# Patient Record
Sex: Female | Born: 1955 | Race: White | Hispanic: No | Marital: Married | Smoking: Never smoker
Health system: Southern US, Community
[De-identification: ages and names within clinical notes are randomized; demographics above are authoritative.]

## PROBLEM LIST (undated history)

## (undated) DIAGNOSIS — F329 Major depressive disorder, single episode, unspecified: Secondary | ICD-10-CM

## (undated) DIAGNOSIS — M170 Bilateral primary osteoarthritis of knee: Secondary | ICD-10-CM

## (undated) DIAGNOSIS — I10 Essential (primary) hypertension: Secondary | ICD-10-CM

## (undated) DIAGNOSIS — E669 Obesity, unspecified: Secondary | ICD-10-CM

## (undated) DIAGNOSIS — N6310 Unspecified lump in the right breast, unspecified quadrant: Secondary | ICD-10-CM

## (undated) DIAGNOSIS — J3089 Other allergic rhinitis: Secondary | ICD-10-CM

## (undated) DIAGNOSIS — F32A Depression, unspecified: Secondary | ICD-10-CM

## (undated) DIAGNOSIS — S060X0A Concussion without loss of consciousness, initial encounter: Secondary | ICD-10-CM

## (undated) DIAGNOSIS — E785 Hyperlipidemia, unspecified: Secondary | ICD-10-CM

## (undated) DIAGNOSIS — M2550 Pain in unspecified joint: Secondary | ICD-10-CM

## (undated) DIAGNOSIS — M79671 Pain in right foot: Secondary | ICD-10-CM

## (undated) DIAGNOSIS — J45909 Unspecified asthma, uncomplicated: Secondary | ICD-10-CM

## (undated) DIAGNOSIS — Z1211 Encounter for screening for malignant neoplasm of colon: Secondary | ICD-10-CM

## (undated) DIAGNOSIS — H8110 Benign paroxysmal vertigo, unspecified ear: Secondary | ICD-10-CM

## (undated) HISTORY — DX: Unspecified lump in the right breast, unspecified quadrant: N63.10

## (undated) HISTORY — DX: Bilateral primary osteoarthritis of knee: M17.0

## (undated) HISTORY — DX: Depression, unspecified: F32.A

## (undated) HISTORY — DX: Obesity, unspecified: E66.9

## (undated) HISTORY — PX: OTHER SURGICAL HISTORY: SHX169

## (undated) HISTORY — DX: Other allergic rhinitis: J30.89

## (undated) HISTORY — DX: Pain in unspecified joint: M25.50

## (undated) HISTORY — DX: Encounter for screening for malignant neoplasm of colon: Z12.11

## (undated) HISTORY — DX: Major depressive disorder, single episode, unspecified: F32.9

## (undated) HISTORY — DX: Essential (primary) hypertension: I10

## (undated) HISTORY — DX: Benign paroxysmal vertigo, unspecified ear: H81.10

## (undated) HISTORY — DX: Concussion without loss of consciousness, initial encounter: S06.0X0A

## (undated) HISTORY — PX: ROTATOR CUFF REPAIR: SHX139

## (undated) HISTORY — DX: Pain in right foot: M79.671

## (undated) HISTORY — PX: BREAST BIOPSY: SHX20

## (undated) HISTORY — PX: TOTAL KNEE ARTHROPLASTY: SHX125

## (undated) HISTORY — DX: Unspecified asthma, uncomplicated: J45.909

## (undated) HISTORY — DX: Hyperlipidemia, unspecified: E78.5

---

## 1999-09-20 ENCOUNTER — Encounter (INDEPENDENT_AMBULATORY_CARE_PROVIDER_SITE_OTHER): Payer: Self-pay

## 1999-09-20 ENCOUNTER — Other Ambulatory Visit: Admission: RE | Admit: 1999-09-20 | Discharge: 1999-09-20 | Payer: Self-pay | Admitting: Gynecology

## 1999-12-25 ENCOUNTER — Emergency Department (HOSPITAL_COMMUNITY): Admission: EM | Admit: 1999-12-25 | Discharge: 1999-12-25 | Payer: Self-pay | Admitting: Emergency Medicine

## 1999-12-25 ENCOUNTER — Encounter: Payer: Self-pay | Admitting: Emergency Medicine

## 2002-06-03 ENCOUNTER — Other Ambulatory Visit: Admission: RE | Admit: 2002-06-03 | Discharge: 2002-06-03 | Payer: Self-pay | Admitting: Obstetrics and Gynecology

## 2003-06-08 ENCOUNTER — Other Ambulatory Visit: Admission: RE | Admit: 2003-06-08 | Discharge: 2003-06-08 | Payer: Self-pay | Admitting: Obstetrics and Gynecology

## 2004-01-24 ENCOUNTER — Ambulatory Visit: Payer: Self-pay | Admitting: Internal Medicine

## 2004-03-16 ENCOUNTER — Ambulatory Visit: Payer: Self-pay | Admitting: Internal Medicine

## 2004-03-22 ENCOUNTER — Ambulatory Visit: Payer: Self-pay | Admitting: Internal Medicine

## 2004-04-04 ENCOUNTER — Ambulatory Visit: Payer: Self-pay | Admitting: Internal Medicine

## 2004-06-19 ENCOUNTER — Ambulatory Visit: Payer: Self-pay | Admitting: Internal Medicine

## 2004-07-26 ENCOUNTER — Other Ambulatory Visit: Admission: RE | Admit: 2004-07-26 | Discharge: 2004-07-26 | Payer: Self-pay | Admitting: Obstetrics and Gynecology

## 2005-01-25 ENCOUNTER — Ambulatory Visit: Payer: Self-pay | Admitting: Internal Medicine

## 2005-03-01 ENCOUNTER — Ambulatory Visit: Payer: Self-pay | Admitting: Internal Medicine

## 2005-04-05 ENCOUNTER — Ambulatory Visit: Payer: Self-pay | Admitting: Internal Medicine

## 2005-05-24 ENCOUNTER — Ambulatory Visit: Payer: Self-pay | Admitting: Internal Medicine

## 2005-09-27 ENCOUNTER — Ambulatory Visit: Payer: Self-pay | Admitting: Internal Medicine

## 2005-10-11 ENCOUNTER — Ambulatory Visit: Payer: Self-pay | Admitting: Internal Medicine

## 2005-10-17 ENCOUNTER — Ambulatory Visit: Payer: Self-pay | Admitting: Family Medicine

## 2005-12-17 ENCOUNTER — Ambulatory Visit: Payer: Self-pay | Admitting: Internal Medicine

## 2006-01-24 ENCOUNTER — Ambulatory Visit: Payer: Self-pay | Admitting: Internal Medicine

## 2006-03-01 ENCOUNTER — Ambulatory Visit: Payer: Self-pay | Admitting: Internal Medicine

## 2006-03-29 ENCOUNTER — Ambulatory Visit: Payer: Self-pay | Admitting: Internal Medicine

## 2006-07-04 ENCOUNTER — Ambulatory Visit: Payer: Self-pay | Admitting: Internal Medicine

## 2006-08-06 ENCOUNTER — Ambulatory Visit: Payer: Self-pay | Admitting: Internal Medicine

## 2006-09-17 DIAGNOSIS — J309 Allergic rhinitis, unspecified: Secondary | ICD-10-CM | POA: Insufficient documentation

## 2006-09-22 DIAGNOSIS — R197 Diarrhea, unspecified: Secondary | ICD-10-CM | POA: Insufficient documentation

## 2006-09-23 ENCOUNTER — Telehealth: Payer: Self-pay | Admitting: Internal Medicine

## 2006-09-26 ENCOUNTER — Ambulatory Visit: Payer: Self-pay | Admitting: Internal Medicine

## 2006-09-26 DIAGNOSIS — I10 Essential (primary) hypertension: Secondary | ICD-10-CM | POA: Insufficient documentation

## 2006-09-26 DIAGNOSIS — F329 Major depressive disorder, single episode, unspecified: Secondary | ICD-10-CM | POA: Insufficient documentation

## 2006-09-26 DIAGNOSIS — N951 Menopausal and female climacteric states: Secondary | ICD-10-CM | POA: Insufficient documentation

## 2006-09-26 LAB — CONVERTED CEMR LAB
ALT: 35 units/L (ref 0–35)
AST: 31 units/L (ref 0–37)
Albumin: 2.9 g/dL — ABNORMAL LOW (ref 3.5–5.2)
Alkaline Phosphatase: 66 units/L (ref 39–117)
Basophils Absolute: 0 10*3/uL (ref 0.0–0.1)
Basophils Relative: 0.8 % (ref 0.0–1.0)
Bilirubin, Direct: 0.2 mg/dL (ref 0.0–0.3)
Eosinophils Absolute: 0.1 10*3/uL (ref 0.0–0.6)
Eosinophils Relative: 2.6 % (ref 0.0–5.0)
HCT: 35.5 % — ABNORMAL LOW (ref 36.0–46.0)
Hemoglobin: 12.5 g/dL (ref 12.0–15.0)
Lymphocytes Relative: 36.5 % (ref 12.0–46.0)
MCHC: 35.1 g/dL (ref 30.0–36.0)
MCV: 86.8 fL (ref 78.0–100.0)
Monocytes Absolute: 0.5 10*3/uL (ref 0.2–0.7)
Monocytes Relative: 10 % (ref 3.0–11.0)
Neutro Abs: 2.6 10*3/uL (ref 1.4–7.7)
Neutrophils Relative %: 50.1 % (ref 43.0–77.0)
Platelets: 245 10*3/uL (ref 150–400)
RBC: 4.09 M/uL (ref 3.87–5.11)
RDW: 12.2 % (ref 11.5–14.6)
Total Bilirubin: 0.9 mg/dL (ref 0.3–1.2)
Total Protein: 6.4 g/dL (ref 6.0–8.3)
WBC: 5.1 10*3/uL (ref 4.5–10.5)

## 2006-09-27 DIAGNOSIS — M25569 Pain in unspecified knee: Secondary | ICD-10-CM | POA: Insufficient documentation

## 2006-09-30 ENCOUNTER — Ambulatory Visit: Payer: Self-pay | Admitting: Internal Medicine

## 2006-12-09 ENCOUNTER — Ambulatory Visit: Payer: Self-pay | Admitting: Internal Medicine

## 2006-12-31 ENCOUNTER — Ambulatory Visit: Payer: Self-pay | Admitting: Internal Medicine

## 2006-12-31 DIAGNOSIS — L408 Other psoriasis: Secondary | ICD-10-CM | POA: Insufficient documentation

## 2006-12-31 DIAGNOSIS — E785 Hyperlipidemia, unspecified: Secondary | ICD-10-CM | POA: Insufficient documentation

## 2007-01-30 ENCOUNTER — Encounter: Payer: Self-pay | Admitting: Internal Medicine

## 2007-02-07 ENCOUNTER — Ambulatory Visit: Payer: Self-pay | Admitting: Internal Medicine

## 2007-02-21 ENCOUNTER — Ambulatory Visit: Payer: Self-pay | Admitting: Internal Medicine

## 2007-02-21 DIAGNOSIS — J069 Acute upper respiratory infection, unspecified: Secondary | ICD-10-CM | POA: Insufficient documentation

## 2007-03-11 ENCOUNTER — Telehealth (INDEPENDENT_AMBULATORY_CARE_PROVIDER_SITE_OTHER): Payer: Self-pay | Admitting: *Deleted

## 2007-03-27 ENCOUNTER — Ambulatory Visit: Payer: Self-pay | Admitting: Internal Medicine

## 2007-04-21 ENCOUNTER — Telehealth: Payer: Self-pay | Admitting: Internal Medicine

## 2007-05-16 ENCOUNTER — Ambulatory Visit: Payer: Self-pay | Admitting: Internal Medicine

## 2007-06-16 ENCOUNTER — Ambulatory Visit: Payer: Self-pay | Admitting: Internal Medicine

## 2007-06-24 ENCOUNTER — Encounter: Payer: Self-pay | Admitting: Internal Medicine

## 2007-06-24 ENCOUNTER — Telehealth: Payer: Self-pay | Admitting: Internal Medicine

## 2007-08-08 ENCOUNTER — Ambulatory Visit: Payer: Self-pay | Admitting: Internal Medicine

## 2007-08-08 DIAGNOSIS — M549 Dorsalgia, unspecified: Secondary | ICD-10-CM | POA: Insufficient documentation

## 2007-09-18 ENCOUNTER — Ambulatory Visit: Payer: Self-pay | Admitting: Internal Medicine

## 2007-09-18 DIAGNOSIS — R609 Edema, unspecified: Secondary | ICD-10-CM | POA: Insufficient documentation

## 2007-09-25 ENCOUNTER — Encounter: Payer: Self-pay | Admitting: Internal Medicine

## 2007-09-25 ENCOUNTER — Ambulatory Visit: Payer: Self-pay

## 2007-10-15 ENCOUNTER — Ambulatory Visit: Payer: Self-pay | Admitting: Internal Medicine

## 2007-10-15 DIAGNOSIS — I83229 Varicose veins of left lower extremity with both ulcer of unspecified site and inflammation: Secondary | ICD-10-CM

## 2007-10-15 DIAGNOSIS — I83219 Varicose veins of right lower extremity with both ulcer of unspecified site and inflammation: Secondary | ICD-10-CM | POA: Insufficient documentation

## 2007-10-15 DIAGNOSIS — L97919 Non-pressure chronic ulcer of unspecified part of right lower leg with unspecified severity: Secondary | ICD-10-CM | POA: Insufficient documentation

## 2007-10-15 DIAGNOSIS — L97929 Non-pressure chronic ulcer of unspecified part of left lower leg with unspecified severity: Secondary | ICD-10-CM

## 2008-01-12 ENCOUNTER — Telehealth: Payer: Self-pay | Admitting: Internal Medicine

## 2008-01-20 ENCOUNTER — Ambulatory Visit: Payer: Self-pay | Admitting: Internal Medicine

## 2008-03-31 ENCOUNTER — Telehealth: Payer: Self-pay | Admitting: Internal Medicine

## 2008-03-31 ENCOUNTER — Emergency Department (HOSPITAL_COMMUNITY): Admission: EM | Admit: 2008-03-31 | Discharge: 2008-03-31 | Payer: Self-pay | Admitting: Emergency Medicine

## 2008-04-01 ENCOUNTER — Ambulatory Visit: Payer: Self-pay | Admitting: Internal Medicine

## 2008-04-01 DIAGNOSIS — S060X0A Concussion without loss of consciousness, initial encounter: Secondary | ICD-10-CM

## 2008-04-01 DIAGNOSIS — M542 Cervicalgia: Secondary | ICD-10-CM | POA: Insufficient documentation

## 2008-04-01 HISTORY — DX: Concussion without loss of consciousness, initial encounter: S06.0X0A

## 2008-06-02 ENCOUNTER — Ambulatory Visit: Payer: Self-pay | Admitting: Internal Medicine

## 2008-08-02 ENCOUNTER — Ambulatory Visit: Payer: Self-pay | Admitting: Internal Medicine

## 2008-08-02 DIAGNOSIS — H1045 Other chronic allergic conjunctivitis: Secondary | ICD-10-CM | POA: Insufficient documentation

## 2008-09-14 ENCOUNTER — Telehealth (INDEPENDENT_AMBULATORY_CARE_PROVIDER_SITE_OTHER): Payer: Self-pay | Admitting: *Deleted

## 2008-09-15 ENCOUNTER — Ambulatory Visit: Payer: Self-pay | Admitting: Internal Medicine

## 2008-09-15 LAB — CONVERTED CEMR LAB
ALT: 27 units/L (ref 0–35)
AST: 22 units/L (ref 0–37)
Alkaline Phosphatase: 62 units/L (ref 39–117)
Basophils Relative: 1.9 % (ref 0.0–3.0)
Bilirubin Urine: NEGATIVE
Bilirubin, Direct: 0 mg/dL (ref 0.0–0.3)
Calcium: 8.5 mg/dL (ref 8.4–10.5)
Cholesterol: 222 mg/dL — ABNORMAL HIGH (ref 0–200)
Creatinine, Ser: 0.6 mg/dL (ref 0.4–1.2)
Eosinophils Relative: 6 % — ABNORMAL HIGH (ref 0.0–5.0)
Hemoglobin: 11.9 g/dL — ABNORMAL LOW (ref 12.0–15.0)
Lymphocytes Relative: 30.1 % (ref 12.0–46.0)
Monocytes Relative: 9.7 % (ref 3.0–12.0)
Neutro Abs: 2.9 10*3/uL (ref 1.4–7.7)
Neutrophils Relative %: 52.3 % (ref 43.0–77.0)
Nitrite: NEGATIVE
Protein, U semiquant: NEGATIVE
RBC: 3.88 M/uL (ref 3.87–5.11)
Sodium: 142 meq/L (ref 135–145)
Total CHOL/HDL Ratio: 4
Total Protein: 7.1 g/dL (ref 6.0–8.3)
Triglycerides: 88 mg/dL (ref 0.0–149.0)
Urobilinogen, UA: 0.2
VLDL: 17.6 mg/dL (ref 0.0–40.0)
WBC: 5.4 10*3/uL (ref 4.5–10.5)

## 2008-09-22 ENCOUNTER — Ambulatory Visit: Payer: Self-pay | Admitting: Internal Medicine

## 2008-10-13 ENCOUNTER — Telehealth: Payer: Self-pay | Admitting: Internal Medicine

## 2008-12-30 ENCOUNTER — Ambulatory Visit: Payer: Self-pay | Admitting: Internal Medicine

## 2009-03-07 ENCOUNTER — Ambulatory Visit: Payer: Self-pay | Admitting: Internal Medicine

## 2009-04-12 ENCOUNTER — Encounter: Payer: Self-pay | Admitting: Internal Medicine

## 2009-05-27 ENCOUNTER — Ambulatory Visit: Payer: Self-pay | Admitting: Internal Medicine

## 2009-05-27 ENCOUNTER — Telehealth: Payer: Self-pay | Admitting: Internal Medicine

## 2009-05-27 DIAGNOSIS — H53429 Scotoma of blind spot area, unspecified eye: Secondary | ICD-10-CM | POA: Insufficient documentation

## 2009-05-30 ENCOUNTER — Encounter: Payer: Self-pay | Admitting: Internal Medicine

## 2009-06-06 ENCOUNTER — Ambulatory Visit: Payer: Self-pay | Admitting: Internal Medicine

## 2009-06-06 DIAGNOSIS — H47019 Ischemic optic neuropathy, unspecified eye: Secondary | ICD-10-CM | POA: Insufficient documentation

## 2009-06-07 ENCOUNTER — Telehealth: Payer: Self-pay | Admitting: Internal Medicine

## 2009-06-10 ENCOUNTER — Encounter: Payer: Self-pay | Admitting: Internal Medicine

## 2009-06-22 ENCOUNTER — Encounter: Payer: Self-pay | Admitting: Internal Medicine

## 2009-07-04 ENCOUNTER — Telehealth: Payer: Self-pay | Admitting: Internal Medicine

## 2009-07-05 ENCOUNTER — Ambulatory Visit: Payer: Self-pay | Admitting: Internal Medicine

## 2009-08-08 ENCOUNTER — Ambulatory Visit: Payer: Self-pay | Admitting: Family Medicine

## 2009-09-05 ENCOUNTER — Ambulatory Visit: Payer: Self-pay | Admitting: Internal Medicine

## 2009-10-12 ENCOUNTER — Ambulatory Visit: Payer: Self-pay | Admitting: Family Medicine

## 2009-12-19 ENCOUNTER — Ambulatory Visit: Payer: Self-pay | Admitting: Family Medicine

## 2010-01-13 ENCOUNTER — Ambulatory Visit: Payer: Self-pay | Admitting: Internal Medicine

## 2010-01-18 ENCOUNTER — Ambulatory Visit: Payer: Self-pay | Admitting: Internal Medicine

## 2010-01-18 DIAGNOSIS — J45991 Cough variant asthma: Secondary | ICD-10-CM | POA: Insufficient documentation

## 2010-01-24 ENCOUNTER — Telehealth: Payer: Self-pay | Admitting: Internal Medicine

## 2010-03-03 ENCOUNTER — Ambulatory Visit
Admission: RE | Admit: 2010-03-03 | Discharge: 2010-03-03 | Payer: Self-pay | Source: Home / Self Care | Attending: Internal Medicine | Admitting: Internal Medicine

## 2010-03-03 DIAGNOSIS — R059 Cough, unspecified: Secondary | ICD-10-CM | POA: Insufficient documentation

## 2010-03-03 DIAGNOSIS — R05 Cough: Secondary | ICD-10-CM | POA: Insufficient documentation

## 2010-03-03 HISTORY — DX: Cough, unspecified: R05.9

## 2010-03-20 ENCOUNTER — Encounter: Payer: Self-pay | Admitting: Internal Medicine

## 2010-03-26 LAB — CONVERTED CEMR LAB: Pap Smear: NORMAL

## 2010-03-30 NOTE — Progress Notes (Signed)
Summary: need results  Phone Note Call from Patient Call back at Home Phone [phone] Call back at Work Phone [phone]   Caller: Patient----voicemail Reason for Call: Lab or Test Results Summary of Call: was seen yesterday. Need her sed rate results. Initial call taken by: Warnell Forester,  June 07, 2009 1:49 PM  Follow-up for Phone Call        pt informed -per dr Lovell Sheehan- no prednisone, probably what eye doc thought it was, Follow-up by: Willy Eddy, LPN,  June 07, 2009 3:39 PM

## 2010-03-30 NOTE — Letter (Signed)
Summary: Eye Exam/[facility]  [facility]   Imported By: Maryln Gottron 06/08/2009 14:45:43  _____________________________________________________________________  External Attachment:    Type:   Image     Comment:   External Document

## 2010-03-30 NOTE — Assessment & Plan Note (Signed)
Summary: 2 MNTH ROV//SLM   Vital Signs:  Patient profile:   55 year old female Height:      66 inches Weight:      196 pounds BMI:     31.75 Temp:     98.2 degrees F oral Pulse rate:   72 / minute Resp:     14 per minute BP sitting:   122 / 80  (left arm)  Vitals Entered By: Willy Eddy, LPN (March 07, 2009 8:30 AM) CC: roa after changing bp meds to ben icar due to insurance , Hypertension Management   CC:  roa after changing bp meds to ben icar due to insurance  and Hypertension Management.  History of Present Illness: follow up visit blood pressure is good and she has lost weight her complaint todya is low back pain that is increased with she moves  ( turning over and upon arising) the pain improves with activity no injury, has lifting grand baby  Hypertension History:      She denies headache, chest pain, palpitations, dyspnea with exertion, orthopnea, PND, peripheral edema, visual symptoms, neurologic problems, syncope, and side effects from treatment.        Positive major cardiovascular risk factors include hyperlipidemia and hypertension.  Negative major cardiovascular risk factors include female age less than 6 years old and non-tobacco-user status.     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 Once Daily 2)  Multivitamins   Tabs (Multiple Vitamin) .... Once Daily 3)  Benicar Hct 20-12.5 Mg Tabs (Olmesartan Medoxomil-Hctz) .... One By Mouth Dail 4)  Lovaza 1 Gm  Caps (Omega-3-Acid Ethyl Esters) .... Occassionally 5)  Optivar 0.05 %  Soln (Azelastine Hcl) .... One Qtts Ou Bid 6)  Diprolene Af 0.05 %  Crea (Aug Betamethasone Dipropionate) .... Apply To Skin in Afected Areas 7)  Caltrate 600+d Plus 600-400 Mg-Unit  Chew (Calcium Carbonate-Vit D-Min) .... Once Daily 8)  Vitamin E 100 Unit/gm  Crea (Vitamin E) .... Once Daily 9)  Ativan 0.5 Mg  Tabs (Lorazepam) .... One By Mouth Q 6 Hour As Needed 10)  Naprelan 375 Mg  Tb24 (Naproxen Sodium) .... Two Daily 11)  Elestat 0.05 %  Soln (Epinastine Hcl) .... Use As Directed To Eyes 12)  Patanol 0.1 %  Soln (Olopatadine Hcl) .... One Qtts Ou Bid 13)  Nasonex 50 Mcg/act  Susp (Mometasone Furoate) .... One Spray Two Times A Day Q Nare 14)  Lexapro 10 Mg Tabs (Escitalopram Oxalate) .... 1/2 By Mouth Daily 15)  Asmanex 120 Metered Doses 220 Mcg/inh Aepb (Mometasone Furoate) .... One Puf Dialy 16)  Proventil Hfa 108 (90 Base) Mcg/act  Aers (Albuterol Sulfate) .... 2 Puff  By Mouth Q4  Current Medications (verified): 1)  Prempro 0.625-5 Mg  Tabs (Conj Estrog-Medroxyprogest Ace) .... Once Daily 2)  Multivitamins   Tabs (Multiple Vitamin) .... Once Daily 3)  Benicar Hct 20-12.5 Mg Tabs (Olmesartan Medoxomil-Hctz) .... One By Mouth Dail 4)  Lovaza 1 Gm  Caps (Omega-3-Acid Ethyl Esters) .... Occassionally 5)  Optivar 0.05 %  Soln (Azelastine Hcl) .... One Qtts Ou Bid 6)  Diprolene Af 0.05 %  Crea (Aug Betamethasone Dipropionate) .... Apply To Skin in Afected Areas 7)  Caltrate 600+d Plus 600-400 Mg-Unit  Chew (Calcium Carbonate-Vit D-Min) .... Once Daily 8)  Vitamin E 100 Unit/gm  Crea (Vitamin E) .... Once  Daily 9)  Ativan 0.5 Mg  Tabs (Lorazepam) .... One By Mouth Q 6 Hour As Needed 10)  Elestat 0.05 %  Soln (Epinastine Hcl) .... Use As Directed To Eyes 11)  Patanol 0.1 %  Soln (Olopatadine Hcl) .... One Qtts Ou Bid 12)  Nasonex 50 Mcg/act  Susp (Mometasone Furoate) .... One Spray Two Times A Day Q Nare 13)  Lexapro 10 Mg Tabs (Escitalopram Oxalate) .... 1/2 By Mouth Daily 14)  Asmanex 120 Metered Doses 220 Mcg/inh Aepb (Mometasone Furoate) .... One Puf Dialy 15)  Proventil Hfa 108 (90 Base) Mcg/act  Aers (Albuterol Sulfate) .... 2 Puff By Mouth Q4 16)  Chlorzoxazone 500 Mg Tabs (Chlorzoxazone) .... One By Mouth Bid 17)  Diclofenac Potassium 50 Mg Tabs (Diclofenac Potassium) .... One By Mouth Two Times A Day  Allergies (verified): No Known Drug Allergies  Past History:  Family History: Last updated: 09/26/2006 Family History of Arthritis Family History High cholesterol  Social History: Last updated: 09/26/2006 Occupation: Married Never Smoked  Risk Factors: Smoking Status: never (09/26/2006)  Past medical, surgical, family and social histories (including risk factors) reviewed, and no changes noted (except as noted below).  Past Medical History: Reviewed history from 12/31/2006 and no changes required. Allergic rhinitis Depression Hypertension Hyperlipidemia  Past Surgical History: Reviewed history from 09/26/2006 and no changes required. Caesarean section x 2 Rotator cuff repair  Family History: Reviewed history from 09/26/2006 and no changes required. Family History of Arthritis Family History High cholesterol  Social History: Reviewed history from 09/26/2006 and no changes required. Occupation: Married Never Smoked  Review of Systems  The patient denies anorexia, fever, weight loss, weight gain, vision loss, decreased hearing, hoarseness, chest pain, syncope, dyspnea on exertion, peripheral edema, prolonged cough, headaches, hemoptysis, abdominal pain,  melena, hematochezia, severe indigestion/heartburn, hematuria, incontinence, genital sores, muscle weakness, suspicious skin lesions, transient blindness, difficulty walking, depression, unusual weight change, abnormal bleeding, enlarged lymph nodes, angioedema, and breast masses.         back pain  Physical Exam  General:  Well-developed,well-nourished,in no acute distress; alert,appropriate and cooperative throughout examination Head:  normocephalic, no abnormalities observed, and no abnormalities palpated.   Eyes:  pupils equal and pupils round.   Nose:  nasal dischargemucosal pallor, mucosal erythema, and mucosal edema.   Mouth:  Oral mucosa and oropharynx without lesions or exudates.  Teeth in good repair. Neck:  No deformities, masses, or tenderness noted. Lungs:  Normal respiratory effort, chest expands symmetrically. Lungs are clear to auscultation, no crackles or wheezes. Heart:  Normal rate and regular rhythm. S1 and S2 normal without gallop, murmur, click, rub or other extra sounds. Abdomen:  Bowel sounds positive,abdomen soft and non-tender without masses, organomegaly or hernias noted. Msk:  lumbar lordosis and trigger point tenderness.   Neurologic:  alert & oriented X3  and DTRs symmetrical and normal.     Impression & Recommendations:  Problem # 1:  BACK PAIN, LEFT (ICD-724.5) Assessment Deteriorated  with spasms  The following medications were removed from the medication list:    Naprelan 375 Mg Tb24 (Naproxen sodium) .Marland Kitchen..Marland Kitchen Two daily Her updated medication list for this problem includes:    Chlorzoxazone 500 Mg Tabs (Chlorzoxazone) ..... One by mouth bid    Diclofenac Potassium 50 Mg Tabs (Diclofenac potassium) ..... One by mouth two times a day  Discussed use of moist heat or ice, modified activities, medications, and stretching/strengthening exercises. Back care instructions given. To be seen in 2 weeks if no improvement; sooner if worsening of symptoms.   Problem  # 2:  HYPERLIPIDEMIA (ICD-272.4) Assessment: Unchanged  Her updated medication list for this problem includes:    Lovaza 1 Gm Caps (Omega-3-acid ethyl esters) ..... Occassionally  Labs Reviewed: SGOT: 22 (09/15/2008)   SGPT: 27 (09/15/2008)  Prior 10 Yr Risk Heart Disease: Not enough information (03/27/2007)   HDL:62.40 (09/15/2008)  Chol:222 (09/15/2008)  Trig:88.0 (09/15/2008)  Problem # 3:  HYPERTENSION (ICD-401.9) very god results with benicar Her updated medication list for this problem includes:    Benicar Hct 20-12.5 Mg Tabs (Olmesartan medoxomil-hctz) ..... One by mouth dail  BP today: 122/80 Prior BP: 124/80 (12/30/2008)  Prior 10 Yr Risk Heart Disease: Not enough information (03/27/2007)  Labs Reviewed: K+: 3.5 (09/15/2008) Creat: : 0.6 (09/15/2008)   Chol: 222 (09/15/2008)   HDL: 62.40 (09/15/2008)   TG: 88.0 (09/15/2008)  Complete Medication List: 1)  Prempro 0.625-5 Mg Tabs (Conj estrog-medroxyprogest ace) .... Once daily 2)  Multivitamins Tabs (Multiple vitamin) .... Once daily 3)  Benicar Hct 20-12.5 Mg Tabs (Olmesartan medoxomil-hctz) .... One by mouth dail 4)  Lovaza 1 Gm Caps (Omega-3-acid ethyl esters) .... Occassionally 5)  Optivar 0.05 % Soln (Azelastine hcl) .... One qtts ou bid 6)  Diprolene Af 0.05 % Crea (Aug betamethasone dipropionate) .... Apply to skin in afected areas 7)  Caltrate 600+d Plus 600-400 Mg-unit Chew (Calcium carbonate-vit d-min) .... Once daily 8)  Vitamin E 100 Unit/gm Crea (Vitamin e) .... Once daily 9)  Ativan 0.5 Mg Tabs (Lorazepam) .... One by mouth q 6 hour as needed 10)  Elestat 0.05 % Soln (Epinastine hcl) .... Use as directed to eyes 11)  Patanol 0.1 % Soln (Olopatadine hcl) .... One qtts ou bid 12)  Nasonex 50 Mcg/act Susp (Mometasone furoate) .... One spray two times a day q nare 13)  Lexapro 10 Mg Tabs (Escitalopram oxalate) .... 1/2 by mouth daily 14)  Asmanex 120 Metered Doses 220 Mcg/inh Aepb (Mometasone furoate) ....  One puf dialy 15)  Proventil Hfa 108 (90 Base) Mcg/act Aers (Albuterol sulfate) .... 2 puff by mouth q4 16)  Chlorzoxazone 500 Mg Tabs (Chlorzoxazone) .... One by mouth bid 17)  Diclofenac Potassium 50 Mg Tabs (Diclofenac potassium) .... One by mouth two times a day  Hypertension Assessment/Plan:      The patient's hypertensive risk group is category B: At least one risk factor (excluding diabetes) with no target organ damage.  Today's blood pressure is 122/80.  Her blood pressure goal is < 140/90.  Patient Instructions: 1)  Please schedule a follow-up appointment in 3 months. Prescriptions: DICLOFENAC POTASSIUM 50 MG TABS (DICLOFENAC POTASSIUM) one by mouth two times a day  #30 x 0   Entered and Authorized by:   Stacie Glaze MD   Signed by:   Stacie Glaze MD on  03/07/2009   Method used:   Electronically to        [id number] (retail)       [address]       [place], [place]  [address]       Ph: [phone] or [phone]       Fax: [phone]   RxID:   [id number] CHLORZOXAZONE 500 MG TABS (CHLORZOXAZONE) one by mouth BID  #30 x 0   Entered and Authorized by:   Stacie Glaze MD   Signed by:   Stacie Glaze MD on 03/07/2009   Method used:   Electronically to        [id number] (retail)       [address]       [place], [place]  [address]       Ph: [phone] or [phone]       Fax: [phone]   RxID:   [id number]

## 2010-03-30 NOTE — Assessment & Plan Note (Signed)
Summary: F/U ON COUGH // RS   Vital Signs:  Patient profile:   55 year old female Height:      66 inches Weight:      202 pounds BMI:     32.72 Temp:     98.3 degrees F oral Pulse rate:   72 / minute Resp:     14 per minute BP sitting:   120 / 74  (left arm)  Vitals Entered By: Willy Eddy, LPN (January 18, 2010 3:57 PM) CC: f/u cough - had ch est xray, Hypertension Management Is Patient Diabetic? No   Primary Care Azalya Galyon:  Stacie Glaze MD  CC:  f/u cough - had ch est xray and Hypertension Management.  History of Present Illness: the pt has cough varient asthma the  pt has allergies that has been severe thje pt responded to qvar trial with decreased cough she presented off the qvar with increased cough xray was negative, she has not fever of chills    Hypertension History:      She denies headache, chest pain, palpitations, dyspnea with exertion, orthopnea, PND, peripheral edema, visual symptoms, neurologic problems, syncope, and side effects from treatment.        Positive major cardiovascular risk factors include female age 17 years old or older, hyperlipidemia, and hypertension.  Negative major cardiovascular risk factors include non-tobacco-user status.     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) 9)  Edema  (ICD-782.3) 10)  Back Pain, Left  (ICD-724.5) 11)  Uri  (ICD-465.9) 12)  Psoriasis, 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) 13)  Foot Pain, Left  (ICD-729.5) 14)  Hyperlipidemia  (ICD-272.4) 15)   Tendinitis  (ICD-726.90) 16)  Knee Pain, Acute  (ICD-719.46) 17)  Diarrhea, Presumed Infectious Origin  (ICD-009.3) 18)  Hypertension  (ICD-401.9) 19)  Depression  (ICD-311) 20)  Menopause  (ICD-627.2) 21)  Allergic Rhinitis  (ICD-477.9)  Current Problems (verified): 1)  Cough, Chronic  (ICD-786.2) 2)  Acute Bronchitis  (ICD-466.0) 3)  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) 4)  Scotoma of Blind Spot Area in [employer]  [phone]) 5)  Asthma, Unspecified, Unspecified Status  (ICD-493.90) 6)  Health Maintenance Exam  (ICD-V70.0) 7)  Conjunctivitis, Allergic, Chronic  (ICD-372.14) 8)  Neck and Back Pain  (ICD-723.1) 9)  Concussion With No Loss of Consciousness  (ICD-850.0) 10)  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 Once Daily 2)  Benicar Hct 20-12.5 Mg Tabs (Olmesartan Medoxomil-Hctz) .... One By Mouth Dail 3)  Optivar 0.05 %  Soln (Azelastine  Hcl) .... One Qtts Ou Bid 4)  Diprolene Af 0.05 %  Crea (Aug Betamethasone Dipropionate) .... Apply To Skin in Afected Areas 5)  Caltrate 600+d Plus 600-400 Mg-Unit  Chew (Calcium Carbonate-Vit D-Min) .... Once Daily 6)  Vitamin E 100 Unit/gm  Crea (Vitamin E) .... Once Daily 7)  Elestat 0.05 %  Soln (Epinastine Hcl) .... Use As Directed To Eyes 8)  Patanol 0.1 %  Soln (Olopatadine Hcl) .... One Qtts Ou Bid 9)  Nasonex 50 Mcg/act  Susp (Mometasone Furoate) .... One Spray Two Times A Day Q Nare 10)  Lexapro 10 Mg  Tabs (Escitalopram Oxalate) .... 1/2 By Mouth Daily 11)  Qvar 80 Mcg/act Aers (Beclomethasone Dipropionate) .... One Puff Two Times A Day 12)  Proventil Hfa 108 (90 Base) Mcg/act  Aers (Albuterol Sulfate) .... 2 Puff By Mouth Q4 13)  Chlorzoxazone 500 Mg Tabs (Chlorzoxazone) .... One By Mouth Bid 14)  Diclofenac Potassium 50 Mg Tabs (Diclofenac Potassium) .... One By Mouth Two Times A Day 15)  Methylprednisolone 8 Mg Tabs (Methylprednisolone) .... One By Mouth Two Times A Day For 5 Days The One By Mouth Daily Fro 5 Days The 1/2 By Mouth Daily For 10 Days 16)  Hydrocodone-Homatropine 5-1.5 Mg/55ml Syrp (Hydrocodone-Homatropine) .... One Tsp By Mouth By Mouth 4-6 Hours As Needed For Cough. 17)  Phentermine Hcl 37.5 Mg Tabs (Phentermine Hcl) .... One By Mouth Daily  Current Medications (verified): 1)  Multivitamins   Tabs (Multiple Vitamin) .... Once Daily 2)  Benicar Hct 20-12.5 Mg Tabs (Olmesartan Medoxomil-Hctz) .... One By Mouth Dail 3)  Optivar 0.05 %  Soln (Azelastine Hcl) .... One Qtts Ou Bid 4)  Diprolene Af 0.05 %  Crea (Aug Betamethasone Dipropionate) .... Apply To Skin in Afected Areas 5)  Caltrate 600+d Plus 600-400 Mg-Unit  Chew (Calcium Carbonate-Vit D-Min) .... Once Daily 6)  Vitamin E 100 Unit/gm  Crea (Vitamin E) .... Once Daily 7)  Elestat 0.05 %  Soln (Epinastine Hcl) .... Use As Directed To Eyes 8)  Patanol 0.1 %  Soln (Olopatadine Hcl) .... One Qtts Ou Bid 9)  Nasonex 50 Mcg/act  Susp (Mometasone Furoate) .... One Spray Two Times A Day Q Nare 10)  Lexapro 10 Mg Tabs (Escitalopram Oxalate) .... 1/2 By Mouth Daily 11)  Qvar 80 Mcg/act Aers (Beclomethasone Dipropionate) .... One Puff Two Times A Day 12)  Proventil Hfa 108 (90 Base) Mcg/act  Aers (Albuterol Sulfate) .... 2 Puff By Mouth Q4 13)  Chlorzoxazone 500 Mg Tabs (Chlorzoxazone) .... One By Mouth Bid 14)  Phentermine Hcl 37.5 Mg Tabs (Phentermine Hcl) .... One By Mouth Daily  Allergies (verified): No Known Drug  Allergies  Past History:  Family History: Last updated: 09/26/2006 Family History of Arthritis Family History High cholesterol  Social History: Last updated: 09/26/2006 Occupation: Married Never Smoked  Risk Factors: Smoking Status: never (01/18/2010)  Past medical, surgical, family and social histories (including risk factors) reviewed, and no changes noted (except as noted below).  Past Medical History: Reviewed history from 12/31/2006 and no changes required. Allergic rhinitis Depression Hypertension Hyperlipidemia  Past Surgical History: Reviewed history from 09/26/2006 and no changes required. Caesarean section x 2 Rotator cuff repair  Family History: Reviewed history from 09/26/2006 and no changes required. Family History of Arthritis Family History High cholesterol  Social History: Reviewed history from 09/26/2006 and no changes required. Occupation: Married Never Smoked  Review of Systems       The patient complains of hoarseness and prolonged  cough.  The patient denies anorexia, fever, weight loss, weight gain, vision loss, decreased hearing, chest pain, syncope, dyspnea on exertion, peripheral edema, headaches, hemoptysis, abdominal pain, melena, hematochezia, severe indigestion/heartburn, hematuria, incontinence, genital sores, muscle weakness, suspicious skin lesions, transient blindness, difficulty walking, depression, unusual weight change, abnormal bleeding, enlarged lymph nodes, angioedema, and breast masses.    Physical Exam  General:  Well-developed,well-nourished,in no acute distress; alert,appropriate and cooperative throughout examination Head:  Normocephalic and atraumatic without obvious abnormalities. No apparent alopecia or balding. Eyes:  pupils equal and pupils round.   Ears:  R ear normal and L ear normal.   Nose:  external erythema and nasal dischargemucosal pallor.   Mouth:  posterior lymphoid hypertrophy and postnasal drip.   Neck:   No deformities, masses, or tenderness noted. Lungs:  Normal respiratory effort, chest expands symmetrically. Lungs are clear with slight end exp wheezing Heart:  Normal rate and regular rhythm. S1 and S2 normal without gallop, murmur, click, rub or other extra sounds. Abdomen:  Bowel sounds positive,abdomen soft and non-tender without masses, organomegaly or hernias noted.   Impression & Recommendations:  Problem # 1:  COUGH VARIANT ASTHMA (ICD-493.82) Assessment New  The following medications were removed from the medication list:    Methylprednisolone 8 Mg Tabs (Methylprednisolone) ..... One by mouth two times a day for 5 days the one by mouth daily fro 5 days the 1/2 by mouth daily for 10 days Her updated medication list for this problem includes:    Qvar 80 Mcg/act Aers (Beclomethasone dipropionate) ..... One puff two times a day    Proventil Hfa 108 (90 Base) Mcg/act Aers (Albuterol sulfate) .Marland Kitchen... 2 puff by mouth q4  Problem # 2:  HYPERTENSION (ICD-401.9) Assessment: Unchanged  Her updated medication list for this problem includes:    Benicar Hct 20-12.5 Mg Tabs (Olmesartan medoxomil-hctz) ..... One by mouth dail  BP today: 120/74 Prior BP: 110/78 (12/19/2009)  Prior 10 Yr Risk Heart Disease: Not enough information (03/27/2007)  Labs Reviewed: K+: 3.5 (09/15/2008) Creat: : 0.6 (09/15/2008)   Chol: 222 (09/15/2008)   HDL: 62.40 (09/15/2008)   TG: 88.0 (09/15/2008)  Problem # 3:  ALLERGIC RHINITIS (ICD-477.9) Assessment: Deteriorated  Her updated medication list for this problem includes:    Nasonex 50 Mcg/act Susp (Mometasone furoate) ..... One spray two times a day q nare  Complete Medication List: 1)  Multivitamins Tabs (Multiple vitamin) .... Once daily 2)  Benicar Hct 20-12.5 Mg Tabs (Olmesartan medoxomil-hctz) .... One by mouth dail 3)  Optivar 0.05 % Soln (Azelastine hcl) .... One qtts ou bid 4)  Diprolene Af 0.05 % Crea (Aug betamethasone dipropionate) .... Apply  to skin in afected areas 5)  Caltrate 600+d Plus 600-400 Mg-unit Chew (Calcium carbonate-vit d-min) .... Once daily 6)  Vitamin E 100 Unit/gm Crea (Vitamin e) .... Once daily 7)  Elestat 0.05 % Soln (Epinastine hcl) .... Use as directed to eyes 8)  Patanol 0.1 % Soln (Olopatadine hcl) .... One qtts ou bid 9)  Nasonex 50 Mcg/act Susp (Mometasone furoate) .... One spray two times a day q nare 10)  Lexapro 10 Mg Tabs (Escitalopram oxalate) .... 1/2 by mouth daily 11)  Qvar 80 Mcg/act Aers (Beclomethasone dipropionate) .... One puff two times a day 12)  Proventil Hfa 108 (90 Base) Mcg/act Aers (Albuterol sulfate) .... 2 puff by mouth q4 13)  Chlorzoxazone 500 Mg Tabs (Chlorzoxazone) .... One by mouth bid 14)  Phentermine Hcl 37.5 Mg Tabs (Phentermine hcl) .... One by  mouth daily  Hypertension Assessment/Plan:      The patient's hypertensive risk group is category B: At least one risk factor (excluding diabetes) with no target organ damage.  Today's blood pressure is 120/74.  Her blood pressure goal is < 140/90.  Patient Instructions: 1)  Please schedule a follow-up appointment in 4 months.   Orders Added: 1)  Est. Patient Level IV [56213]

## 2010-03-30 NOTE — Assessment & Plan Note (Signed)
Summary: FLU LIKE SYMPTOMS//SL[facility]   Vital Signs:  Patient profile:   55 year old female O2 Sat:      95 % Temp:     98.7 degrees F oral Pulse rate:   67 / minute Pulse rhythm:   regular BP sitting:   92 / 70  (left arm) Cuff size:   large  Vitals Entered By: Sid Falcon LPN (August 08, 2009 8:53 AM) CC: Cough, congestion, swollen neck glands   History of Present Illness: One week ago upper back pain-pt thought muscular.  Last Monday nasal congestion, sore throat, and cough.  Took hydrocodone cough syrup without improvement.  Cough prod of green sputum. Nonsmoker.  hx of asthma.  No daily controller meds.  Allergies (verified): No Known Drug Allergies  Past History:  Past Medical History: Last updated: 12/31/2006 Allergic rhinitis Depression Hypertension Hyperlipidemia  Review of Systems  The patient denies fever, weight loss, dyspnea on exertion, peripheral edema, prolonged cough, and hemoptysis.    Physical Exam  General:  Well-developed,well-nourished,in no acute distress; alert,appropriate and cooperative throughout examination Ears:  External ear exam shows no significant lesions or deformities.  Otoscopic examination reveals clear canals, tympanic membranes are intact bilaterally without bulging, retraction, inflammation or discharge. Hearing is grossly normal bilaterally. Mouth:  Oral mucosa and oropharynx without lesions or exudates.  Teeth in good repair. Neck:  No deformities, masses, or tenderness noted. Lungs:  Normal respiratory effort, chest expands symmetrically. Lungs are clear to auscultation, no crackles or wheezes. Heart:  Normal rate and regular rhythm. S1 and S2 normal without gallop, murmur, click, rub or other extra sounds. Extremities:  No clubbing, cyanosis, edema, or deformity noted with normal full range of motion of all joints.     Impression & Recommendations:  Problem # 1:  ACUTE BRONCHITIS (ICD-466.0) Assessment New cough med and Z-max  prescribed. Her updated medication list for this problem includes:    Asmanex 120 Metered Doses 220 Mcg/inh Aepb (Mometasone furoate) ..... One puf dialy    Proventil Hfa 108 (90 Base) Mcg/act Aers (Albuterol sulfate) .Marland Kitchen... 2 puff by mouth q4    Azithromycin 250 Mg Tabs (Azithromycin) .Marland Kitchen... 2 by mouth today then one by mouth once daily for 4 days    Hydrocodone-homatropine 5-1.5 Mg/5ml Syrp (Hydrocodone-homatropine) ..... One tsp by mouth by mouth 4-6 hours as needed for cough.  Complete Medication List: 1)  Prempro 0.625-5 Mg Tabs (Conj estrog-medroxyprogest ace) .... Once daily 2)  Multivitamins Tabs (Multiple vitamin) .... Once daily 3)  Benicar Hct 20-12.5 Mg Tabs (Olmesartan medoxomil-hctz) .... One by mouth dail 4)  Lovaza 1 Gm Caps (Omega-3-acid ethyl esters) .... Occassionally 5)  Optivar 0.05 % Soln (Azelastine hcl) .... One qtts ou bid 6)  Diprolene Af 0.05 % Crea (Aug betamethasone dipropionate) .... Apply to skin in afected areas 7)  Caltrate 600+d Plus 600-400 Mg-unit Chew (Calcium carbonate-vit d-min) .... Once daily 8)  Vitamin E 100 Unit/gm Crea (Vitamin e) .... Once daily 9)  Ativan 0.5 Mg Tabs (Lorazepam) .... One by mouth q 6 hour as needed 10)  Elestat 0.05 % Soln (Epinastine hcl) .... Use as directed to eyes 11)  Patanol 0.1 % Soln (Olopatadine hcl) .... One qtts ou bid 12)  Nasonex 50 Mcg/act Susp (Mometasone furoate) .... One spray two times a day q nare 13)  Lexapro 10 Mg Tabs (Escitalopram oxalate) .... 1/2 by mouth daily 14)  Asmanex 120 Metered Doses 220 Mcg/inh Aepb (Mometasone furoate) .... One puf dialy 15)  Proventil Hfa 108 (90 Base) Mcg/act Aers (Albuterol sulfate) .... 2 puff by mouth q4 16)  Chlorzoxazone 500 Mg Tabs (Chlorzoxazone) .... One by mouth bid 17)  Diclofenac Potassium 50 Mg Tabs (Diclofenac potassium) .... One by mouth two times a day 18)  Methylprednisolone 8 Mg Tabs (Methylprednisolone) .... One by mouth two times a day for 5 days the one by  mouth daily fro 5 days the 1/2 by mouth daily for 10 days 19)  Azithromycin 250 Mg Tabs (Azithromycin) .... 2 by mouth today then one by mouth once daily for 4 days 20)  Hydrocodone-homatropine 5-1.5 Mg/29ml Syrp (Hydrocodone-homatropine) .... One tsp by mouth by mouth 4-6 hours as needed for cough.  Patient Instructions: 1)  Acute Bronchitis symptoms for less then 10 days are not  helped by antibiotics. Take over the counter cough medications. Call if no improvement in 5-7 days, sooner if increasing cough, fever, or new symptoms ( shortness of breath, chest pain) .  Prescriptions: HYDROCODONE-HOMATROPINE 5-1.5 MG/5ML SYRP (HYDROCODONE-HOMATROPINE) one tsp by mouth by mouth 4-6 hours as needed for cough.  #120 ml x 0   Entered and Authorized by:   Evelena Peat MD   Signed by:   Evelena Peat MD on 08/08/2009   Method used:   Print then Give to Patient   RxID:   [id number] AZITHROMYCIN 250 MG TABS (AZITHROMYCIN) 2 by mouth today then one by mouth once daily for 4 days  #6 x 0   Entered and Authorized by:   Evelena Peat MD   Signed by:   Evelena Peat MD on 08/08/2009   Method used:   Electronically to        [id number] (retail)       [address]       [place], [place]  [address]       Ph: [phone]       Fax: [phone]   RxID:   [id number]

## 2010-03-30 NOTE — Assessment & Plan Note (Signed)
Summary: allergies/bmw   Vital Signs:  Patient profile:   55 year old female Height:      86 inches Weight:      196 pounds BMI:     18.70 Temp:     98.2 degrees F oral Pulse rate:   76 / minute Resp:     14 per minute BP sitting:   122 / 80  (left arm)  Vitals Entered By: Willy Eddy, LPN (Jul 05, 2009 11:20 AM) CC: c/o seasonal allergies   CC:  c/o seasonal allergies.  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) 2)  Scotoma of Blind Spot Area in [employer]  [phone]) 3)  Asthma, Unspecified, Unspecified Status  (ICD-493.90) 4)  Health Maintenance Exam  (ICD-V70.0) 5)  Conjunctivitis, Allergic, Chronic  (ICD-372.14) 6)  Neck and Back Pain  (ICD-723.1) 7)  Concussion With No Loss of Consciousness  (ICD-850.0) 8)  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) 9)  Edema  (ICD-782.3) 10)  Back Pain, Left  (ICD-724.5) 11)  Uri  (ICD-465.9) 12)  Psoriasis, 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) 13)  Foot Pain, Left  (ICD-729.5) 14)  Hyperlipidemia  (ICD-272.4) 15)  Tendinitis  (ICD-726.90) 16)  Knee Pain, Acute  (ICD-719.46) 17)  Diarrhea, Presumed Infectious Origin  (ICD-009.3) 18)  Hypertension  (ICD-401.9) 19)  Depression  (ICD-311) 20)  Menopause  (ICD-627.2) 21)  Allergic Rhinitis  (ICD-477.9)  Current Problems (verified): 1)  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) 2)  Scotoma of Blind Spot Area in [employer]  [phone]) 3)  Asthma, Unspecified, Unspecified Status  (ICD-493.90) 4)  Health Maintenance Exam  (ICD-V70.0) 5)  Conjunctivitis, Allergic, Chronic   (ICD-372.14) 6)  Neck and Back Pain  (ICD-723.1) 7)  Concussion With No Loss of Consciousness  (ICD-850.0) 8)  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 Once Daily 2)  Multivitamins   Tabs (Multiple Vitamin) .... Once Daily 3)  Benicar Hct 20-12.5 Mg Tabs (Olmesartan Medoxomil-Hctz) .... One By Mouth Dail 4)  Lovaza 1 Gm  Caps (Omega-3-Acid Ethyl Esters) .... Occassionally 5)  Optivar 0.05 %  Soln (Azelastine Hcl) .... One Qtts Ou Bid 6)  Diprolene Af 0.05 %  Crea (Aug Betamethasone Dipropionate) .... Apply To Skin in Afected Areas 7)  Caltrate 600+d Plus 600-400 Mg-Unit  Chew (Calcium Carbonate-Vit D-Min) .... Once Daily 8)  Vitamin E 100 Unit/gm  Crea (Vitamin E) .... Once Daily 9)  Ativan 0.5 Mg  Tabs (Lorazepam) .... One By Mouth Q 6 Hour As Needed 10)  Elestat 0.05 %  Soln (Epinastine Hcl) .... Use As Directed To Eyes 11)  Patanol 0.1 %  Soln (Olopatadine Hcl) .... One Qtts Ou Bid 12)  Nasonex 50 Mcg/act  Susp (Mometasone Furoate) .... One Spray Two Times A Day Q Nare 13)  Lexapro 10 Mg Tabs (Escitalopram Oxalate) .... 1/2 By Mouth Daily 14)  Asmanex 120 Metered Doses 220 Mcg/inh Aepb (Mometasone Furoate) .... One Puf Dialy 15)  Proventil Hfa 108 (90 Base) Mcg/act  Aers (Albuterol Sulfate) .... 2 Puff By Mouth Q4 16)  Chlorzoxazone 500 Mg Tabs (Chlorzoxazone) .... One By Mouth Bid 17)  Diclofenac Potassium 50 Mg Tabs (Diclofenac Potassium) ....  One By Mouth Two Times A Day 18)  Methylprednisolone 8 Mg Tabs (Methylprednisolone) .... One By Mouth Two Times A Day For 5 Days The One By Mouth Daily Fro 5 Days The 1/2 By Mouth Daily For 10 Days  Current Medications (verified): 1)  Prempro 0.625-5 Mg  Tabs (Conj Estrog-Medroxyprogest Ace) .... Once Daily 2)  Multivitamins   Tabs (Multiple Vitamin) .... Once Daily 3)  Benicar Hct 20-12.5 Mg Tabs (Olmesartan Medoxomil-Hctz) .... One By Mouth Dail 4)  Lovaza 1 Gm  Caps (Omega-3-Acid Ethyl Esters) .... Occassionally 5)  Optivar 0.05 %  Soln (Azelastine Hcl) .... One Qtts Ou Bid 6)  Diprolene Af 0.05 %  Crea (Aug Betamethasone Dipropionate) .... Apply To Skin in Afected Areas 7)  Caltrate 600+d Plus 600-400 Mg-Unit  Chew (Calcium Carbonate-Vit D-Min) .... Once Daily 8)  Vitamin E 100 Unit/gm  Crea (Vitamin E) .... Once Daily 9)  Ativan 0.5 Mg  Tabs (Lorazepam) .... One By Mouth Q 6 Hour As Needed 10)  Elestat 0.05 %  Soln (Epinastine Hcl) .... Use As Directed To Eyes 11)  Patanol 0.1 %  Soln (Olopatadine Hcl) .... One Qtts Ou Bid 12)  Nasonex 50 Mcg/act  Susp (Mometasone Furoate) .... One Spray Two Times A Day Q Nare 13)  Lexapro 10 Mg Tabs (Escitalopram Oxalate) .... 1/2 By Mouth Daily 14)  Asmanex 120 Metered Doses 220 Mcg/inh Aepb (Mometasone Furoate) .... One Puf Dialy 15)  Proventil Hfa 108 (90 Base) Mcg/act  Aers (Albuterol Sulfate) .... 2 Puff By Mouth Q4 16)  Chlorzoxazone 500 Mg Tabs (Chlorzoxazone) .... One By Mouth Bid 17)  Diclofenac Potassium 50 Mg Tabs (Diclofenac Potassium) .... One By Mouth Two Times A Day 18)  Methylprednisolone 8 Mg Tabs (Methylprednisolone) .... One By Mouth Two Times A Day For 5 Days The One By Mouth Daily Fro 5 Days The 1/2 By Mouth Daily For 10 Days  Allergies (verified): No Known Drug Allergies  Past History:  Family History: Last updated: 09/26/2006 Family History of Arthritis Family History High cholesterol  Social History: Last updated:  09/26/2006 Occupation: Married Never Smoked  Risk Factors: Smoking Status: never (07/05/2009)  Past medical, surgical, family and social histories (including risk factors) reviewed, and no changes noted (except as noted below).  Past Medical History: Reviewed history from 12/31/2006 and no changes required. Allergic rhinitis Depression Hypertension Hyperlipidemia  Past Surgical History: Reviewed history from 09/26/2006 and no changes required. Caesarean section x 2 Rotator cuff repair  Family History: Reviewed history from 09/26/2006 and no changes required. Family History of Arthritis Family History High cholesterol  Social History: Reviewed history from 09/26/2006 and no changes required. Occupation: Married Never Smoked  Review of Systems  The patient denies anorexia, fever, weight loss, weight gain, vision loss, decreased hearing, hoarseness, chest pain, syncope, dyspnea on exertion, peripheral edema, prolonged cough, headaches, hemoptysis, abdominal pain, melena, hematochezia, severe indigestion/heartburn, hematuria, incontinence, genital sores,  muscle weakness, suspicious skin lesions, transient blindness, difficulty walking, depression, unusual weight change, abnormal bleeding, enlarged lymph nodes, angioedema, and breast masses.    Physical Exam  General:  Well-developed,well-nourished,in no acute distress; alert,appropriate and cooperative throughout examination Head:  Normocephalic and atraumatic without obvious abnormalities. No apparent alopecia or balding. Eyes:  conjunctival injection and excessive tearing.   Ears:  R ear normal and L ear normal.   Nose:  nasal dischargemucosal pallor and airflow obstruction.   Mouth:  posterior lymphoid hypertrophy and postnasal drip.   Neck:  No deformities, masses, or tenderness noted. Lungs:  normal respiratory effort and no wheezes.   Heart:  normal rate and regular rhythm.     Impression &  Recommendations:  Problem # 1:  ASTHMA, UNSPECIFIED, UNSPECIFIED STATUS (ICD-493.90)  Her updated medication list for this problem includes:    Asmanex 120 Metered Doses 220 Mcg/inh Aepb (Mometasone furoate) ..... One puf dialy    Proventil Hfa 108 (90 Base) Mcg/act Aers (Albuterol sulfate) .Marland Kitchen... 2 puff by mouth q4    Methylprednisolone 8 Mg Tabs (Methylprednisolone) ..... One by mouth two times a day for 5 days the one by mouth daily fro 5 days the 1/2 by mouth daily for 10 days  Current Asthma Management Plan:  Green Zone:  ASMANEX 120 METERED DOSES 220 MCG/INH AEPB:  1 inhalation once a day Yellow Zone:  PROVENTIL HFA 108 (90 BASE) MCG/ACT  AERS:  2 puffs every 4 hours as needed  Problem # 2:  CONJUNCTIVITIS, ALLERGIC, CHRONIC (ICD-372.14) depo medrol shot Discussed treatment, and urged patient to wash hands carefully after touching face.   Problem # 3:  HYPERTENSION (ICD-401.9)  Her updated medication list for this problem includes:    Benicar Hct 20-12.5 Mg Tabs (Olmesartan medoxomil-hctz) ..... One by mouth dail  BP today: 122/80 Prior BP: 130/70 (06/06/2009)  Prior 10 Yr Risk Heart Disease: Not enough information (03/27/2007)  Labs Reviewed: K+: 3.5 (09/15/2008) Creat: : 0.6 (09/15/2008)   Chol: 222 (09/15/2008)   HDL: 62.40 (09/15/2008)   TG: 88.0 (09/15/2008)  Complete Medication List: 1)  Prempro 0.625-5 Mg Tabs (Conj estrog-medroxyprogest ace) .... Once daily 2)  Multivitamins Tabs (Multiple vitamin) .... Once daily 3)  Benicar Hct 20-12.5 Mg Tabs (Olmesartan medoxomil-hctz) .... One by mouth dail 4)  Lovaza 1 Gm Caps (Omega-3-acid ethyl esters) .... Occassionally 5)  Optivar 0.05 % Soln (Azelastine hcl) .... One qtts ou bid 6)  Diprolene Af 0.05 % Crea (Aug betamethasone dipropionate) .... Apply to skin in afected areas 7)  Caltrate 600+d Plus 600-400 Mg-unit Chew (Calcium carbonate-vit d-min) .... Once daily 8)  Vitamin E 100 Unit/gm Crea (Vitamin e) .... Once  daily 9)  Ativan 0.5 Mg Tabs (Lorazepam) .... One by mouth q 6 hour as needed 10)  Elestat 0.05 % Soln (Epinastine hcl) .... Use as directed to eyes 11)  Patanol 0.1 % Soln (Olopatadine hcl) .... One qtts ou bid 12)  Nasonex 50 Mcg/act Susp (Mometasone furoate) .... One spray two times a day q nare 13)  Lexapro 10 Mg Tabs (Escitalopram oxalate) .... 1/2 by mouth daily 14)  Asmanex 120 Metered Doses 220 Mcg/inh Aepb (Mometasone furoate) .... One puf dialy 15)  Proventil Hfa 108 (90 Base) Mcg/act Aers (Albuterol sulfate) .... 2 puff by mouth q4 16)  Chlorzoxazone 500 Mg Tabs (Chlorzoxazone) .... One by mouth bid 17)  Diclofenac Potassium 50 Mg Tabs (Diclofenac potassium) .... One by mouth two times a day 18)  Methylprednisolone 8 Mg  Tabs (Methylprednisolone) .... One by mouth two times a day for 5 days the one by mouth daily fro 5 days the 1/2 by mouth daily for 10 days  Patient Instructions: 1)  Please schedule a follow-up appointment in 2 months.

## 2010-03-30 NOTE — Progress Notes (Signed)
Summary: wants ov today  Phone Note Call from Patient Call back at Work Phone [phone]   Caller: Patient---live call Reason for Call: Talk to Nurse Summary of Call: Having severe headaches x 3 days. Left eye vision is blurry. Wants to see Dr Lovell Sheehan today. Initial call taken by: Warnell Forester,  May 27, 2009 8:13 AM  Follow-up for Phone Call        pt may have 3:30 appointment-please call Follow-up by: Willy Eddy, LPN,  May 27, 2009 8:47 AM  Additional Follow-up for Phone Call Additional follow up Details #1::        pt aware. Additional Follow-up by: Warnell Forester,  May 27, 2009 9:19 AM

## 2010-03-30 NOTE — Assessment & Plan Note (Signed)
Summary: 2 month rov/njr   Vital Signs:  Patient profile:   55 year old female Height:      66 inches Weight:      200 pounds BMI:     32.40 Temp:     98.2 degrees F oral Pulse rate:   68 / minute Resp:     14 per minute BP sitting:   110 / 76  (left arm)  Vitals Entered By: Willy Eddy, LPN (September 05, 2009 4:26 PM) CC: roa   CC:  roa.  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) 3)  Scotoma of Blind Spot Area in [employer]  [phone]) 4)  Asthma, Unspecified, Unspecified Status  (ICD-493.90) 5)  Health Maintenance Exam  (ICD-V70.0) 6)  Conjunctivitis, Allergic, Chronic  (ICD-372.14) 7)  Neck and Back Pain  (ICD-723.1) 8)  Concussion With No Loss of Consciousness  (ICD-850.0) 9)  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) 10)  Edema  (ICD-782.3) 11)  Back Pain, Left  (ICD-724.5) 12)  Uri  (ICD-465.9) 13)  Psoriasis, 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) 14)  Foot Pain, Left  (ICD-729.5) 15)  Hyperlipidemia  (ICD-272.4) 16)  Tendinitis  (ICD-726.90) 17)  Knee Pain, Acute  (ICD-719.46) 18)  Diarrhea, Presumed Infectious Origin  (ICD-009.3) 19)  Hypertension  (ICD-401.9) 20)  Depression  (ICD-311) 21)  Menopause  (ICD-627.2) 22)  Allergic Rhinitis  (ICD-477.9)  Current Problems (verified): 1)  Acute Bronchitis  (ICD-466.0) 2)  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) 3)  Scotoma of Blind Spot Area in [employer]  [phone] 4)  Asthma, Unspecified,  Unspecified Status  (ICD-493.90) 5)  Health Maintenance Exam  (ICD-V70.0) 6)  Conjunctivitis, Allergic, Chronic  (ICD-372.14) 7)  Neck and Back Pain  (ICD-723.1) 8)  Concussion With No Loss of Consciousness  (ICD-850.0) 9)  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 Once Daily 2)  Multivitamins   Tabs (Multiple Vitamin) .... Once Daily 3)  Benicar Hct 20-12.5 Mg Tabs (Olmesartan Medoxomil-Hctz) .... One By Mouth Dail 4)  Lovaza 1 Gm  Caps (Omega-3-Acid Ethyl Esters) .... Occassionally 5)  Optivar 0.05 %  Soln (Azelastine Hcl) .... One Qtts Ou Bid 6)  Diprolene Af 0.05 %  Crea (Aug Betamethasone Dipropionate) .... Apply To Skin in Afected Areas 7)  Caltrate 600+d Plus 600-400 Mg-Unit  Chew (Calcium Carbonate-Vit D-Min) .... Once Daily 8)  Vitamin E 100 Unit/gm  Crea (Vitamin E) .Marland KitchenMarland KitchenMarland Kitchen  Once Daily 9)  Ativan 0.5 Mg  Tabs (Lorazepam) .... One By Mouth Q 6 Hour As Needed 10)  Elestat 0.05 %  Soln (Epinastine Hcl) .... Use As Directed To Eyes 11)  Patanol 0.1 %  Soln (Olopatadine Hcl) .... One Qtts Ou Bid 12)  Nasonex 50 Mcg/act  Susp (Mometasone Furoate) .... One Spray Two Times A Day Q Nare 13)  Lexapro 10 Mg Tabs (Escitalopram Oxalate) .... 1/2 By Mouth Daily 14)  Asmanex 120 Metered Doses 220 Mcg/inh Aepb (Mometasone Furoate) .... One Puf Dialy 15)  Proventil Hfa 108 (90 Base) Mcg/act  Aers (Albuterol Sulfate) .... 2 Puff By Mouth Q4 16)  Chlorzoxazone  500 Mg Tabs (Chlorzoxazone) .... One By Mouth Bid 17)  Diclofenac Potassium 50 Mg Tabs (Diclofenac Potassium) .... One By Mouth Two Times A Day 18)  Methylprednisolone 8 Mg Tabs (Methylprednisolone) .... One By Mouth Two Times A Day For 5 Days The One By Mouth Daily Fro 5 Days The 1/2 By Mouth Daily For 10 Days 19)  Azithromycin 250 Mg Tabs (Azithromycin) .... 2 By Mouth Today Then One By Mouth Once Daily For 4 Days 20)  Hydrocodone-Homatropine 5-1.5 Mg/74ml Syrp (Hydrocodone-Homatropine) .... One Tsp By Mouth By Mouth 4-6 Hours As Needed For Cough.  Current Medications (verified): 1)  Multivitamins   Tabs (Multiple Vitamin) .... Once Daily 2)  Benicar Hct 20-12.5 Mg Tabs (Olmesartan Medoxomil-Hctz) .... One By Mouth Dail 3)  Optivar 0.05 %  Soln (Azelastine Hcl) .... One Qtts Ou Bid 4)  Diprolene Af 0.05 %  Crea (Aug Betamethasone Dipropionate) .... Apply To Skin in Afected Areas 5)  Caltrate 600+d Plus 600-400 Mg-Unit  Chew (Calcium Carbonate-Vit D-Min) .... Once Daily 6)  Vitamin E 100 Unit/gm  Crea (Vitamin E) .... Once Daily 7)  Elestat 0.05 %  Soln (Epinastine Hcl) .... Use As Directed To Eyes 8)  Patanol 0.1 %  Soln (Olopatadine Hcl) .... One Qtts Ou Bid 9)  Nasonex 50 Mcg/act  Susp (Mometasone Furoate) .... One Spray Two Times A Day Q Nare 10)  Lexapro 10 Mg Tabs (Escitalopram Oxalate) .... 1/2 By Mouth Daily 11)  Asmanex 120 Metered Doses 220 Mcg/inh Aepb (Mometasone Furoate) .... One Puf Dialy 12)  Proventil Hfa 108 (90 Base) Mcg/act  Aers (Albuterol Sulfate) .... 2 Puff By Mouth Q4 13)  Chlorzoxazone 500 Mg Tabs (Chlorzoxazone) .... One By Mouth Bid 14)  Diclofenac Potassium 50 Mg Tabs (Diclofenac Potassium) .... One By Mouth Two Times A Day 15)  Methylprednisolone 8 Mg Tabs (Methylprednisolone) .... One By Mouth Two Times A Day For 5 Days The One By Mouth Daily Fro 5 Days The 1/2 By Mouth Daily For 10 Days 16)  Azithromycin 250 Mg Tabs (Azithromycin) .... 2 By Mouth Today Then One By  Mouth Once Daily For 4 Days 17)  Hydrocodone-Homatropine 5-1.5 Mg/50ml Syrp (Hydrocodone-Homatropine) .... One Tsp By Mouth By Mouth 4-6 Hours As Needed For Cough. 18)  Phentermine Hcl 37.5 Mg Tabs (Phentermine Hcl) .... One By Mouth Daily  Allergies (verified): No Known Drug Allergies  Past History:  Family History: Last updated: 09/26/2006 Family History of Arthritis Family History High cholesterol  Social History: Last updated: 09/26/2006 Occupation: Married Never Smoked  Risk Factors: Smoking Status: never (09/05/2009)  Past medical, surgical, family and social histories (including risk factors) reviewed, and no changes noted (except as noted below).  Past Medical History: Reviewed history from 12/31/2006 and no changes required. Allergic rhinitis Depression Hypertension Hyperlipidemia  Past Surgical History:  Reviewed history from 09/26/2006 and no changes required. Caesarean section x 2 Rotator cuff repair  Family History: Reviewed history from 09/26/2006 and no changes required. Family History of Arthritis Family History High cholesterol  Social History: Reviewed history from 09/26/2006 and no changes required. Occupation: Married Never Smoked  Review of Systems       The patient complains of chest pain.  The patient denies anorexia, fever, weight loss, weight gain, vision loss, decreased hearing, hoarseness, syncope, dyspnea on exertion, peripheral edema, prolonged cough, headaches, hemoptysis, abdominal pain, melena, hematochezia, severe indigestion/heartburn, hematuria, incontinence, genital sores, muscle weakness, suspicious skin lesions, transient blindness, difficulty walking, depression, unusual weight change, abnormal bleeding, enlarged lymph nodes, angioedema, and breast masses.    Physical Exam  General:  Well-developed,well-nourished,in no acute distress; alert,appropriate and cooperative throughout examination Head:  Normocephalic and atraumatic  without obvious abnormalities. No apparent alopecia or balding. Eyes:  pupils equal and pupils round.   Ears:  R ear normal and L ear normal.   Nose:  no external deformity and no nasal discharge.   Neck:  No deformities, masses, or tenderness noted. Lungs:  Normal respiratory effort, chest expands symmetrically. Lungs are clear to auscultation, no crackles or wheezes. Heart:  Normal rate and regular rhythm. S1 and S2 normal without gallop, murmur, click, rub or other extra sounds. Abdomen:  Bowel sounds positive,abdomen soft and non-tender without masses, organomegaly or hernias noted.   Impression & Recommendations:  Problem # 1:  ASTHMA, UNSPECIFIED, UNSPECIFIED STATUS (ICD-493.90) Assessment Unchanged stable  Her updated medication list for this problem includes:    Asmanex 120 Metered Doses 220 Mcg/inh Aepb (Mometasone furoate) ..... One puf dialy    Proventil Hfa 108 (90 Base) Mcg/act Aers (Albuterol sulfate) .Marland Kitchen... 2 puff by mouth q4    Methylprednisolone 8 Mg Tabs (Methylprednisolone) ..... One by mouth two times a day for 5 days the one by mouth daily fro 5 days the 1/2 by mouth daily for 10 days  Pulmonary Functions Reviewed: O2 sat: 95 (08/08/2009)  Current Asthma Management Plan:  Green Zone:  ASMANEX 120 METERED DOSES 220 MCG/INH AEPB:  1 inhalation once a day Yellow Zone:  PROVENTIL HFA 108 (90 BASE) MCG/ACT  AERS:  2 puffs every 4 hours as needed  Problem # 2:  HYPERTENSION (ICD-401.9) Assessment: Improved  Her updated medication list for this problem includes:    Benicar Hct 20-12.5 Mg Tabs (Olmesartan medoxomil-hctz) ..... One by mouth dail  BP today: 110/76 Prior BP: 92/70 (08/08/2009)  Prior 10 Yr Risk Heart Disease: Not enough information (03/27/2007)  Labs Reviewed: K+: 3.5 (09/15/2008) Creat: : 0.6 (09/15/2008)   Chol: 222 (09/15/2008)   HDL: 62.40 (09/15/2008)   TG: 88.0 (09/15/2008)  Problem # 3:  HYPERTENSION (ICD-401.9)  Her updated medication  list for this problem includes:    Benicar Hct 20-12.5 Mg Tabs (Olmesartan medoxomil-hctz) ..... One by mouth dail  BP today: 110/76 Prior BP: 92/70 (08/08/2009)  Prior 10 Yr Risk Heart Disease: Not enough information (03/27/2007)  Labs Reviewed: K+: 3.5 (09/15/2008) Creat: : 0.6 (09/15/2008)   Chol: 222 (09/15/2008)   HDL: 62.40 (09/15/2008)   TG: 88.0 (09/15/2008)  Problem # 4:  CONJUNCTIVITIS, ALLERGIC, CHRONIC (ICD-372.14) one eye drops  Complete Medication List: 1)  Multivitamins Tabs (Multiple vitamin) .... Once daily 2)  Benicar Hct 20-12.5 Mg Tabs (Olmesartan medoxomil-hctz) .... One by mouth dail 3)  Optivar 0.05 % Soln (Azelastine hcl) .... One qtts ou bid 4)  Diprolene Af 0.05 % Crea (  Aug betamethasone dipropionate) .... Apply to skin in afected areas 5)  Caltrate 600+d Plus 600-400 Mg-unit Chew (Calcium carbonate-vit d-min) .... Once daily 6)  Vitamin E 100 Unit/gm Crea (Vitamin e) .... Once daily 7)  Elestat 0.05 % Soln (Epinastine hcl) .... Use as directed to eyes 8)  Patanol 0.1 % Soln (Olopatadine hcl) .... One qtts ou bid 9)  Nasonex 50 Mcg/act Susp (Mometasone furoate) .... One spray two times a day q nare 10)  Lexapro 10 Mg Tabs (Escitalopram oxalate) .... 1/2 by mouth daily 11)  Asmanex 120 Metered Doses 220 Mcg/inh Aepb (Mometasone furoate) .... One puf dialy 12)  Proventil Hfa 108 (90 Base) Mcg/act Aers (Albuterol sulfate) .... 2 puff by mouth q4 13)  Chlorzoxazone 500 Mg Tabs (Chlorzoxazone) .... One by mouth bid 14)  Diclofenac Potassium 50 Mg Tabs (Diclofenac potassium) .... One by mouth two times a day 15)  Methylprednisolone 8 Mg Tabs (Methylprednisolone) .... One by mouth two times a day for 5 days the one by mouth daily fro 5 days the 1/2 by mouth daily for 10 days 16)  Azithromycin 250 Mg Tabs (Azithromycin) .... 2 by mouth today then one by mouth once daily for 4 days 17)  Hydrocodone-homatropine 5-1.5 Mg/31ml Syrp (Hydrocodone-homatropine) .... One tsp  by mouth by mouth 4-6 hours as needed for cough. 18)  Phentermine Hcl 37.5 Mg Tabs (Phentermine hcl) .... One by mouth daily  Patient Instructions: 1)  clarks or nike cole haan  shoe to help the foot 2)  Please schedule a follow-up appointment in 3 months. Prescriptions: PHENTERMINE HCL 37.5 MG TABS (PHENTERMINE HCL) one by mouth daily  #30 x 11   Entered and Authorized by:   Stacie Glaze MD   Signed by:   Stacie Glaze MD on 09/05/2009   Method used:   Print then Give to Patient   RxID:   [id number]

## 2010-03-30 NOTE — Assessment & Plan Note (Signed)
Summary: persistent cough/shortness of breath/cjr   Vital Signs:  Patient profile:   55 year old female Weight:      205 pounds Temp:     98.0 degrees F oral BP sitting:   110 / 78  (left arm) Cuff size:   large  Vitals Entered By: Sid Falcon LPN (December 19, 2009 4:02 PM)  History of Present Illness: Patient seen with recurrent cough. Seen with similar type cough in August and treated with Zithromax and cough suppressant and eventually improved. Recurrent cough over possibly 6 weeks. Cough is dry. No associated fever. Some mild sinus congestion. Proventil helps.  She does have history of asthma and is not taking Asmanex inhaler. She is aware of occasional wheezing. No GERD symptoms. No allergic postnasal drip symptoms. Occasionally has dyspnea with activity. Nonsmoker  Allergies (verified): No Known Drug Allergies  Past History:  Past Medical History: Last updated: 12/31/2006 Allergic rhinitis Depression Hypertension Hyperlipidemia PMH reviewed for relevance  Review of Systems  The patient denies anorexia, fever, weight loss, hoarseness, dyspnea on exertion, peripheral edema, and hemoptysis.    Physical Exam  General:  Well-developed,well-nourished,in no acute distress; alert,appropriate and cooperative throughout examination Ears:  External ear exam shows no significant lesions or deformities.  Otoscopic examination reveals clear canals, tympanic membranes are intact bilaterally without bulging, retraction, inflammation or discharge. Hearing is grossly normal bilaterally. Nose:  External nasal examination shows no deformity or inflammation. Nasal mucosa are pink and moist without lesions or exudates. Mouth:  Oral mucosa and oropharynx without lesions or exudates.  Teeth in good repair. Neck:  No deformities, masses, or tenderness noted. Lungs:  Normal respiratory effort, chest expands symmetrically. Lungs are clear to auscultation, no crackles or wheezes. Heart:   Normal rate and regular rhythm. S1 and S2 normal without gallop, murmur, click, rub or other extra sounds.   Impression & Recommendations:  Problem # 1:  ASTHMA, UNSPECIFIED, UNSPECIFIED STATUS (ICD-493.90) start back steroid inhaler and refill Proventil.  Samples Qvar 80 given one puff two times a day  Consider CXR if no better in 2 weeks. Her updated medication list for this problem includes:    Qvar 80 Mcg/act Aers (Beclomethasone dipropionate) ..... One puff two times a day    Proventil Hfa 108 (90 Base) Mcg/act Aers (Albuterol sulfate) .Marland Kitchen... 2 puff by mouth q4    Methylprednisolone 8 Mg Tabs (Methylprednisolone) ..... One by mouth two times a day for 5 days the one by mouth daily fro 5 days the 1/2 by mouth daily for 10 days  Complete Medication List: 1)  Multivitamins Tabs (Multiple vitamin) .... Once daily 2)  Benicar Hct 20-12.5 Mg Tabs (Olmesartan medoxomil-hctz) .... One by mouth dail 3)  Optivar 0.05 % Soln (Azelastine hcl) .... One qtts ou bid 4)  Diprolene Af 0.05 % Crea (Aug betamethasone dipropionate) .... Apply to skin in afected areas 5)  Caltrate 600+d Plus 600-400 Mg-unit Chew (Calcium carbonate-vit d-min) .... Once daily 6)  Vitamin E 100 Unit/gm Crea (Vitamin e) .... Once daily 7)  Elestat 0.05 % Soln (Epinastine hcl) .... Use as directed to eyes 8)  Patanol 0.1 % Soln (Olopatadine hcl) .... One qtts ou bid 9)  Nasonex 50 Mcg/act Susp (Mometasone furoate) .... One spray two times a day q nare 10)  Lexapro 10 Mg Tabs (Escitalopram oxalate) .... 1/2 by mouth daily 11)  Qvar 80 Mcg/act Aers (Beclomethasone dipropionate) .... One puff two times a day 12)  Proventil Hfa 108 (90 Base) Mcg/act Aers (  Albuterol sulfate) .... 2 puff by mouth q4 13)  Chlorzoxazone 500 Mg Tabs (Chlorzoxazone) .... One by mouth bid 14)  Diclofenac Potassium 50 Mg Tabs (Diclofenac potassium) .... One by mouth two times a day 15)  Methylprednisolone 8 Mg Tabs (Methylprednisolone) .... One by mouth  two times a day for 5 days the one by mouth daily fro 5 days the 1/2 by mouth daily for 10 days 16)  Hydrocodone-homatropine 5-1.5 Mg/22ml Syrp (Hydrocodone-homatropine) .... One tsp by mouth by mouth 4-6 hours as needed for cough. 17)  Phentermine Hcl 37.5 Mg Tabs (Phentermine hcl) .... One by mouth daily  Patient Instructions: 1)  Start Qvar 80 one puff twice daily and rinse mouth after use 2)  Data base with Dr. Lovell Sheehan in 2 weeks if cough not improving. Consider chest x-ray at that time if not improved Prescriptions: PROVENTIL HFA 108 (90 BASE) MCG/ACT  AERS (ALBUTEROL SULFATE) 2 puff by mouth q4  #1 x 1   Entered and Authorized by:   Evelena Peat MD   Signed by:   Evelena Peat MD on 12/19/2009   Method used:   Print then Give to Patient   RxID:   [id number]    Orders Added: 1)  Est. Patient Level III [14782]  Appended Document: Orders Update    Clinical Lists Changes  Problems: Added new problem of COUGH, CHRONIC (ICD-786.2) Orders: Added new Test order of T-2 View CXR (71020TC) - Signed

## 2010-03-30 NOTE — Progress Notes (Signed)
Summary: injection  Phone Note Call from Patient   Caller: Patient Call For: Stacie Glaze MD Reason for Call: Acute Illness Summary of Call: Pt is having seasonal allergy symptoms......Marland Kitchenrunny nose, itchy eyes, and sneezing.  States she usually gets an injection without an office visit? [phone] Initial call taken by: Lynann Beaver CMA,  Jul 04, 2009 9:54 AM  Follow-up for Phone Call        left message on machine for pt -can be seen tomorrow at 11;15 Follow-up by: Willy Eddy, LPN,  Jul 04, 4401 1:58 PM

## 2010-03-30 NOTE — Assessment & Plan Note (Signed)
Summary: ?sinus inf/njr   Vital Signs:  Patient profile:   55 year old female Weight:      194 pounds Pulse rate:   70 / minute BP sitting:   118 / 74  (right arm)  Vitals Entered By: Kyung Rudd, CMA (March 03, 2010 1:40 PM) CC: pt c/o cough keeping her up at night, feels like someone is blowing in her ears, coughs up green and yellow mucus...been going on for 4 months    Primary Care Provider:  Stacie Glaze MD  CC:  pt c/o cough keeping her up at night, feels like someone is blowing in her ears, and coughs up green and yellow mucus...been going on for 4 months .  History of Present Illness: Patient presents to clinic as a workin for evaluation of cough. States has cough dating back several months initially hacking now productive for yellow/green sputum. Attempted qvar with minimal transient improvement. Reviewed cxr without acute process. +post nasal gtt, ST, fonrtal ha with maxillary pressure and subjective fever.   Current Medications (verified): 1)  Multivitamins   Tabs (Multiple Vitamin) .... Once Daily 2)  Benicar Hct 20-12.5 Mg Tabs (Olmesartan Medoxomil-Hctz) .... One By Mouth Dail 3)  Optivar 0.05 %  Soln (Azelastine Hcl) .... One Qtts Ou Bid 4)  Diprolene Af 0.05 %  Crea (Aug Betamethasone Dipropionate) .... Apply To Skin in Afected Areas 5)  Caltrate 600+d Plus 600-400 Mg-Unit  Chew (Calcium Carbonate-Vit D-Min) .... Once Daily 6)  Vitamin E 100 Unit/gm  Crea (Vitamin E) .... Once Daily 7)  Elestat 0.05 %  Soln (Epinastine Hcl) .... Use As Directed To Eyes 8)  Patanol 0.1 %  Soln (Olopatadine Hcl) .... One Qtts Ou Bid 9)  Nasonex 50 Mcg/act  Susp (Mometasone Furoate) .... One Spray Two Times A Day Q Nare 10)  Lexapro 10 Mg Tabs (Escitalopram Oxalate) .... 1/2 By Mouth Daily 11)  Qvar 80 Mcg/act Aers (Beclomethasone Dipropionate) .... One Puff Two Times A Day 12)  Proventil Hfa 108 (90 Base) Mcg/act  Aers (Albuterol Sulfate) .... 2 Puff By Mouth Q4 13)   Chlorzoxazone 500 Mg Tabs (Chlorzoxazone) .... One By Mouth Bid 14)  Phentermine Hcl 37.5 Mg Tabs (Phentermine Hcl) .... One By Mouth Daily  Allergies (verified): No Known Drug Allergies  Past History:  Past medical, surgical, family and social histories (including risk factors) reviewed for relevance to current acute and chronic problems.  Past Medical History: Reviewed history from 12/31/2006 and no changes required. Allergic rhinitis Depression Hypertension Hyperlipidemia  Past Surgical History: Reviewed history from 09/26/2006 and no changes required. Caesarean section x 2 Rotator cuff repair  Family History: Reviewed history from 09/26/2006 and no changes required. Family History of Arthritis Family History High cholesterol  Social History: Reviewed history from 09/26/2006 and no changes required. Occupation: Married Never Smoked  Review of Systems      See HPI  Physical Exam  General:  Well-developed,well-nourished,in no acute distress; alert,appropriate and cooperative throughout examination Head:  Normocephalic and atraumatic without obvious abnormalities. No apparent alopecia or balding. Eyes:  pupils equal, pupils round, pupils react to accomodation, and corneas and lenses clear.   Ears:  External ear exam shows no significant lesions or deformities.  Otoscopic examination reveals clear canals, tympanic membranes are intact bilaterally without bulging, retraction, inflammation or discharge. Hearing is grossly normal bilaterally. Nose:  External nasal examination shows no deformity or inflammation. Nasal mucosa are pink and moist without lesions or exudates. Mouth:  Oral  mucosa and oropharynx without lesions or exudates.  Teeth in good repair. Neck:  No deformities, masses, or tenderness noted. Lungs:  Normal respiratory effort, chest expands symmetrically. Lungs are clear to auscultation, no crackles or wheezes. Heart:  Normal rate and regular rhythm. S1 and S2  normal without gallop, murmur, click, rub or other extra sounds.   Impression & Recommendations:  Problem # 1:  COUGH (ICD-786.2) Assessment Deteriorated Progressive now with symptoms suggestive of sinus etiology. Attempt 14d course of levaquin Consider ENT consult if symptoms unresponsive. Followup if no improvement or worsening.  Complete Medication List: 1)  Multivitamins Tabs (Multiple vitamin) .... Once daily 2)  Benicar Hct 20-12.5 Mg Tabs (Olmesartan medoxomil-hctz) .... One by mouth dail 3)  Optivar 0.05 % Soln (Azelastine hcl) .... One qtts ou bid 4)  Diprolene Af 0.05 % Crea (Aug betamethasone dipropionate) .... Apply to skin in afected areas 5)  Caltrate 600+d Plus 600-400 Mg-unit Chew (Calcium carbonate-vit d-min) .... Once daily 6)  Vitamin E 100 Unit/gm Crea (Vitamin e) .... Once daily 7)  Elestat 0.05 % Soln (Epinastine hcl) .... Use as directed to eyes 8)  Patanol 0.1 % Soln (Olopatadine hcl) .... One qtts ou bid 9)  Nasonex 50 Mcg/act Susp (Mometasone furoate) .... One spray two times a day q nare 10)  Lexapro 10 Mg Tabs (Escitalopram oxalate) .... 1/2 by mouth daily 11)  Qvar 80 Mcg/act Aers (Beclomethasone dipropionate) .... One puff two times a day 12)  Proventil Hfa 108 (90 Base) Mcg/act Aers (Albuterol sulfate) .... 2 puff by mouth q4 13)  Chlorzoxazone 500 Mg Tabs (Chlorzoxazone) .... One by mouth bid 14)  Phentermine Hcl 37.5 Mg Tabs (Phentermine hcl) .... One by mouth daily 15)  Levaquin 500 Mg Tabs (Levofloxacin) .... One by mouth qd Prescriptions: LEVAQUIN 500 MG TABS (LEVOFLOXACIN) one by mouth qd  #14 x 0   Entered and Authorized by:   Edwyna Perfect MD   Signed by:   Edwyna Perfect MD on 03/03/2010   Method used:   Electronically to        [id number] (retail)       [address]       [place], [place]  [address]       Ph: [phone] or [phone]       Fax: [phone]   RxID:   [id number]    Orders Added: 1)  Est. Patient Level III  [27253]

## 2010-03-30 NOTE — Assessment & Plan Note (Signed)
Summary: CONGESTION // RS   Vital Signs:  Patient profile:   55 year old female Weight:      191 pounds Temp:     09.1 degrees F oral BP sitting:   100 / 60  (left arm) Cuff size:   large  Vitals Entered By: Kathrynn Speed CMA (October 12, 2009 4:24 PM) CC: chest congestion, x 5 days, is allergic to dogs with daughter that has dog, wheezing, coughing,muscus in throat, mucus is yellow,src   History of Present Illness: Patient seen with onset this past Saturday of some shortness of breath, increased cough and wheezing. Cough occasionally productive of yellow sputum. Has history of asthma. Using Proventil without much improvement. Symptoms preceded by her helping daughter move and exposed to a lot of dust and daughter's dog. She has had reactions to pets before. No fever. History allergic rhinitis and takes antihistamines and Nasonex regularly. Previously on Asmanex but not taking currently.  no clear history of persistent asthma.  Current Medications (verified): 1)  Multivitamins   Tabs (Multiple Vitamin) .... Once Daily 2)  Benicar Hct 20-12.5 Mg Tabs (Olmesartan Medoxomil-Hctz) .... One By Mouth Dail 3)  Optivar 0.05 %  Soln (Azelastine Hcl) .... One Qtts Ou Bid 4)  Diprolene Af 0.05 %  Crea (Aug Betamethasone Dipropionate) .... Apply To Skin in Afected Areas 5)  Caltrate 600+d Plus 600-400 Mg-Unit  Chew (Calcium Carbonate-Vit D-Min) .... Once Daily 6)  Vitamin E 100 Unit/gm  Crea (Vitamin E) .... Once Daily 7)  Elestat 0.05 %  Soln (Epinastine Hcl) .... Use As Directed To Eyes 8)  Patanol 0.1 %  Soln (Olopatadine Hcl) .... One Qtts Ou Bid 9)  Nasonex 50 Mcg/act  Susp (Mometasone Furoate) .... One Spray Two Times A Day Q Nare 10)  Lexapro 10 Mg Tabs (Escitalopram Oxalate) .... 1/2 By Mouth Daily 11)  Asmanex 120 Metered Doses 220 Mcg/inh Aepb (Mometasone Furoate) .... One Puf Dialy 12)  Proventil Hfa 108 (90 Base) Mcg/act  Aers (Albuterol Sulfate) .... 2 Puff By Mouth Q4 13)   Chlorzoxazone 500 Mg Tabs (Chlorzoxazone) .... One By Mouth Bid 14)  Diclofenac Potassium 50 Mg Tabs (Diclofenac Potassium) .... One By Mouth Two Times A Day 15)  Methylprednisolone 8 Mg Tabs (Methylprednisolone) .... One By Mouth Two Times A Day For 5 Days The One By Mouth Daily Fro 5 Days The 1/2 By Mouth Daily For 10 Days 16)  Azithromycin 250 Mg Tabs (Azithromycin) .... 2 By Mouth Today Then One By Mouth Once Daily For 4 Days 17)  Hydrocodone-Homatropine 5-1.5 Mg/29ml Syrp (Hydrocodone-Homatropine) .... One Tsp By Mouth By Mouth 4-6 Hours As Needed For Cough. 18)  Phentermine Hcl 37.5 Mg Tabs (Phentermine Hcl) .... One By Mouth Daily  Allergies (verified): No Known Drug Allergies  Past History:  Past Medical History: Last updated: 12/31/2006 Allergic rhinitis Depression Hypertension Hyperlipidemia  Review of Systems      See HPI  Physical Exam  General:  Well-developed,well-nourished,in no acute distress; alert,appropriate and cooperative throughout examination Head:  Normocephalic and atraumatic without obvious abnormalities. No apparent alopecia or balding. Ears:  External ear exam shows no significant lesions or deformities.  Otoscopic examination reveals clear canals, tympanic membranes are intact bilaterally without bulging, retraction, inflammation or discharge. Hearing is grossly normal bilaterally. Nose:  External nasal examination shows no deformity or inflammation. Nasal mucosa are pink and moist without lesions or exudates. Mouth:  Oral mucosa and oropharynx without lesions or exudates.  Teeth in good repair. Neck:  No deformities, masses, or tenderness noted. Lungs:  few very faint wheezes but no rales noted. Symmetric breath sounds. Heart:  Normal rate and regular rhythm. S1 and S2 normal without gallop, murmur, click, rub or other extra sounds.   Impression & Recommendations:  Problem # 1:  ASTHMA, UNSPECIFIED, UNSPECIFIED STATUS (ICD-493.90) Assessment  Deteriorated asthma probably exacerbated by recent exposure to dust. Depo-Medrol 80 mg given. Continue Proventil as needed. Consider getting back on steroid inhaler if symptoms persist Her updated medication list for this problem includes:    Asmanex 120 Metered Doses 220 Mcg/inh Aepb (Mometasone furoate) ..... One puf dialy    Proventil Hfa 108 (90 Base) Mcg/act Aers (Albuterol sulfate) .Marland Kitchen... 2 puff by mouth q4    Methylprednisolone 8 Mg Tabs (Methylprednisolone) ..... One by mouth two times a day for 5 days the one by mouth daily fro 5 days the 1/2 by mouth daily for 10 days  Complete Medication List: 1)  Multivitamins Tabs (Multiple vitamin) .... Once daily 2)  Benicar Hct 20-12.5 Mg Tabs (Olmesartan medoxomil-hctz) .... One by mouth dail 3)  Optivar 0.05 % Soln (Azelastine hcl) .... One qtts ou bid 4)  Diprolene Af 0.05 % Crea (Aug betamethasone dipropionate) .... Apply to skin in afected areas 5)  Caltrate 600+d Plus 600-400 Mg-unit Chew (Calcium carbonate-vit d-min) .... Once daily 6)  Vitamin E 100 Unit/gm Crea (Vitamin e) .... Once daily 7)  Elestat 0.05 % Soln (Epinastine hcl) .... Use as directed to eyes 8)  Patanol 0.1 % Soln (Olopatadine hcl) .... One qtts ou bid 9)  Nasonex 50 Mcg/act Susp (Mometasone furoate) .... One spray two times a day q nare 10)  Lexapro 10 Mg Tabs (Escitalopram oxalate) .... 1/2 by mouth daily 11)  Asmanex 120 Metered Doses 220 Mcg/inh Aepb (Mometasone furoate) .... One puf dialy 12)  Proventil Hfa 108 (90 Base) Mcg/act Aers (Albuterol sulfate) .... 2 puff by mouth q4 13)  Chlorzoxazone 500 Mg Tabs (Chlorzoxazone) .... One by mouth bid 14)  Diclofenac Potassium 50 Mg Tabs (Diclofenac potassium) .... One by mouth two times a day 15)  Methylprednisolone 8 Mg Tabs (Methylprednisolone) .... One by mouth two times a day for 5 days the one by mouth daily fro 5 days the 1/2 by mouth daily for 10 days 16)  Hydrocodone-homatropine 5-1.5 Mg/76ml Syrp  (Hydrocodone-homatropine) .... One tsp by mouth by mouth 4-6 hours as needed for cough. 17)  Phentermine Hcl 37.5 Mg Tabs (Phentermine hcl) .... One by mouth daily  Other Orders: Depo- Medrol 80mg  (J1040) Admin of Therapeutic Inj  intramuscular or subcutaneous (16109)  Patient Instructions: 1)  Call or follow up if you develop any fever or worsening lung symptoms. Prescriptions: HYDROCODONE-HOMATROPINE 5-1.5 MG/5ML SYRP (HYDROCODONE-HOMATROPINE) one tsp by mouth by mouth 4-6 hours as needed for cough.  #120 ml x 0   Entered and Authorized by:   Evelena Peat MD   Signed by:   Evelena Peat MD on 10/12/2009   Method used:   Print then Give to Patient   RxID:   [id number]    Medication Administration  Injection # 1:    Medication: Depo- Medrol 80mg     Diagnosis: ACUTE BRONCHITIS (ICD-466.0)    Route: IM    Site: RUOQ gluteus    Exp Date: 05/27/2012    Lot #: [id number]    Mfr: [employer]    Patient tolerated injection without complications    Given by: Sid Falcon LPN (October 12, 2009 4:58 PM)  Orders Added:  1)  Est. Patient Level III [62952] 2)  Depo- Medrol 80mg  [J1040] 3)  Admin of Therapeutic Inj  intramuscular or subcutaneous [84132]

## 2010-03-30 NOTE — Assessment & Plan Note (Signed)
Summary: 3 MONTH ROV/NJR   Vital Signs:  Patient profile:   55 year old female Height:      86 inches Weight:      196 pounds BMI:     18.70 Temp:     98.2 degrees F oral Pulse rate:   72 / minute Resp:     14 per minute BP sitting:   130 / 70  (left arm)  Vitals Entered By: Willy Eddy, LPN (June 06, 2009 4:18 PM) CC: f/u from opthamologist   CC:  f/u from opthamologist.  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 she has a dye study fluoroscein on may 17 and will need to repeat the sed rate today to make sure that this is not a presentation of an arteritis the initial sed rate was 53  presumptively will place on medrol until the sed rate is back   Preventive Screening-Counseling & Management  Alcohol-Tobacco     Smoking Status: never  Problems Prior to Update: 1)  Scotoma of Blind Spot Area in [employer]  [phone]) 2)  Asthma, Unspecified, Unspecified Status  (ICD-493.90) 3)  Health Maintenance Exam  (ICD-V70.0) 4)  Conjunctivitis, Allergic, Chronic  (ICD-372.14) 5)  Neck and Back Pain  (ICD-723.1) 6)  Concussion With No Loss of Consciousness  (ICD-850.0) 7)  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) 8)  Edema  (ICD-782.3) 9)  Back Pain, Left  (ICD-724.5) 10)  Uri  (ICD-465.9) 11)  Psoriasis, 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) 12)  Foot Pain, Left  (ICD-729.5) 13)  Hyperlipidemia  (ICD-272.4) 14)  Tendinitis  (ICD-726.90) 15)  Knee Pain, Acute  (ICD-719.46) 16)  Diarrhea, Presumed Infectious Origin  (ICD-009.3) 17)  Hypertension  (ICD-401.9) 18)  Depression  (ICD-311) 19)  Menopause  (ICD-627.2) 20)  Allergic Rhinitis  (ICD-477.9)  Current Problems (verified): 1)  Scotoma of Blind Spot Area in [employer]  [phone]) 2)  Asthma, Unspecified,  Unspecified Status  (ICD-493.90) 3)  Health Maintenance Exam  (ICD-V70.0) 4)  Conjunctivitis, Allergic, Chronic  (ICD-372.14) 5)  Neck and Back Pain  (ICD-723.1) 6)  Concussion With No Loss of Consciousness  (ICD-850.0) 7)  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 Once Daily 2)  Multivitamins   Tabs (Multiple Vitamin) .... Once Daily 3)  Benicar Hct 20-12.5 Mg Tabs (Olmesartan Medoxomil-Hctz) .... One By Mouth Dail 4)  Lovaza 1 Gm  Caps (Omega-3-Acid Ethyl Esters) .... Occassionally 5)  Optivar 0.05 %  Soln (Azelastine Hcl) .... One Qtts Ou Bid 6)  Diprolene Af 0.05 %  Crea (Aug Betamethasone Dipropionate) .... Apply To Skin in Afected Areas 7)  Caltrate 600+d Plus 600-400 Mg-Unit  Chew (Calcium Carbonate-Vit D-Min) .... Once Daily 8)  Vitamin E 100 Unit/gm  Crea (Vitamin E) .Marland KitchenMarland KitchenMarland Kitchen  Once Daily 9)  Ativan 0.5 Mg  Tabs (Lorazepam) .... One By Mouth Q 6 Hour As Needed 10)  Elestat 0.05 %  Soln (Epinastine Hcl) .... Use As Directed To Eyes 11)  Patanol 0.1 %  Soln (Olopatadine Hcl) .... One Qtts Ou Bid 12)  Nasonex 50 Mcg/act  Susp (Mometasone Furoate) .... One Spray Two Times A Day Q Nare 13)  Lexapro 10 Mg Tabs (Escitalopram Oxalate) .... 1/2 By Mouth Daily 14)  Asmanex 120 Metered Doses 220 Mcg/inh Aepb (Mometasone Furoate) .... One Puf Dialy 15)  Proventil Hfa 108 (90 Base) Mcg/act  Aers (Albuterol Sulfate) .... 2 Puff By Mouth Q4 16)  Chlorzoxazone 500  Mg Tabs (Chlorzoxazone) .... One By Mouth Bid 17)  Diclofenac Potassium 50 Mg Tabs (Diclofenac Potassium) .... One By Mouth Two Times A Day  Current Medications (verified): 1)  Prempro 0.625-5 Mg  Tabs (Conj Estrog-Medroxyprogest Ace) .... Once Daily 2)  Multivitamins   Tabs (Multiple Vitamin) .... Once Daily 3)  Benicar Hct 20-12.5 Mg Tabs (Olmesartan Medoxomil-Hctz) .... One By Mouth Dail 4)  Lovaza 1 Gm  Caps (Omega-3-Acid Ethyl Esters) .... Occassionally 5)  Optivar 0.05 %  Soln (Azelastine Hcl) .... One Qtts Ou Bid 6)  Diprolene Af 0.05 %  Crea (Aug Betamethasone Dipropionate) .... Apply To Skin in Afected Areas 7)  Caltrate 600+d Plus 600-400 Mg-Unit  Chew (Calcium Carbonate-Vit D-Min) .... Once Daily 8)  Vitamin E 100 Unit/gm  Crea (Vitamin E) .... Once Daily 9)  Ativan 0.5 Mg  Tabs (Lorazepam) .... One By Mouth Q 6 Hour As Needed 10)  Elestat 0.05 %  Soln (Epinastine Hcl) .... Use As Directed To Eyes 11)  Patanol 0.1 %  Soln (Olopatadine Hcl) .... One Qtts Ou Bid 12)  Nasonex 50 Mcg/act  Susp (Mometasone Furoate) .... One Spray Two Times A Day Q Nare 13)  Lexapro 10 Mg Tabs (Escitalopram Oxalate) .... 1/2 By Mouth Daily 14)  Asmanex 120 Metered Doses 220 Mcg/inh Aepb (Mometasone Furoate) .... One Puf Dialy 15)  Proventil Hfa 108 (90 Base) Mcg/act  Aers (Albuterol Sulfate) .... 2 Puff By Mouth Q4 16)  Chlorzoxazone 500 Mg Tabs (Chlorzoxazone) .... One By Mouth Bid 17)  Diclofenac Potassium 50 Mg Tabs (Diclofenac Potassium) .... One By Mouth Two Times A Day 18)  Methylprednisolone 8 Mg Tabs (Methylprednisolone) .... One By Mouth Two Times A Day For 5 Days The One By Mouth Daily Fro 5 Days The 1/2 By Mouth Daily For 10 Days  Allergies (verified): No Known Drug Allergies  Past History:  Family History: Last updated: 09/26/2006 Family History of Arthritis Family History High cholesterol  Social History: Last updated: 09/26/2006 Occupation: Married Never Smoked  Risk  Factors: Smoking Status: never (06/06/2009)  Past medical, surgical, family and social histories (including risk factors) reviewed, and no changes noted (except as noted below).  Past Medical History: Reviewed history from 12/31/2006 and no changes required. Allergic rhinitis Depression Hypertension Hyperlipidemia  Past Surgical History: Reviewed history from 09/26/2006 and no changes required. Caesarean section x 2 Rotator cuff repair  Family History: Reviewed history from 09/26/2006 and no changes required. Family History of Arthritis Family History High cholesterol  Social History: Reviewed history from 09/26/2006 and no changes required. Occupation: Married Never Smoked  Review of Systems       The patient complains of vision loss and headaches.  The patient denies anorexia, fever, weight loss, weight gain, decreased hearing, hoarseness, chest pain, syncope, dyspnea on exertion, peripheral edema,  prolonged cough, hemoptysis, abdominal pain, melena, hematochezia, severe indigestion/heartburn, hematuria, incontinence, genital sores, muscle weakness, suspicious skin lesions, transient blindness, difficulty walking, depression, unusual weight change, abnormal bleeding, enlarged lymph nodes, angioedema, breast masses, and testicular masses.    Physical Exam  General:  Well-developed,well-nourished,in no acute distress; alert,appropriate and cooperative throughout examination Head:  normocephalic, no abnormalities observed, and no abnormalities palpated.   Eyes:  pupils equal and pupils round.   Ears:  R ear normal and no external deformities.   Neck:  No deformities, masses, or tenderness noted. Lungs:  Normal respiratory effort, chest expands symmetrically. Lungs are clear to auscultation, no crackles or wheezes. Heart:  Normal rate and regular rhythm. S1 and S2 normal without gallop, murmur, click, rub or other extra sounds. Abdomen:  Bowel sounds positive,abdomen soft and  non-tender without masses, organomegaly or hernias noted.   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 refer to neurology for help        b. repeat the sed rate and consider medrol if greater than 50   medrol and MRA  ASAP  Orders: Venipuncture (16109) TLB-Sedimentation Rate (ESR) (85652-ESR) Neurology Referral (Neuro)  Complete Medication List: 1)  Prempro 0.625-5 Mg Tabs (Conj estrog-medroxyprogest ace) .... Once daily 2)  Multivitamins Tabs (Multiple vitamin) .... Once daily 3)  Benicar Hct 20-12.5 Mg Tabs (Olmesartan medoxomil-hctz) .... One by mouth dail 4)  Lovaza 1 Gm Caps (Omega-3-acid ethyl esters) .... Occassionally 5)  Optivar 0.05 % Soln (Azelastine hcl) .... One qtts ou bid 6)  Diprolene Af 0.05 % Crea (Aug betamethasone dipropionate) .... Apply to skin in afected areas 7)  Caltrate 600+d Plus 600-400 Mg-unit Chew (Calcium carbonate-vit d-min) .... Once daily 8)  Vitamin E 100 Unit/gm Crea (Vitamin e) .... Once daily 9)  Ativan 0.5 Mg Tabs (Lorazepam) .... One by mouth q 6 hour as needed 10)  Elestat 0.05 % Soln (Epinastine hcl) .... Use as directed to eyes 11)  Patanol 0.1 % Soln (Olopatadine hcl) .... One qtts ou bid 12)  Nasonex 50 Mcg/act Susp (Mometasone furoate) .... One spray two times a day q nare 13)  Lexapro 10 Mg Tabs (Escitalopram oxalate) .... 1/2 by mouth daily 14)  Asmanex 120 Metered Doses 220 Mcg/inh Aepb (Mometasone furoate) .... One puf dialy 15)  Proventil Hfa 108 (90 Base) Mcg/act Aers (Albuterol sulfate) .... 2 puff by mouth q4 16)  Chlorzoxazone 500 Mg Tabs (Chlorzoxazone) .... One by mouth bid 17)  Diclofenac Potassium 50 Mg Tabs (Diclofenac potassium) .... One by mouth two times a day 18)  Methylprednisolone 8 Mg Tabs (Methylprednisolone) .... One  by mouth two times a day for 5 days the one by mouth daily fro 5 days the 1/2 by mouth daily for 10 days  Patient Instructions: 1)  call in AM if you do not hear about the sed rate Prescriptions: METHYLPREDNISOLONE 8 MG TABS (METHYLPREDNISOLONE) one by mouth two times a day for 5 days the one by mouth daily fro 5 days the 1/2 by mouth daily for 10 days  #20 x 0   Entered and Authorized by:   Stacie Glaze MD   Signed by:   Stacie Glaze MD on 06/06/2009   Method used:   Print then Give to Patient   RxID:   [id number]

## 2010-03-30 NOTE — Progress Notes (Signed)
Summary: chest pain  Phone Note Call from Patient   Caller: Patient Call For: Stacie Glaze MD Reason for Call: Acute Illness Summary of Call: Pt had one episode of chest pain on left side with teeth pain and sweating yesterday x 5 minutes.  Was sitting at desk when this occurred.  [phone] Initial call taken by: Lynann Beaver CMA AAMA,  January 24, 2010 8:45 AM  Follow-up for Phone Call        per dr Lovell Sheehan- if she has anymore episodes he will have her scheduled for a cardiolite ,so have her let us know if it happens again Follow-up by: Willy Eddy, LPN,  January 24, 2010 9:33 AM  Additional Follow-up for Phone Call Additional follow up Details #1::        Left detailed message on pt's personal voice mail. Additional Follow-up by: Lynann Beaver CMA AAMA,  January 24, 2010 10:11 AM

## 2010-03-30 NOTE — Assessment & Plan Note (Signed)
Summary: headaches, vision issues x 3 days-ok per bonnye//ccm   Vital Signs:  Patient profile:   55 year old female Height:      66 inches Weight:      196 pounds BMI:     31.75 Temp:     98.2 degrees F oral Pulse rate:   72 / minute Resp:     14 per minute BP sitting:   120 / 70  (left arm)  Vitals Entered By: Willy Eddy, LPN (May 27, 1608 3:41 PM) CC: c/o frequent headaches with shadows in left eye, Hypertension Management   CC:  c/o frequent headaches with shadows in left eye and Hypertension Management.  History of Present Illness: The pt has a flair of symptoms: head aches  bi-temple pressure visual disturbance in the left eye that comes and goes and now see's black dots she awakens with a head ache for the last 4 days blood pressure is controlled the right eye is fine the black spot "move" with the visiion b she has no cuts in the visual field  Hypertension History:      Positive major cardiovascular risk factors include hyperlipidemia and hypertension.  Negative major cardiovascular risk factors include female age less than 88 years old and non-tobacco-user status.     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 Once Daily 2)  Multivitamins   Tabs (Multiple Vitamin) .... Once Daily 3)  Benicar Hct 20-12.5 Mg Tabs (Olmesartan Medoxomil-Hctz) .... One By Mouth Dail 4)  Lovaza 1 Gm  Caps (Omega-3-Acid Ethyl Esters) .... Occassionally 5)  Optivar 0.05 %  Soln (Azelastine Hcl) .... One Qtts Ou Bid 6)  Diprolene Af 0.05 %  Crea (Aug Betamethasone Dipropionate) .... Apply To Skin in Afected Areas 7)  Caltrate 600+d Plus 600-400 Mg-Unit  Chew (Calcium Carbonate-Vit D-Min) .... Once Daily 8)  Vitamin E 100 Unit/gm  Crea (Vitamin E) .... Once Daily 9)  Ativan 0.5 Mg  Tabs (Lorazepam) .... One By Mouth Q 6 Hour As Needed 10)  Elestat 0.05 %  Soln (Epinastine Hcl) .... Use As Directed To Eyes 11)  Patanol 0.1 %  Soln (Olopatadine Hcl) .... One Qtts Ou Bid 12)  Nasonex 50 Mcg/act  Susp (Mometasone Furoate) .... One Spray Two Times A Day Q Nare 13)  Lexapro 10 Mg Tabs (Escitalopram Oxalate) .... 1/2 By Mouth Daily 14)  Asmanex 120 Metered Doses 220 Mcg/inh Aepb (Mometasone Furoate) .... One Puf Dialy 15)  Proventil Hfa 108 (90 Base) Mcg/act  Aers (Albuterol Sulfate) .... 2 Puff By Mouth Q4 16)  Chlorzoxazone 500 Mg Tabs (Chlorzoxazone) .... One  By Mouth Bid 17)  Diclofenac Potassium 50 Mg Tabs (Diclofenac Potassium) .... One By Mouth Two Times A Day  Allergies (verified): No Known Drug Allergies  Past History:  Family History: Last updated: 09/26/2006 Family History of Arthritis Family History High cholesterol  Social History: Last updated: 09/26/2006 Occupation: Married Never Smoked  Risk Factors: Smoking Status: never (05/27/2009)  Past medical, surgical, family and social histories (including risk factors) reviewed, and no changes noted (except as noted below).  Past Medical History: Reviewed history from 12/31/2006 and no changes  required. Allergic rhinitis Depression Hypertension Hyperlipidemia  Past Surgical History: Reviewed history from 09/26/2006 and no changes required. Caesarean section x 2 Rotator cuff repair  Family History: Reviewed history from 09/26/2006 and no changes required. Family History of Arthritis Family History High cholesterol  Social History: Reviewed history from 09/26/2006 and no changes required. Occupation: Married Never Smoked  Review of Systems       The patient complains of vision loss.  The patient denies anorexia, fever, weight loss, weight gain, decreased hearing, hoarseness, chest pain, syncope, dyspnea on exertion, peripheral edema, prolonged cough, headaches, hemoptysis, abdominal pain, melena, hematochezia, severe indigestion/heartburn, hematuria, incontinence, genital sores, muscle weakness, suspicious skin lesions, transient blindness, difficulty walking, depression, unusual weight change, abnormal bleeding, enlarged lymph nodes, angioedema, and breast masses.    Physical Exam  General:  Well-developed,well-nourished,in no acute distress; alert,appropriate and cooperative throughout examination Head:  normocephalic, no abnormalities observed, and no abnormalities palpated.   Eyes:  pupils equal and pupils round.  no gross visual field cuts, defect in in left eye only cannot apprreaciate  disc bulging or any abnormal fundascopic exam Ears:  R ear normal and L ear normal.   Nose:  nasal dischargemucosal pallor, mucosal erythema, and mucosal edema.   Mouth:  Oral mucosa and oropharynx without lesions or exudates.  Teeth in good repair. Neck:  No deformities, masses, or tenderness noted. Lungs:  Normal respiratory effort, chest expands symmetrically. Lungs are clear to auscultation, no crackles or wheezes. Heart:  Normal rate and regular rhythm. S1 and S2 normal without gallop, murmur, click, rub or other extra sounds.   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 Occassionally  Labs Reviewed: SGOT: 22 (09/15/2008)   SGPT: 27 (09/15/2008)  Prior 10 Yr Risk Heart Disease: Not enough information (03/27/2007)   HDL:62.40 (09/15/2008)  Chol:222 (09/15/2008)  Trig:88.0 (09/15/2008)  Problem # 3:  HYPERTENSION (ICD-401.9)  Her updated medication list for this problem includes:    Benicar Hct 20-12.5 Mg Tabs (Olmesartan medoxomil-hctz) ..... One by mouth dail  BP today: 120/70 Prior BP: 122/80 (03/07/2009)  Prior 10 Yr Risk Heart Disease: Not enough information (03/27/2007)  Labs Reviewed: K+: 3.5 (09/15/2008) Creat: : 0.6 (09/15/2008)   Chol: 222 (09/15/2008)   HDL: 62.40 (09/15/2008)   TG: 88.0 (09/15/2008)  Complete Medication List: 1)  Prempro 0.625-5 Mg Tabs (Conj estrog-medroxyprogest ace) .... Once daily 2)  Multivitamins Tabs (Multiple vitamin) .... Once daily 3)  Benicar Hct 20-12.5 Mg Tabs (Olmesartan medoxomil-hctz) .... One by mouth dail 4)  Lovaza 1 Gm Caps (Omega-3-acid ethyl esters) .... Occassionally 5)  Optivar 0.05 % Soln (Azelastine hcl) .... One qtts ou bid 6)  Diprolene Af 0.05 % Crea (Aug betamethasone dipropionate) .... Apply to skin in afected areas 7)  Caltrate 600+d Plus 600-400 Mg-unit Chew (Calcium carbonate-vit d-min) .... Once daily 8)  Vitamin E 100 Unit/gm Crea (Vitamin e) .... Once daily 9)  Ativan 0.5 Mg Tabs (Lorazepam) .... One by mouth q 6 hour as needed 10)  Elestat 0.05 % Soln (Epinastine hcl)  .... Use as directed to eyes 11)  Patanol 0.1 % Soln (Olopatadine hcl) .... One qtts ou bid 12)  Nasonex 50 Mcg/act Susp (Mometasone furoate) .... One spray two times a day q nare 13)  Lexapro 10 Mg Tabs (Escitalopram oxalate) .... 1/2 by mouth daily 14)  Asmanex 120 Metered Doses 220 Mcg/inh Aepb (Mometasone furoate) .... One puf dialy 15)  Proventil Hfa 108 (90 Base) Mcg/act Aers (Albuterol sulfate) .... 2 puff by mouth q4 16)  Chlorzoxazone 500 Mg Tabs (Chlorzoxazone) .... One by mouth bid 17)  Diclofenac Potassium 50 Mg Tabs (Diclofenac potassium) .... One by mouth two times a day  Hypertension Assessment/Plan:      The patient's hypertensive risk group is category B: At least one risk factor (excluding diabetes) with no target organ damage.  Today's blood pressure is 120/70.  Her blood pressure goal is < 140/90.  Patient Instructions: 1)  need dilated exam ASAP

## 2010-05-16 ENCOUNTER — Other Ambulatory Visit: Payer: Self-pay | Admitting: *Deleted

## 2010-05-16 MED ORDER — PHENTERMINE HCL 37.5 MG PO CAPS: 37.5000 mg | ORAL_CAPSULE | ORAL | Status: DC

## 2010-05-23 ENCOUNTER — Encounter: Payer: Self-pay | Admitting: Internal Medicine

## 2010-05-24 ENCOUNTER — Encounter: Payer: Self-pay | Admitting: Internal Medicine

## 2010-05-24 ENCOUNTER — Ambulatory Visit (INDEPENDENT_AMBULATORY_CARE_PROVIDER_SITE_OTHER): Payer: BC Managed Care – PPO | Admitting: Internal Medicine

## 2010-05-24 VITALS — BP 124/80 | HR 76 | Temp 98.2°F | Resp 14 | Ht 64.0 in | Wt 202.0 lb

## 2010-05-24 DIAGNOSIS — L408 Other psoriasis: Secondary | ICD-10-CM

## 2010-05-24 DIAGNOSIS — R5383 Other fatigue: Secondary | ICD-10-CM

## 2010-05-24 DIAGNOSIS — R5381 Other malaise: Secondary | ICD-10-CM

## 2010-05-24 DIAGNOSIS — IMO0001 Reserved for inherently not codable concepts without codable children: Secondary | ICD-10-CM

## 2010-05-24 DIAGNOSIS — M791 Myalgia, unspecified site: Secondary | ICD-10-CM

## 2010-05-24 DIAGNOSIS — M79609 Pain in unspecified limb: Secondary | ICD-10-CM

## 2010-05-24 LAB — CK: Total CK: 31 U/L (ref 7–177)

## 2010-05-24 MED ORDER — DULOXETINE HCL 60 MG PO CPEP: 60.0000 mg | ORAL_CAPSULE | Freq: Every day | ORAL | Status: DC

## 2010-05-24 NOTE — Assessment & Plan Note (Signed)
Seeing podiatry ( [facility]) Has plantar faciatitis

## 2010-05-24 NOTE — Progress Notes (Signed)
  Subjective:    Patient ID: Laura Green, female    DOB: Jul 11, 1955, 55 y.o.   MRN: [id number]  HPI patient is a 55 year old Hispanic female who presents for followup of her asthma and hypertension allergic rhinitis asthma flare of allergic rhinitis has been taking Zyrtec once a day however her primary complaint today is a sense of fatigue myalgias and extreme pain in her joints and around her joints.  She states it is painful to walk well over in bed move any of her muscles she has extreme fatigue she states that this is new she has no rashes or or other physical symptoms other than the pain and tenderness    Review of Systems  Constitutional: Negative for activity change, appetite change and fatigue.  HENT: Negative for ear pain, congestion, neck pain, postnasal drip and sinus pressure.   Eyes: Negative for redness and visual disturbance.  Respiratory: Negative for cough, shortness of breath and wheezing.   Gastrointestinal: Negative for abdominal pain and abdominal distention.  Genitourinary: Negative for dysuria, frequency and menstrual problem.  Musculoskeletal: Negative for myalgias, joint swelling and arthralgias.  Skin: Negative for rash and wound.  Neurological: Negative for dizziness, weakness and headaches.  Hematological: Negative for adenopathy. Does not bruise/bleed easily.  Psychiatric/Behavioral: Negative for sleep disturbance and decreased concentration.       Objective:   Physical Exam    Blood pressure 124/80, pulse 76, temperature 98.2 F (36.8 C), temperature source Oral, resp. rate 14, height 5\' 4"  (1.626 m), weight 202 lb (91.627 kg). On physical examination she is a pleasant female in apparent pain but pressure was normal at 124/80 she is afebrile examination revealed the pupils are equal round reactive to light and accommodation heart was normal lungs were clear to auscultation there was no edema muscular skeletal examination however showed marked. Point  tenderness in all 9 areas of fibromyalgia and this is beginning to impair her functioning    Assessment & Plan:  Rule out fibromyalgia as the diagnosis for her myositis and pain syndrome thyroid sedimentation rate appropriate blood studies will be obtained today and we'll begin a trial of Cymbalta 60 mg by mouth daily we will followup in 6 weeks' time to see if the Cymbalta treats the pain syndrome that would be presumptively more fibromyalgia if other laboratory values are out of normal we will investigate potential differential diagnoses

## 2010-05-24 NOTE — Assessment & Plan Note (Signed)
She has not had any recurrent rash

## 2010-05-26 ENCOUNTER — Other Ambulatory Visit: Payer: Self-pay | Admitting: *Deleted

## 2010-05-26 DIAGNOSIS — R700 Elevated erythrocyte sedimentation rate: Secondary | ICD-10-CM

## 2010-05-26 MED ORDER — PREDNISONE (PAK) 10 MG PO TABS: 10.0000 mg | ORAL_TABLET | Freq: Every day | ORAL | Status: AC

## 2010-06-27 ENCOUNTER — Encounter: Payer: Self-pay | Admitting: Internal Medicine

## 2010-07-05 ENCOUNTER — Encounter: Payer: Self-pay | Admitting: Internal Medicine

## 2010-07-05 ENCOUNTER — Ambulatory Visit (INDEPENDENT_AMBULATORY_CARE_PROVIDER_SITE_OTHER): Payer: BC Managed Care – PPO | Admitting: Internal Medicine

## 2010-07-05 DIAGNOSIS — J45991 Cough variant asthma: Secondary | ICD-10-CM

## 2010-07-05 DIAGNOSIS — E785 Hyperlipidemia, unspecified: Secondary | ICD-10-CM

## 2010-07-05 DIAGNOSIS — I10 Essential (primary) hypertension: Secondary | ICD-10-CM

## 2010-07-05 DIAGNOSIS — J309 Allergic rhinitis, unspecified: Secondary | ICD-10-CM

## 2010-07-05 DIAGNOSIS — H47019 Ischemic optic neuropathy, unspecified eye: Secondary | ICD-10-CM

## 2010-07-05 NOTE — Assessment & Plan Note (Signed)
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

## 2010-07-05 NOTE — Assessment & Plan Note (Signed)
Shows excellent

## 2010-07-05 NOTE — Assessment & Plan Note (Signed)
Her breathing is good and  she's had no recent asthma attack

## 2010-07-06 NOTE — Progress Notes (Signed)
Subjective:    Patient ID: Laura Green, female    DOB: 05/27/1955, 55 y.o.   MRN: [id number]  HPI Patient has a year-old white female who presents for followup of hypertension and history of generalized body pain though to be there osteoarthritis or polymyalgia rheumatica.  She also has moderately severe allergic rhinitis with allergic conjunctivitis that is chronic in nature.  Recently she was monitored with a sedimentation rate that was elevated and given a prednisone course she states that she felt exceptionally well following prednisone and that much of the muscle pains have reduced after treatment.  Today she will require a sedimentation rate to monitor her for a polymyalgia-type syndrome.   Review of Systems  Constitutional: Negative for activity change, appetite change and fatigue.  HENT: Negative for ear pain, congestion, neck pain, postnasal drip and sinus pressure.   Eyes: Negative for redness and visual disturbance.  Respiratory: Negative for cough, shortness of breath and wheezing.   Gastrointestinal: Negative for abdominal pain and abdominal distention.  Genitourinary: Negative for dysuria, frequency and menstrual problem.  Musculoskeletal: Negative for myalgias, joint swelling and arthralgias.  Skin: Negative for rash and wound.  Neurological: Negative for dizziness, weakness and headaches.  Hematological: Negative for adenopathy. Does not bruise/bleed easily.  Psychiatric/Behavioral: Negative for sleep disturbance and decreased concentration.   Past Medical History  Diagnosis Date  . Allergy   . Depression   . Hypertension   . Hyperlipidemia   . Concussion with no loss of consciousness 04/01/2008   Past Surgical History  Procedure Date  . Cesarean section   . Rotator cuff repair     reports that she has never smoked. She does not have any smokeless tobacco history on file. She reports that she does not drink alcohol or use illicit drugs. family history  includes Arthritis in her mother; COPD in her father; and Hyperlipidemia in her father. No Known Allergies     Objective:   Physical Exam  Constitutional: She is oriented to person, place, and time. She appears well-developed and well-nourished. No distress.  HENT:  Head: Normocephalic and atraumatic.  Right Ear: External ear normal.  Left Ear: External ear normal.  Nose: Nose normal.  Mouth/Throat: Oropharynx is clear and moist.  Eyes: Conjunctivae and EOM are normal. Pupils are equal, round, and reactive to light.  Neck: Normal range of motion. Neck supple. No JVD present. No tracheal deviation present. No thyromegaly present.  Cardiovascular: Normal rate, regular rhythm, normal heart sounds and intact distal pulses.   No murmur heard. Pulmonary/Chest: Effort normal and breath sounds normal. She has no wheezes. She exhibits no tenderness.  Abdominal: Soft. Bowel sounds are normal.  Musculoskeletal: Normal range of motion. She exhibits no edema and no tenderness.  Lymphadenopathy:    She has no cervical adenopathy.  Neurological: She is alert and oriented to person, place, and time. She has normal reflexes. No cranial nerve deficit.  Skin: Skin is warm and dry. She is not diaphoretic.  Psychiatric: She has a normal mood and affect. Her behavior is normal.          Assessment & Plan:  The patient will have a sedimentation rate today if her sedimentation rate is greater than 50 we'll consider additional prednisone and a referral to rheumatologist for evaluation.  Her blood pressure is stable her allergies are stable at this time continue current protocol  I have spent more than 30 minutes examining this patient face-to-face of which over half was spent in  counseling

## 2010-07-17 ENCOUNTER — Telehealth: Payer: Self-pay | Admitting: *Deleted

## 2010-07-17 NOTE — Telephone Encounter (Signed)
Per dr Lovell Sheehan- h ave her come for another sed rate and let her kn ow if still elevated, may be pmr and she will have to have referral to rheumatologist--prt informed and will com on Thursday for another sed rateb

## 2010-07-17 NOTE — Telephone Encounter (Signed)
patient  Is calling because the Cymbalta is no longer helping.  She has body aches all over.  The Cymbalta was working well while taking prednisone.  Should she try something else?  She would also like her lab results.

## 2010-07-17 NOTE — Telephone Encounter (Signed)
Pt informed

## 2010-07-20 ENCOUNTER — Other Ambulatory Visit (INDEPENDENT_AMBULATORY_CARE_PROVIDER_SITE_OTHER): Payer: BC Managed Care – PPO

## 2010-07-20 DIAGNOSIS — H47019 Ischemic optic neuropathy, unspecified eye: Secondary | ICD-10-CM

## 2010-07-28 ENCOUNTER — Other Ambulatory Visit: Payer: Self-pay | Admitting: Internal Medicine

## 2010-07-28 DIAGNOSIS — R700 Elevated erythrocyte sedimentation rate: Secondary | ICD-10-CM

## 2010-08-01 ENCOUNTER — Encounter: Payer: Self-pay | Admitting: Internal Medicine

## 2010-08-01 ENCOUNTER — Ambulatory Visit (INDEPENDENT_AMBULATORY_CARE_PROVIDER_SITE_OTHER): Payer: BC Managed Care – PPO | Admitting: Internal Medicine

## 2010-08-01 VITALS — BP 136/84 | HR 76 | Temp 98.8°F | Resp 16 | Ht 64.0 in | Wt 204.0 lb

## 2010-08-01 DIAGNOSIS — M130 Polyarthritis, unspecified: Secondary | ICD-10-CM

## 2010-08-01 DIAGNOSIS — F32A Depression, unspecified: Secondary | ICD-10-CM

## 2010-08-01 DIAGNOSIS — F329 Major depressive disorder, single episode, unspecified: Secondary | ICD-10-CM

## 2010-08-01 DIAGNOSIS — M79609 Pain in unspecified limb: Secondary | ICD-10-CM

## 2010-08-01 MED ORDER — MIRTAZAPINE 30 MG PO TBDP: 30.0000 mg | ORAL_TABLET | Freq: Every day | ORAL | Status: AC

## 2010-08-01 NOTE — Progress Notes (Signed)
  Subjective:    Patient ID: Laura Green, female    DOB: Dec 11, 1955, 55 y.o.   MRN: [id number]  HPI Hx of periodic joint swelling of hand and feet with transient elevations of sed rate. Negative RF in past. Family hx positive for RA. She has severe allergies as well as a hx of foot surgery with pins ( left foot)     Review of Systems  Constitutional: Negative for activity change, appetite change and fatigue.  HENT: Negative for ear pain, congestion, neck pain, postnasal drip and sinus pressure.   Eyes: Negative for redness and visual disturbance.  Respiratory: Negative for cough, shortness of breath and wheezing.   Gastrointestinal: Negative for abdominal pain and abdominal distention.  Genitourinary: Negative for dysuria, frequency and menstrual problem.  Musculoskeletal: Positive for myalgias, joint swelling and arthralgias.  Skin: Negative for rash and wound.  Neurological: Negative for dizziness, weakness and headaches.  Hematological: Negative for adenopathy. Does not bruise/bleed easily.  Psychiatric/Behavioral: Negative for sleep disturbance and decreased concentration.   Past Medical History  Diagnosis Date  . Allergy   . Depression   . Hypertension   . Hyperlipidemia   . Concussion with no loss of consciousness 04/01/2008   Past Surgical History  Procedure Date  . Cesarean section   . Rotator cuff repair     reports that she has never smoked. She does not have any smokeless tobacco history on file. She reports that she does not drink alcohol or use illicit drugs. family history includes Arthritis in her mother; COPD in her father; and Hyperlipidemia in her father. No Known Allergies     Objective:   Physical Exam  Nursing note and vitals reviewed. Constitutional: She is oriented to person, place, and time. She appears well-developed and well-nourished. No distress.  HENT:  Head: Normocephalic and atraumatic.  Right Ear: External ear normal.  Left Ear:  External ear normal.  Nose: Nose normal.  Mouth/Throat: Oropharynx is clear and moist.  Eyes: Conjunctivae and EOM are normal. Pupils are equal, round, and reactive to light.  Neck: Normal range of motion. Neck supple. No JVD present. No tracheal deviation present. No thyromegaly present.  Cardiovascular: Normal rate, regular rhythm, normal heart sounds and intact distal pulses.   No murmur heard. Pulmonary/Chest: Effort normal and breath sounds normal. She has no wheezes. She exhibits no tenderness.  Abdominal: Soft. Bowel sounds are normal.  Musculoskeletal: She exhibits tenderness. She exhibits no edema.  Lymphadenopathy:    She has no cervical adenopathy.  Neurological: She is alert and oriented to person, place, and time. She has normal reflexes. No cranial nerve deficit.  Skin: Skin is warm and dry. She is not diaphoretic.  Psychiatric: She has a normal mood and affect. Her behavior is normal.          Assessment & Plan:  Blood pressure is stable.  She is a long history of 3 years now of periodic swelling of her hands and feet and elevations of sedimentation rate to slightly above 50.  In the past rheumatoid evaluation said the negative but the persistence of the elevation of her sedimentation rate and the response to the prednisone we just do a referral to rheumatology to rule out seronegative rheumatoid arthritis.  There is a family history of arthritis that was joint deforming.  She has moderate to severe allergic rhinitis affects both her eyes and her upper respiratory tract

## 2010-08-02 LAB — RHEUMATOID FACTOR: Rhuematoid fact SerPl-aCnc: <10 IU/mL (ref <=14)

## 2010-09-04 ENCOUNTER — Ambulatory Visit: Payer: BC Managed Care – PPO | Admitting: Internal Medicine

## 2010-09-14 ENCOUNTER — Telehealth: Payer: Self-pay

## 2010-09-14 NOTE — Telephone Encounter (Signed)
Pt requesting samples of benicar 20 or 40 mg.  Pt notified that we have no regular benicar samples. Notes that she will check to see how much she has and call back if she needs an Rx

## 2010-09-20 ENCOUNTER — Telehealth: Payer: Self-pay | Admitting: *Deleted

## 2010-09-20 MED ORDER — LOSARTAN POTASSIUM 100 MG PO TABS: 100.0000 mg | ORAL_TABLET | Freq: Every day | ORAL | Status: DC

## 2010-09-20 NOTE — Telephone Encounter (Signed)
Pt would like a generic brand of RX similar to Benicar 20 mg.  [facility].

## 2010-09-20 NOTE — Telephone Encounter (Signed)
May change to cozaar 100 qd

## 2010-09-20 NOTE — Telephone Encounter (Signed)
Rx sent and pt notified.

## 2010-10-06 ENCOUNTER — Ambulatory Visit: Payer: BC Managed Care – PPO | Admitting: Internal Medicine

## 2010-11-17 ENCOUNTER — Ambulatory Visit (INDEPENDENT_AMBULATORY_CARE_PROVIDER_SITE_OTHER): Payer: BC Managed Care – PPO | Admitting: Internal Medicine

## 2010-11-17 VITALS — BP 142/80 | HR 76 | Temp 98.3°F | Resp 16 | Ht 64.0 in | Wt 212.0 lb

## 2010-11-17 DIAGNOSIS — R635 Abnormal weight gain: Secondary | ICD-10-CM

## 2010-11-17 DIAGNOSIS — I10 Essential (primary) hypertension: Secondary | ICD-10-CM

## 2010-11-17 DIAGNOSIS — M353 Polymyalgia rheumatica: Secondary | ICD-10-CM

## 2010-11-17 DIAGNOSIS — R51 Headache: Secondary | ICD-10-CM

## 2010-11-17 DIAGNOSIS — R519 Headache, unspecified: Secondary | ICD-10-CM

## 2010-11-17 MED ORDER — PHENTERMINE HCL 37.5 MG PO CAPS: 37.5000 mg | ORAL_CAPSULE | ORAL | Status: DC

## 2010-11-17 MED ORDER — TOPIRAMATE 25 MG PO TABS: 25.0000 mg | ORAL_TABLET | Freq: Two times a day (BID) | ORAL | Status: DC

## 2010-11-17 NOTE — Progress Notes (Signed)
Subjective:    Patient ID: Laura Green, female    DOB: 03/28/1955, 55 y.o.   MRN: [id number]  HPI  Weight gain Elevated blood pressure reading Patient states that she has not been able to exercise due to muscular pain.  She describes the pain is more diffuse involving both upper and lower extremities and tenderness both in the muscles and in the joints.  She has been evaluated for rheumatoid arthritis in the past and is on prednisone from the rheumatologist for a polymyalgia rheumatica type presentation.    Review of Systems  Constitutional: Positive for activity change, appetite change and unexpected weight change. Negative for fatigue.  HENT: Negative for ear pain, congestion, neck pain, postnasal drip and sinus pressure.   Eyes: Negative for redness and visual disturbance.  Respiratory: Negative for cough, shortness of breath and wheezing.   Gastrointestinal: Negative for abdominal pain and abdominal distention.  Genitourinary: Negative for dysuria, frequency and menstrual problem.  Musculoskeletal: Negative for myalgias, joint swelling and arthralgias.  Skin: Negative for rash and wound.  Neurological: Negative for dizziness, weakness and headaches.  Hematological: Negative for adenopathy. Does not bruise/bleed easily.  Psychiatric/Behavioral: Negative for sleep disturbance and decreased concentration.   Past Medical History  Diagnosis Date  . Allergy   . Depression   . Hypertension   . Hyperlipidemia   . Concussion with no loss of consciousness 04/01/2008   Past Surgical History  Procedure Date  . Cesarean section   . Rotator cuff repair     reports that she has never smoked. She does not have any smokeless tobacco history on file. She reports that she does not drink alcohol or use illicit drugs. family history includes Arthritis in her mother; COPD in her father; and Hyperlipidemia in her father. No Known Allergies     Objective:   Physical Exam  Vitals  reviewed. Constitutional: She is oriented to person, place, and time. She appears well-developed and well-nourished. No distress.  HENT:  Head: Normocephalic and atraumatic.  Right Ear: External ear normal.  Left Ear: External ear normal.  Nose: Nose normal.  Mouth/Throat: Oropharynx is clear and moist.  Eyes: Conjunctivae and EOM are normal. Pupils are equal, round, and reactive to light.  Neck: Normal range of motion. Neck supple. No JVD present. No tracheal deviation present. No thyromegaly present.  Cardiovascular: Normal rate, regular rhythm, normal heart sounds and intact distal pulses.   No murmur heard. Pulmonary/Chest: Effort normal and breath sounds normal. She has no wheezes. She exhibits no tenderness.  Abdominal: Soft. Bowel sounds are normal.  Musculoskeletal: Normal range of motion. She exhibits no edema and no tenderness.  Lymphadenopathy:    She has no cervical adenopathy.  Neurological: She is alert and oriented to person, place, and time. She has normal reflexes. No cranial nerve deficit.  Skin: Skin is warm and dry. She is not diaphoretic.  Psychiatric: She has a normal mood and affect. Her behavior is normal.          Assessment & Plan:  Plan nontraumatic on low-dose prednisone monitor sedimentation rate to see prednisone should be titrated due to her increased symptomology however this is a case where the increased prednisone could complicate her weight gain her appetite.  Weight loss is essential to control for strong cardiac risk for hypertension and hyperlipidemia as well as diabetes his significant family risk.  We will put her on a phentermine and Topamax protocol and monitor her carefully over the next 2 months side  effects and possible resistance to the medications were discussed with the patient

## 2010-11-23 ENCOUNTER — Encounter: Payer: Self-pay | Admitting: Internal Medicine

## 2010-11-23 NOTE — Patient Instructions (Signed)
Patient was instructed to continue all medications as prescribed. To stop at the checkout desk and schedule a followup appointment  

## 2011-01-22 ENCOUNTER — Ambulatory Visit (INDEPENDENT_AMBULATORY_CARE_PROVIDER_SITE_OTHER): Payer: BC Managed Care – PPO | Admitting: Internal Medicine

## 2011-01-22 ENCOUNTER — Encounter: Payer: Self-pay | Admitting: Internal Medicine

## 2011-01-22 VITALS — BP 120/80 | HR 72 | Temp 98.3°F | Resp 16 | Ht 64.0 in | Wt 192.0 lb

## 2011-01-22 DIAGNOSIS — I10 Essential (primary) hypertension: Secondary | ICD-10-CM

## 2011-01-22 DIAGNOSIS — R519 Headache, unspecified: Secondary | ICD-10-CM

## 2011-01-22 DIAGNOSIS — R51 Headache: Secondary | ICD-10-CM

## 2011-01-22 DIAGNOSIS — E669 Obesity, unspecified: Secondary | ICD-10-CM

## 2011-01-22 MED ORDER — TOPIRAMATE 25 MG PO TABS: 25.0000 mg | ORAL_TABLET | Freq: Two times a day (BID) | ORAL | Status: DC

## 2011-01-22 MED ORDER — PHENTERMINE HCL 37.5 MG PO CAPS: 37.5000 mg | ORAL_CAPSULE | ORAL | Status: DC

## 2011-01-22 NOTE — Patient Instructions (Addendum)
The patient is instructed to continue all medications as prescribed. Schedule followup with check out clerk upon leaving the clinic Stop the prednisone

## 2011-01-22 NOTE — Progress Notes (Signed)
Subjective:    Patient ID: Laura Green, female    DOB: 12/22/1955, 55 y.o.   MRN: [id number]  HPI Pt has lost 22 lbs She did well on the diet she states that the combination of the Topamax and the phentermine been highly successful she understands the risks and benefits of using this drug before full FDA approval of the combination and she stated a willingness to continue the drug under physician supervision. She denies any tremors seizure like activities headaches She states that she feels much better after the weight loss with improved hypertension improved verrucous veins and improved osteoarthritic pain    Review of Systems  Constitutional: Negative for activity change, appetite change and fatigue.  HENT: Negative for ear pain, congestion, neck pain, postnasal drip and sinus pressure.   Eyes: Negative for redness and visual disturbance.  Respiratory: Negative for cough, shortness of breath and wheezing.   Gastrointestinal: Negative for abdominal pain and abdominal distention.  Genitourinary: Negative for dysuria, frequency and menstrual problem.  Musculoskeletal: Negative for myalgias, joint swelling and arthralgias.  Skin: Negative for rash and wound.  Neurological: Negative for dizziness, weakness and headaches.  Hematological: Negative for adenopathy. Does not bruise/bleed easily.  Psychiatric/Behavioral: Negative for sleep disturbance and decreased concentration.   Past Medical History  Diagnosis Date  . Allergy   . Depression   . Hypertension   . Hyperlipidemia   . Concussion with no loss of consciousness 04/01/2008    History   Social History  . Marital Status: Married    Spouse Name: N/A    Number of Children: N/A  . Years of Education: N/A   Occupational History  . Not on file.   Social History Main Topics  . Smoking status: Never Smoker   . Smokeless tobacco: Not on file  . Alcohol Use: No  . Drug Use: No  . Sexually Active: Yes   Other Topics  Concern  . Not on file   Social History Narrative  . No narrative on file    Past Surgical History  Procedure Date  . Cesarean section   . Rotator cuff repair     Family History  Problem Relation Age of Onset  . Arthritis Mother   . COPD Father   . Hyperlipidemia Father     No Known Allergies  Current Outpatient Prescriptions on File Prior to Visit  Medication Sig Dispense Refill  . albuterol (PROVENTIL HFA) 108 (90 BASE) MCG/ACT inhaler Inhale 2 puffs into the lungs every 4 (four) hours as needed.        Marland Kitchen augmented betamethasone dipropionate (DIPROLENE-AF) 0.05 % cream Apply topically 2 (two) times daily.        . beclomethasone (QVAR) 80 MCG/ACT inhaler Inhale 1 puff into the lungs 2 (two) times daily.        . Calcium Carbonate-Vitamin D (CALTRATE 600+D) 600-400 MG-UNIT per chew tablet Chew 1 tablet by mouth daily.        . mometasone (NASONEX) 50 MCG/ACT nasal spray 1 spray by Nasal route 2 (two) times daily.        . Multiple Vitamin (MULTIVITAMIN) capsule Take 1 capsule by mouth daily.        Marland Kitchen olmesartan (BENICAR) 20 MG tablet Take 20 mg by mouth daily.        . predniSONE (DELTASONE) 5 MG tablet Take 5 mg by mouth daily.        . vitamin E 100 UNIT capsule Take 100 Units by mouth  daily.        . phentermine 37.5 MG capsule Take 1 capsule (37.5 mg total) by mouth every morning.  30 capsule  2  . phentermine 37.5 MG capsule Take 1 capsule (37.5 mg total) by mouth every morning.  30 capsule  3    BP 120/80  Pulse 72  Temp 98.3 F (36.8 C)  Resp 16  Ht 5\' 4"  (1.626 m)  Wt 192 lb (87.091 kg)  BMI 32.96 kg/m2        Objective:   Physical Exam  Vitals reviewed. Constitutional: She is oriented to person, place, and time. She appears well-developed and well-nourished. No distress.  HENT:  Head: Normocephalic and atraumatic.  Right Ear: External ear normal.  Left Ear: External ear normal.  Nose: Nose normal.  Mouth/Throat: Oropharynx is clear and moist.    Eyes: Conjunctivae and EOM are normal. Pupils are equal, round, and reactive to light.  Neck: Normal range of motion. Neck supple. No JVD present. No tracheal deviation present. No thyromegaly present.  Cardiovascular: Normal rate, regular rhythm, normal heart sounds and intact distal pulses.   No murmur heard. Pulmonary/Chest: Effort normal and breath sounds normal. She has no wheezes. She exhibits no tenderness.  Abdominal: Soft. Bowel sounds are normal.  Musculoskeletal: Normal range of motion. She exhibits no edema and no tenderness.  Lymphadenopathy:    She has no cervical adenopathy.  Neurological: She is alert and oriented to person, place, and time. She has normal reflexes. No cranial nerve deficit.  Skin: Skin is warm and dry. She is not diaphoretic.  Psychiatric: She has a normal mood and affect. Her behavior is normal.          Assessment & Plan:  Continue careful monitoring of weight loss protocol on drugs as aforementioned Report monthly weights Followup in 3 months Reviewed drug side effects and risks Informed consent obtained Hypertension stable

## 2011-02-08 ENCOUNTER — Telehealth: Payer: Self-pay | Admitting: Internal Medicine

## 2011-02-08 NOTE — Telephone Encounter (Signed)
Pulled from Triage vmail. Pt has hacking cough - productive with green-yellow phlegm. Wants advice on treatment or OV. Please call

## 2011-02-08 NOTE — Telephone Encounter (Signed)
Left message on machine Try mucinex fast max cougha nd cold or call back and give her an appointment with new pa

## 2011-03-14 ENCOUNTER — Other Ambulatory Visit: Payer: Self-pay | Admitting: *Deleted

## 2011-03-14 MED ORDER — PHENTERMINE HCL 37.5 MG PO CAPS: 37.5000 mg | ORAL_CAPSULE | ORAL | Status: DC

## 2011-04-25 ENCOUNTER — Ambulatory Visit: Payer: BC Managed Care – PPO | Admitting: Internal Medicine

## 2011-04-27 ENCOUNTER — Ambulatory Visit (INDEPENDENT_AMBULATORY_CARE_PROVIDER_SITE_OTHER): Payer: BC Managed Care – PPO | Admitting: Internal Medicine

## 2011-04-27 VITALS — BP 136/80 | HR 72 | Temp 98.3°F | Resp 16 | Ht 64.0 in | Wt 180.0 lb

## 2011-04-27 DIAGNOSIS — E669 Obesity, unspecified: Secondary | ICD-10-CM

## 2011-04-27 DIAGNOSIS — I10 Essential (primary) hypertension: Secondary | ICD-10-CM

## 2011-04-27 DIAGNOSIS — J019 Acute sinusitis, unspecified: Secondary | ICD-10-CM

## 2011-04-27 MED ORDER — AMOXICILLIN-POT CLAVULANATE 875-125 MG PO TABS: 1.0000 | ORAL_TABLET | Freq: Two times a day (BID) | ORAL | Status: AC

## 2011-04-27 NOTE — Patient Instructions (Signed)
Salt water  Gargle 1 tsp of in a cup of warm water

## 2011-04-27 NOTE — Progress Notes (Signed)
Subjective:    Patient ID: Laura Green, female    DOB: 1955/11/15, 56 y.o.   MRN: [id number]  HPI  Weight loss followup on combination therapy of Topamax and phentermine She is tolerating the medications well without any side effects. Her blood pressure is well controlled her asthma is well-controlled she has had significant weight loss with this regimen She has an acute symptom of a persistent cough Hx of allergies and asthma with increased nasal conjestion And symptoms of possible acute sinusitis  Review of Systems  Constitutional: Negative for activity change, appetite change and fatigue.  HENT: Positive for congestion, rhinorrhea and postnasal drip. Negative for ear pain, neck pain and sinus pressure.   Eyes: Negative for redness and visual disturbance.  Respiratory: Positive for cough. Negative for shortness of breath and wheezing.   Gastrointestinal: Negative for abdominal pain and abdominal distention.  Genitourinary: Negative for dysuria, frequency and menstrual problem.  Musculoskeletal: Negative for myalgias, joint swelling and arthralgias.  Skin: Negative for rash and wound.  Neurological: Negative for dizziness, weakness and headaches.  Hematological: Negative for adenopathy. Does not bruise/bleed easily.  Psychiatric/Behavioral: Negative for sleep disturbance and decreased concentration.   Past Medical History  Diagnosis Date  . Allergy   . Depression   . Hypertension   . Hyperlipidemia   . Concussion with no loss of consciousness 04/01/2008    History   Social History  . Marital Status: Married    Spouse Name: N/A    Number of Children: N/A  . Years of Education: N/A   Occupational History  . Not on file.   Social History Main Topics  . Smoking status: Never Smoker   . Smokeless tobacco: Not on file  . Alcohol Use: No  . Drug Use: No  . Sexually Active: Yes   Other Topics Concern  . Not on file   Social History Narrative  . No narrative on  file    Past Surgical History  Procedure Date  . Cesarean section   . Rotator cuff repair     Family History  Problem Relation Age of Onset  . Arthritis Mother   . COPD Father   . Hyperlipidemia Father     Allergies  Allergen Reactions  . Augmentin (Amoxicillin-Pot Clavulanate) Rash    Current Outpatient Prescriptions on File Prior to Visit  Medication Sig Dispense Refill  . albuterol (PROVENTIL HFA) 108 (90 BASE) MCG/ACT inhaler Inhale 2 puffs into the lungs every 4 (four) hours as needed.        Marland Kitchen augmented betamethasone dipropionate (DIPROLENE-AF) 0.05 % cream Apply topically 2 (two) times daily.        . beclomethasone (QVAR) 80 MCG/ACT inhaler Inhale 1 puff into the lungs 2 (two) times daily.        . Calcium Carbonate-Vitamin D (CALTRATE 600+D) 600-400 MG-UNIT per chew tablet Chew 1 tablet by mouth daily.        . mometasone (NASONEX) 50 MCG/ACT nasal spray 1 spray by Nasal route 2 (two) times daily.        . Multiple Vitamin (MULTIVITAMIN) capsule Take 1 capsule by mouth daily.        Marland Kitchen olmesartan (BENICAR) 20 MG tablet Take 20 mg by mouth daily.        . predniSONE (DELTASONE) 5 MG tablet Take 5 mg by mouth daily.        Marland Kitchen topiramate (TOPAMAX) 25 MG tablet Take 1 tablet (25 mg total) by mouth 2 (two) times  daily.  60 tablet  6  . vitamin E 100 UNIT capsule Take 100 Units by mouth daily.          BP 136/80  Pulse 72  Temp 98.3 F (36.8 C)  Resp 16  Ht 5\' 4"  (1.626 m)  Wt 180 lb (81.647 kg)  BMI 30.90 kg/m2       Objective:   Physical Exam  Nursing note and vitals reviewed. Constitutional: She is oriented to person, place, and time. She appears well-developed and well-nourished. No distress.  HENT:  Head: Normocephalic and atraumatic.       Turbinates are erythematous with white mucoid discharge there is posterior cobblestoning of the naso-- pharyngeal wall  Eyes: Conjunctivae and EOM are normal. Pupils are equal, round, and reactive to light.  Neck:  Normal range of motion. Neck supple. No JVD present. No tracheal deviation present. No thyromegaly present.  Cardiovascular: Normal rate, regular rhythm, normal heart sounds and intact distal pulses.   No murmur heard. Pulmonary/Chest: Effort normal and breath sounds normal. She has no wheezes. She exhibits no tenderness.  Abdominal: Soft. Bowel sounds are normal.  Musculoskeletal: Normal range of motion. She exhibits no edema and no tenderness.  Lymphadenopathy:    She has no cervical adenopathy.  Neurological: She is alert and oriented to person, place, and time. She has normal reflexes. No cranial nerve deficit.  Skin: Skin is warm and dry. She is not diaphoretic.  Psychiatric: She has a normal mood and affect. Her behavior is normal.          Assessment & Plan:  We will continue for additional 3 months on the weight loss protocol with the 2 medications and then consider her a maintenance program.  Her blood pressure is well controlled on the current medications and we may be able to titrate down to 10 mg of Benicar.  She has allergic rhinitis with flare 2 to early allergens but her asthma appears to be stable we discussed continuing to Qvar as a maintenance drug and increasing temporarily her use of Nasonex for seasonal allergies  She has acute sinusitis we will treat with an antibiotic course.

## 2011-05-01 ENCOUNTER — Telehealth: Payer: Self-pay | Admitting: *Deleted

## 2011-05-01 MED ORDER — DOXYCYCLINE HYCLATE 100 MG PO TABS: 100.0000 mg | ORAL_TABLET | Freq: Two times a day (BID) | ORAL | Status: AC

## 2011-05-01 NOTE — Telephone Encounter (Signed)
Notified pt and pharmacy notified.

## 2011-05-01 NOTE — Telephone Encounter (Signed)
Per dr Lovell Sheehan- stop augmentin- start doxycycline 100 bid for 10 days

## 2011-05-01 NOTE — Telephone Encounter (Addendum)
Pt started Augmentin from Dr. Lovell Sheehan, and is now having chills, nausea and , and wants to know what to do about this.

## 2011-05-23 ENCOUNTER — Encounter: Payer: Self-pay | Admitting: Internal Medicine

## 2011-06-13 ENCOUNTER — Other Ambulatory Visit: Payer: Self-pay | Admitting: *Deleted

## 2011-06-13 MED ORDER — PHENTERMINE HCL 37.5 MG PO CAPS: 37.5000 mg | ORAL_CAPSULE | ORAL | Status: DC

## 2011-07-07 ENCOUNTER — Other Ambulatory Visit: Payer: Self-pay | Admitting: Internal Medicine

## 2011-08-06 ENCOUNTER — Ambulatory Visit: Payer: BC Managed Care – PPO | Admitting: Internal Medicine

## 2011-08-07 ENCOUNTER — Ambulatory Visit (INDEPENDENT_AMBULATORY_CARE_PROVIDER_SITE_OTHER): Payer: BC Managed Care – PPO | Admitting: Internal Medicine

## 2011-08-07 ENCOUNTER — Encounter: Payer: Self-pay | Admitting: Internal Medicine

## 2011-08-07 VITALS — BP 130/80 | HR 72 | Temp 98.3°F | Resp 16 | Ht 64.0 in | Wt 174.0 lb

## 2011-08-07 DIAGNOSIS — I10 Essential (primary) hypertension: Secondary | ICD-10-CM

## 2011-08-07 DIAGNOSIS — R635 Abnormal weight gain: Secondary | ICD-10-CM

## 2011-08-07 DIAGNOSIS — M722 Plantar fascial fibromatosis: Secondary | ICD-10-CM

## 2011-08-07 MED ORDER — PHENTERMINE HCL 37.5 MG PO CAPS: 37.5000 mg | ORAL_CAPSULE | ORAL | Status: DC

## 2011-08-07 MED ORDER — TOPIRAMATE 25 MG PO TABS: 25.0000 mg | ORAL_TABLET | Freq: Two times a day (BID) | ORAL | Status: DC

## 2011-08-07 NOTE — Patient Instructions (Signed)
The Topamax makes you sun sensitive You have summer allergies so start with one Claritin a day and if need increase to twice a day

## 2011-08-07 NOTE — Progress Notes (Signed)
Subjective:    Patient ID: Laura Green, female    DOB: 03/12/1955, 56 y.o.   MRN: [id number]  HPI  Has lost 40 pounds Blood pressure is stable and the pt stopped blood pressure medications ( ran out) Acute pain in left back for one week that did not resolve with heat and ice. The pain increased with walking. Sharp pain. No blood or fowl smelling urine.  Has "blotchy skin" and noted itching of skin   Review of Systems  Constitutional: Negative for activity change, appetite change and fatigue.  HENT: Negative for ear pain, congestion, neck pain, postnasal drip and sinus pressure.   Eyes: Negative for redness and visual disturbance.  Respiratory: Negative for cough, shortness of breath and wheezing.   Gastrointestinal: Negative for abdominal pain and abdominal distention.  Genitourinary: Negative for dysuria, frequency and menstrual problem.  Musculoskeletal: Negative for myalgias, joint swelling and arthralgias.  Skin: Negative for rash and wound.  Neurological: Negative for dizziness, weakness and headaches.  Hematological: Negative for adenopathy. Does not bruise/bleed easily.  Psychiatric/Behavioral: Negative for sleep disturbance and decreased concentration.   Past Medical History  Diagnosis Date  . Allergy   . Depression   . Hypertension   . Hyperlipidemia   . Concussion with no loss of consciousness 04/01/2008    History   Social History  . Marital Status: Married    Spouse Name: N/A    Number of Children: N/A  . Years of Education: N/A   Occupational History  . Not on file.   Social History Main Topics  . Smoking status: Never Smoker   . Smokeless tobacco: Not on file  . Alcohol Use: No  . Drug Use: No  . Sexually Active: Yes   Other Topics Concern  . Not on file   Social History Narrative  . No narrative on file    Past Surgical History  Procedure Date  . Cesarean section   . Rotator cuff repair     Family History  Problem Relation Age of  Onset  . Arthritis Mother   . COPD Father   . Hyperlipidemia Father     Allergies  Allergen Reactions  . Augmentin (Amoxicillin-Pot Clavulanate) Rash    Current Outpatient Prescriptions on File Prior to Visit  Medication Sig Dispense Refill  . albuterol (PROVENTIL HFA) 108 (90 BASE) MCG/ACT inhaler Inhale 2 puffs into the lungs every 4 (four) hours as needed.        Marland Kitchen augmented betamethasone dipropionate (DIPROLENE-AF) 0.05 % cream Apply topically 2 (two) times daily.        . beclomethasone (QVAR) 80 MCG/ACT inhaler Inhale 1 puff into the lungs 2 (two) times daily.        . Calcium Carbonate-Vitamin D (CALTRATE 600+D) 600-400 MG-UNIT per chew tablet Chew 1 tablet by mouth daily.        . mometasone (NASONEX) 50 MCG/ACT nasal spray 1 spray by Nasal route 2 (two) times daily.        . Multiple Vitamin (MULTIVITAMIN) capsule Take 1 capsule by mouth daily.        Marland Kitchen topiramate (TOPAMAX) 25 MG tablet TAKE 1 TABLET BY MOUTH TWICE A DAY  60 tablet  6  . vitamin E 100 UNIT capsule Take 100 Units by mouth daily.        Marland Kitchen DISCONTD: topiramate (TOPAMAX) 25 MG tablet Take 1 tablet (25 mg total) by mouth 2 (two) times daily.  60 tablet  6    BP  130/80  Pulse 72  Temp 98.3 F (36.8 C)  Resp 16  Ht 5\' 4"  (1.626 m)  Wt 174 lb (78.926 kg)  BMI 29.87 kg/m2       Objective:   Physical Exam  Nursing note and vitals reviewed. Constitutional: She is oriented to person, place, and time. She appears well-developed and well-nourished. No distress.  HENT:  Head: Normocephalic and atraumatic.  Right Ear: External ear normal.  Left Ear: External ear normal.  Nose: Nose normal.  Mouth/Throat: Oropharynx is clear and moist.  Eyes: Conjunctivae and EOM are normal. Pupils are equal, round, and reactive to light.  Neck: Normal range of motion. Neck supple. No JVD present. No tracheal deviation present. No thyromegaly present.  Cardiovascular: Normal rate, regular rhythm, normal heart sounds and intact  distal pulses.   No murmur heard. Pulmonary/Chest: Effort normal and breath sounds normal. She has no wheezes. She exhibits no tenderness.  Abdominal: Soft. Bowel sounds are normal.  Musculoskeletal: Normal range of motion. She exhibits no edema and no tenderness.  Lymphadenopathy:    She has no cervical adenopathy.  Neurological: She is alert and oriented to person, place, and time. She has normal reflexes. No cranial nerve deficit.  Skin: Skin is warm and dry. She is not diaphoretic.  Psychiatric: She has a normal mood and affect. Her behavior is normal.          Assessment & Plan:  Resolved HTN with weight loss and exercize Hx of allergies Asthma stable Refill weight management medications

## 2011-08-07 NOTE — Assessment & Plan Note (Signed)
With weight loss the patient has resolved her hypertension

## 2011-09-20 ENCOUNTER — Other Ambulatory Visit: Payer: Self-pay | Admitting: Internal Medicine

## 2011-09-20 MED ORDER — PHENTERMINE HCL 37.5 MG PO CAPS: 37.5000 mg | ORAL_CAPSULE | ORAL | Status: DC

## 2011-09-20 NOTE — Telephone Encounter (Signed)
Caller is pt. ( cell phone # [phone]).  States in June 2013, she saw Dr. Lovell Sheehan and requested her script for Phentermine 37.5mg  - 1 capsule po q am be phoned or  faxed to [facility] at  [facility]. , ph# [phone] because drug is cheaper at this pharmacy.  When patient went to pick up med at pharmacy yest. ( 07/24), script was not available.  [facility] has since sent a faxed request for med but has not heard back from clinic at this time. Caller is completely out of med. Advised caller will forward med request to Dr. Lovell Sheehan. Caller verb. Understanding and agreement.

## 2011-10-31 ENCOUNTER — Ambulatory Visit (INDEPENDENT_AMBULATORY_CARE_PROVIDER_SITE_OTHER): Payer: BC Managed Care – PPO | Admitting: Family Medicine

## 2011-10-31 ENCOUNTER — Encounter: Payer: Self-pay | Admitting: Family Medicine

## 2011-10-31 VITALS — BP 140/80 | Temp 98.0°F | Wt 177.0 lb

## 2011-10-31 DIAGNOSIS — J310 Chronic rhinitis: Secondary | ICD-10-CM

## 2011-10-31 MED ORDER — METHYLPREDNISOLONE ACETATE 80 MG/ML IJ SUSP
80.0000 mg | Freq: Once | INTRAMUSCULAR | Status: AC
  Administered 2011-10-31: 80 mg via INTRAMUSCULAR

## 2011-10-31 MED ORDER — MOMETASONE FUROATE 50 MCG/ACT NA SUSP: 2.0000 | Freq: Every day | NASAL | Status: DC

## 2011-10-31 NOTE — Patient Instructions (Addendum)
Follow up for any fever or if symptoms not improving over the next few days.

## 2011-10-31 NOTE — Progress Notes (Signed)
  Subjective:    Patient ID: Laura Green, female    DOB: [birth year], 56 y.o.   MRN: [id number]  HPI  Patient seen with increasing nasal congestion over the past several days. Yesterday she had frequent sneezing. No fever.  History of allergic rhinitis. She takes Zyrtec regularly. Just started yesterday with Nasonex. She's had some bilateral facial pressure. No significant cough. No headaches   Review of Systems  Constitutional: Negative for fever and chills.  HENT: Positive for congestion, sneezing, postnasal drip and sinus pressure.   Respiratory: Negative for cough.   Neurological: Negative for headaches.       Objective:   Physical Exam  Constitutional: She appears well-developed and well-nourished.  HENT:  Right Ear: External ear normal.  Left Ear: External ear normal.  Mouth/Throat: Oropharynx is clear and moist.       Pale nasal mucosa. No purulent secretions  Neck: Neck supple.  Cardiovascular: Normal rate and regular rhythm.   Pulmonary/Chest: Effort normal and breath sounds normal. No respiratory distress. She has no wheezes. She has no rales.          Assessment & Plan:  Rhinitis. Suspect allergic. Continue Zyrtec. Further samples of Nasonex and refills given. Patient has acute exacerbation and Depo-Medrol 80 mg given. Followup promptly for any signs of secondary infection

## 2011-11-02 ENCOUNTER — Other Ambulatory Visit: Payer: Self-pay | Admitting: *Deleted

## 2011-11-02 MED ORDER — MOMETASONE FUROATE 50 MCG/ACT NA SUSP: 2.0000 | Freq: Every day | NASAL | Status: DC

## 2011-11-08 ENCOUNTER — Telehealth: Payer: Self-pay | Admitting: Family Medicine

## 2011-11-08 MED ORDER — FLUTICASONE PROPIONATE 50 MCG/ACT NA SUSP: 2.0000 | Freq: Every day | NASAL | Status: DC

## 2011-11-08 NOTE — Telephone Encounter (Signed)
Patient's Prior Auth on Nasonex was denied. Per insurance: pt must try generic fluticasone, generic flunisolide, or generic triamcinolone acetonide nasal spray. The only attempted nasal sprays within chart are Nasonex and Patanase.  Please advise.

## 2011-11-08 NOTE — Telephone Encounter (Signed)
Per dr Lovell Sheehan - may have fluticasone-pt informed and med sent in

## 2011-11-12 ENCOUNTER — Ambulatory Visit: Payer: BC Managed Care – PPO | Admitting: Internal Medicine

## 2011-11-13 ENCOUNTER — Ambulatory Visit: Payer: BC Managed Care – PPO | Admitting: Internal Medicine

## 2011-11-14 ENCOUNTER — Other Ambulatory Visit: Payer: Self-pay | Admitting: *Deleted

## 2011-11-14 MED ORDER — PHENTERMINE HCL 37.5 MG PO CAPS: 37.5000 mg | ORAL_CAPSULE | ORAL | Status: DC

## 2011-12-10 ENCOUNTER — Ambulatory Visit (INDEPENDENT_AMBULATORY_CARE_PROVIDER_SITE_OTHER): Payer: BC Managed Care – PPO | Admitting: Internal Medicine

## 2011-12-10 VITALS — BP 136/80 | HR 72 | Temp 98.6°F | Resp 16 | Ht 64.0 in | Wt 174.0 lb

## 2011-12-10 DIAGNOSIS — R635 Abnormal weight gain: Secondary | ICD-10-CM

## 2011-12-10 DIAGNOSIS — M159 Polyosteoarthritis, unspecified: Secondary | ICD-10-CM

## 2011-12-10 DIAGNOSIS — J301 Allergic rhinitis due to pollen: Secondary | ICD-10-CM

## 2011-12-10 DIAGNOSIS — E669 Obesity, unspecified: Secondary | ICD-10-CM

## 2011-12-10 DIAGNOSIS — R634 Abnormal weight loss: Secondary | ICD-10-CM

## 2011-12-10 DIAGNOSIS — Z9109 Other allergy status, other than to drugs and biological substances: Secondary | ICD-10-CM

## 2011-12-10 MED ORDER — TOPIRAMATE 25 MG PO TABS: 25.0000 mg | ORAL_TABLET | Freq: Two times a day (BID) | ORAL | Status: DC

## 2011-12-10 MED ORDER — PHENTERMINE HCL 37.5 MG PO CAPS: 37.5000 mg | ORAL_CAPSULE | ORAL | Status: DC

## 2011-12-10 MED ORDER — PHENTERMINE HCL 30 MG PO CAPS: 30.0000 mg | ORAL_CAPSULE | ORAL | Status: DC

## 2011-12-10 NOTE — Progress Notes (Signed)
  Subjective:    Patient ID: Laura Green, female    DOB: February 07, 1956, 56 y.o.   MRN: [id number]  HPI  Follow up weight loss.. Planned Doing zumba and has noted increased body pain  Review of Systems  Constitutional: Negative for activity change, appetite change and fatigue.  HENT: Negative for ear pain, congestion, neck pain, postnasal drip and sinus pressure.   Eyes: Negative for redness and visual disturbance.  Respiratory: Negative for cough, shortness of breath and wheezing.   Gastrointestinal: Negative for abdominal pain and abdominal distention.  Genitourinary: Negative for dysuria, frequency and menstrual problem.  Musculoskeletal: Negative for myalgias, joint swelling and arthralgias.  Skin: Negative for rash and wound.  Neurological: Negative for dizziness, weakness and headaches.  Hematological: Negative for adenopathy. Does not bruise/bleed easily.  Psychiatric/Behavioral: Negative for disturbed wake/sleep cycle and decreased concentration.       Objective:   Physical Exam  Nursing note and vitals reviewed. Constitutional: She is oriented to person, place, and time. She appears well-developed and well-nourished. No distress.  HENT:  Head: Normocephalic and atraumatic.  Right Ear: External ear normal.  Left Ear: External ear normal.  Nose: Nose normal.  Mouth/Throat: Oropharynx is clear and moist.  Eyes: Conjunctivae normal and EOM are normal. Pupils are equal, round, and reactive to light.  Neck: Normal range of motion. Neck supple. No JVD present. No tracheal deviation present. No thyromegaly present.  Cardiovascular: Normal rate, regular rhythm, normal heart sounds and intact distal pulses.   No murmur heard. Pulmonary/Chest: Effort normal and breath sounds normal. She has no wheezes. She exhibits no tenderness.  Abdominal: Soft. Bowel sounds are normal.  Musculoskeletal: Normal range of motion. She exhibits no edema and no tenderness.  Lymphadenopathy:   She has no cervical adenopathy.  Neurological: She is alert and oriented to person, place, and time. She has normal reflexes. No cranial nerve deficit.  Skin: Skin is warm and dry. She is not diaphoretic.  Psychiatric: She has a normal mood and affect. Her behavior is normal.          Assessment & Plan:  Discussed diet and inflamaton Suggested gluten free diet zumba may be "hard on her joints" Discussed mixed exercises such as yoga with stretching  Trial of celebrex for inflamation

## 2012-01-14 ENCOUNTER — Other Ambulatory Visit: Payer: Self-pay | Admitting: *Deleted

## 2012-02-25 ENCOUNTER — Encounter: Payer: Self-pay | Admitting: Family

## 2012-02-25 ENCOUNTER — Ambulatory Visit (INDEPENDENT_AMBULATORY_CARE_PROVIDER_SITE_OTHER): Payer: BC Managed Care – PPO | Admitting: Family

## 2012-02-25 VITALS — BP 140/80 | HR 86 | Temp 98.2°F | Wt 174.0 lb

## 2012-02-25 DIAGNOSIS — J209 Acute bronchitis, unspecified: Secondary | ICD-10-CM

## 2012-02-25 DIAGNOSIS — R059 Cough, unspecified: Secondary | ICD-10-CM

## 2012-02-25 DIAGNOSIS — R05 Cough: Secondary | ICD-10-CM

## 2012-02-25 MED ORDER — PREDNISONE 20 MG PO TABS: ORAL_TABLET | ORAL | Status: DC

## 2012-02-25 MED ORDER — AZITHROMYCIN 250 MG PO TABS: ORAL_TABLET | ORAL | Status: DC

## 2012-02-25 NOTE — Patient Instructions (Addendum)

## 2012-02-25 NOTE — Progress Notes (Signed)
Subjective:    Patient ID: Laura Green, female    DOB: [birth year], 56 y.o.   MRN: [id number]  HPI 56 year old female, nonsmoker, is in today with cough, congestion, sore throat, headache x 6 days and worsening. Has been taking Mucinex with no relief.    Review of Systems  Constitutional: Negative.   Respiratory: Positive for cough and wheezing.   Cardiovascular: Negative.   Skin: Negative.   Neurological: Negative.   Hematological: Negative.   Psychiatric/Behavioral: Negative.    Past Medical History  Diagnosis Date  . Allergy   . Depression   . Hypertension   . Hyperlipidemia   . Concussion with no loss of consciousness 04/01/2008    History   Social History  . Marital Status: Married    Spouse Name: N/A    Number of Children: N/A  . Years of Education: N/A   Occupational History  . Not on file.   Social History Main Topics  . Smoking status: Never Smoker   . Smokeless tobacco: Not on file  . Alcohol Use: No  . Drug Use: No  . Sexually Active: Yes   Other Topics Concern  . Not on file   Social History Narrative  . No narrative on file    Past Surgical History  Procedure Date  . Cesarean section   . Rotator cuff repair     Family History  Problem Relation Age of Onset  . Arthritis Mother   . COPD Father   . Hyperlipidemia Father     Allergies  Allergen Reactions  . Augmentin (Amoxicillin-Pot Clavulanate) Rash    Current Outpatient Prescriptions on File Prior to Visit  Medication Sig Dispense Refill  . albuterol (PROVENTIL HFA) 108 (90 BASE) MCG/ACT inhaler Inhale 2 puffs into the lungs every 4 (four) hours as needed.        Marland Kitchen augmented betamethasone dipropionate (DIPROLENE-AF) 0.05 % cream Apply topically 2 (two) times daily.        . beclomethasone (QVAR) 80 MCG/ACT inhaler Inhale 1 puff into the lungs 2 (two) times daily.        . Calcium Carbonate-Vitamin D (CALTRATE 600+D) 600-400 MG-UNIT per chew tablet Chew 1 tablet by mouth daily.         . fluticasone (FLONASE) 50 MCG/ACT nasal spray Place 2 sprays into the nose daily.  16 g  6  . Multiple Vitamin (MULTIVITAMIN) capsule Take 1 capsule by mouth daily.        . phentermine 30 MG capsule Take 1 capsule (30 mg total) by mouth every morning.  30 capsule  2  . topiramate (TOPAMAX) 25 MG tablet Take 1 tablet (25 mg total) by mouth 2 (two) times daily.  60 tablet  6  . vitamin E 100 UNIT capsule Take 100 Units by mouth daily.          BP 140/80  Pulse 86  Temp 98.2 F (36.8 C) (Oral)  Wt 174 lb (78.926 kg)  SpO2 97%chart     Objective:   Physical Exam  Constitutional: She is oriented to person, place, and time. She appears well-developed and well-nourished.  HENT:  Right Ear: External ear normal.  Left Ear: External ear normal.  Mouth/Throat: Oropharynx is clear and moist.  Neck: Normal range of motion. Neck supple.  Cardiovascular: Normal rate, regular rhythm and normal heart sounds.   Pulmonary/Chest: Effort normal. She has wheezes. She has rales.  Neurological: She is oriented to person, place, and time.  Skin: Skin  is warm and dry.          Assessment & Plan:  Assessment: Bronchitis, Mycoplasma pneumonia-probable  Plan: Medrol DP as directed. Zpak as directed. Call the office if symptoms worsen or persist. Recheck as scheduled and as needed.

## 2012-02-26 ENCOUNTER — Telehealth: Payer: Self-pay | Admitting: Internal Medicine

## 2012-02-26 NOTE — Telephone Encounter (Signed)
Pt is requesting a work note from 12-30 thru 02-27-2011. Pt will return to work on 02-28-2011 dx bronchitis

## 2012-02-26 NOTE — Telephone Encounter (Signed)
Note printed and pt aware. Note to be picked up by pt's husband, Augstin Luu at [employer] today

## 2012-03-10 ENCOUNTER — Encounter: Payer: Self-pay | Admitting: Internal Medicine

## 2012-03-10 NOTE — Patient Instructions (Signed)
The patient is instructed to continue all medications as prescribed. Schedule followup with check out clerk upon leaving the clinic  

## 2012-03-17 ENCOUNTER — Ambulatory Visit: Payer: BC Managed Care – PPO | Admitting: Internal Medicine

## 2012-04-07 ENCOUNTER — Encounter: Payer: Self-pay | Admitting: Family Medicine

## 2012-04-07 ENCOUNTER — Ambulatory Visit (INDEPENDENT_AMBULATORY_CARE_PROVIDER_SITE_OTHER): Payer: BC Managed Care – PPO | Admitting: Family Medicine

## 2012-04-07 VITALS — BP 142/80 | HR 85 | Temp 97.4°F | Wt 180.0 lb

## 2012-04-07 DIAGNOSIS — J019 Acute sinusitis, unspecified: Secondary | ICD-10-CM

## 2012-04-07 MED ORDER — BECLOMETHASONE DIPROPIONATE 80 MCG/ACT IN AERS: 2.0000 | INHALATION_SPRAY | Freq: Two times a day (BID) | RESPIRATORY_TRACT | Status: DC

## 2012-04-07 MED ORDER — CEFUROXIME AXETIL 500 MG PO TABS: 500.0000 mg | ORAL_TABLET | Freq: Two times a day (BID) | ORAL | Status: DC

## 2012-04-07 NOTE — Progress Notes (Signed)
  Subjective:    Patient ID: Laura Green, female    DOB: 09-12-1955, 57 y.o.   MRN: [id number]  HPI Here for one week of sinus pressure, itchy eyes, PND, and a dry cough. No fever.    Review of Systems  Constitutional: Negative.   HENT: Positive for congestion, postnasal drip and sinus pressure.   Eyes: Negative.   Respiratory: Positive for cough.        Objective:   Physical Exam  Constitutional: She appears well-developed and well-nourished.  HENT:  Right Ear: External ear normal.  Left Ear: External ear normal.  Nose: Nose normal.  Mouth/Throat: Oropharynx is clear and moist.  Eyes: Conjunctivae are normal.  Pulmonary/Chest: Effort normal and breath sounds normal.  Lymphadenopathy:    She has no cervical adenopathy.          Assessment & Plan:  Treat with Ceftin. Refilled Qvar. Use Artificial Tears prn

## 2012-04-15 ENCOUNTER — Other Ambulatory Visit: Payer: Self-pay | Admitting: *Deleted

## 2012-04-15 DIAGNOSIS — E669 Obesity, unspecified: Secondary | ICD-10-CM

## 2012-04-15 MED ORDER — PHENTERMINE HCL 30 MG PO CAPS: 30.0000 mg | ORAL_CAPSULE | ORAL | Status: DC

## 2012-05-30 ENCOUNTER — Ambulatory Visit (INDEPENDENT_AMBULATORY_CARE_PROVIDER_SITE_OTHER): Payer: BC Managed Care – PPO | Admitting: Internal Medicine

## 2012-05-30 ENCOUNTER — Encounter: Payer: Self-pay | Admitting: Internal Medicine

## 2012-05-30 VITALS — BP 132/80 | HR 72 | Temp 98.0°F | Resp 16 | Ht 64.0 in | Wt 177.0 lb

## 2012-05-30 DIAGNOSIS — J45909 Unspecified asthma, uncomplicated: Secondary | ICD-10-CM

## 2012-05-30 DIAGNOSIS — E669 Obesity, unspecified: Secondary | ICD-10-CM

## 2012-05-30 NOTE — Patient Instructions (Addendum)
Back Exercises Back exercises help treat and prevent back injuries. The goal of back exercises is to increase the strength of your abdominal and back muscles and the flexibility of your back. These exercises should be started when you no longer have back pain. Back exercises include:  Pelvic Tilt. Lie on your back with your knees bent. Tilt your pelvis until the lower part of your back is against the floor. Hold this position 5 to 10 sec and repeat 5 to 10 times.  Knee to Chest. Pull first 1 knee up against your chest and hold for 20 to 30 seconds, repeat this with the other knee, and then both knees. This may be done with the other leg straight or bent, whichever feels better.  Sit-Ups or Curl-Ups. Bend your knees 90 degrees. Start with tilting your pelvis, and do a partial, slow sit-up, lifting your trunk only 30 to 45 degrees off the floor. Take at least 2 to 3 seconds for each sit-up. Do not do sit-ups with your knees out straight. If partial sit-ups are difficult, simply do the above but with only tightening your abdominal muscles and holding it as directed.  Hip-Lift. Lie on your back with your knees flexed 90 degrees. Push down with your feet and shoulders as you raise your hips a couple inches off the floor; hold for 10 seconds, repeat 5 to 10 times.  Back arches. Lie on your stomach, propping yourself up on bent elbows. Slowly press on your hands, causing an arch in your low back. Repeat 3 to 5 times. Any initial stiffness and discomfort should lessen with repetition over time.  Shoulder-Lifts. Lie face down with arms beside your body. Keep hips and torso pressed to floor as you slowly lift your head and shoulders off the floor. Do not overdo your exercises, especially in the beginning. Exercises may cause you some mild back discomfort which lasts for a few minutes; however, if the pain is more severe, or lasts for more than 15 minutes, do not continue exercises until you see your caregiver.  Improvement with exercise therapy for back problems is slow.  See your caregivers for assistance with developing a proper back exercise program. Document Released: 03/22/2004 Document Revised: 05/07/2011 Document Reviewed: 12/14/2010 [employer] Patient Information 2013 [employer].  

## 2012-05-30 NOTE — Progress Notes (Signed)
Subjective:    Patient ID: Laura Green, female    DOB: 19-Mar-1955, 57 y.o.   MRN: [id number]  HPI Increased asthma and need for inhaler due to allergies Has been out of the qvar twice a day Has been out of these    Review of Systems  Constitutional: Negative for activity change, appetite change and fatigue.  HENT: Negative for ear pain, congestion, neck pain, postnasal drip and sinus pressure.   Eyes: Negative for redness and visual disturbance.  Respiratory: Negative for cough, shortness of breath and wheezing.   Gastrointestinal: Negative for abdominal pain and abdominal distention.  Genitourinary: Negative for dysuria, frequency and menstrual problem.  Musculoskeletal: Negative for myalgias, joint swelling and arthralgias.  Skin: Negative for rash and wound.  Neurological: Negative for dizziness, weakness and headaches.  Hematological: Negative for adenopathy. Does not bruise/bleed easily.  Psychiatric/Behavioral: Negative for sleep disturbance and decreased concentration.   Past Medical History  Diagnosis Date  . Allergy   . Depression   . Hypertension   . Hyperlipidemia   . Concussion with no loss of consciousness 04/01/2008    History   Social History  . Marital Status: Married    Spouse Name: N/A    Number of Children: N/A  . Years of Education: N/A   Occupational History  . Not on file.   Social History Main Topics  . Smoking status: Never Smoker   . Smokeless tobacco: Never Used  . Alcohol Use: No  . Drug Use: No  . Sexually Active: Yes   Other Topics Concern  . Not on file   Social History Narrative  . No narrative on file    Past Surgical History  Procedure Laterality Date  . Cesarean section    . Rotator cuff repair      Family History  Problem Relation Age of Onset  . Arthritis Mother   . COPD Father   . Hyperlipidemia Father     Allergies  Allergen Reactions  . Augmentin (Amoxicillin-Pot Clavulanate) Rash    Current  Outpatient Prescriptions on File Prior to Visit  Medication Sig Dispense Refill  . albuterol (PROVENTIL HFA) 108 (90 BASE) MCG/ACT inhaler Inhale 2 puffs into the lungs every 4 (four) hours as needed.        Marland Kitchen augmented betamethasone dipropionate (DIPROLENE-AF) 0.05 % cream Apply topically 2 (two) times daily.        . beclomethasone (QVAR) 80 MCG/ACT inhaler Inhale 2 puffs into the lungs 2 (two) times daily.  1 Inhaler  2  . Calcium Carbonate-Vitamin D (CALTRATE 600+D) 600-400 MG-UNIT per chew tablet Chew 1 tablet by mouth daily.        . fluticasone (FLONASE) 50 MCG/ACT nasal spray Place 2 sprays into the nose daily.  16 g  6  . Multiple Vitamin (MULTIVITAMIN) capsule Take 1 capsule by mouth daily.        . phentermine 30 MG capsule Take 1 capsule (30 mg total) by mouth every morning.  30 capsule  3  . topiramate (TOPAMAX) 25 MG tablet Take 1 tablet (25 mg total) by mouth 2 (two) times daily.  60 tablet  6  . vitamin E 100 UNIT capsule Take 100 Units by mouth daily.         No current facility-administered medications on file prior to visit.    BP 132/80  Pulse 72  Temp(Src) 98 F (36.7 C)  Resp 16  Ht 5\' 4"  (1.626 m)  Wt 177 lb (80.287  kg)  BMI 30.37 kg/m2       Objective:   Physical Exam  Nursing note and vitals reviewed. Constitutional: She appears well-nourished.  HENT:  Head: Normocephalic and atraumatic.  Eyes: Conjunctivae are normal.  Neck: Normal range of motion. Neck supple.  Cardiovascular: Normal rate and regular rhythm.   Pulmonary/Chest: Effort normal. She has wheezes.  Abdominal: Soft. Bowel sounds are normal.  Skin: Skin is warm and dry.  Psychiatric: She has a normal mood and affect. Her behavior is normal.          Assessment & Plan:  Asthma  to resume medications for maintenance inhalers need to be resumed Will give samples of Qvar and rescue inhaler Proventil before she has her exercise class

## 2012-06-19 ENCOUNTER — Encounter: Payer: Self-pay | Admitting: Family Medicine

## 2012-06-19 ENCOUNTER — Ambulatory Visit (INDEPENDENT_AMBULATORY_CARE_PROVIDER_SITE_OTHER): Payer: BC Managed Care – PPO | Admitting: Family Medicine

## 2012-06-19 VITALS — BP 120/80 | Temp 99.0°F | Wt 181.0 lb

## 2012-06-19 DIAGNOSIS — M954 Acquired deformity of chest and rib: Secondary | ICD-10-CM

## 2012-06-19 NOTE — Progress Notes (Signed)
No chief complaint on file.   HPI:  57 yo F pt of Dr. Lovell Sheehan here for acute visit for change in tissue appearance between breast: -just saw her PCP earlier this month for asthma and allergy issues but she forgot to mention this -first noticed about one month ago -husband thinks it looks the same -she has lost a lot of weight and thinks it is nothing - just had mammogram and tech told her looked fine but she wanted doctor to check it -denies: lump, skin changes, and breast lumps or changed, pain -has lost a lot of weight intentional lately with diet and exercise   ROS: See pertinent positives and negatives per HPI.  Past Medical History  Diagnosis Date  . Allergy   . Depression   . Hypertension   . Hyperlipidemia   . Concussion with no loss of consciousness 04/01/2008    Family History  Problem Relation Age of Onset  . Arthritis Mother   . COPD Father   . Hyperlipidemia Father     History   Social History  . Marital Status: Married    Spouse Name: N/A    Number of Children: N/A  . Years of Education: N/A   Social History Main Topics  . Smoking status: Never Smoker   . Smokeless tobacco: Never Used  . Alcohol Use: No  . Drug Use: No  . Sexually Active: Yes   Other Topics Concern  . None   Social History Narrative  . None    Current outpatient prescriptions:albuterol (PROVENTIL HFA) 108 (90 BASE) MCG/ACT inhaler, Inhale 2 puffs into the lungs every 4 (four) hours as needed.  , Disp: , Rfl: ;  augmented betamethasone dipropionate (DIPROLENE-AF) 0.05 % cream, Apply topically 2 (two) times daily.  , Disp: , Rfl: ;  beclomethasone (QVAR) 80 MCG/ACT inhaler, Inhale 2 puffs into the lungs 2 (two) times daily., Disp: 1 Inhaler, Rfl: 2 Calcium Carbonate-Vitamin D (CALTRATE 600+D) 600-400 MG-UNIT per chew tablet, Chew 1 tablet by mouth daily.  , Disp: , Rfl: ;  fluticasone (FLONASE) 50 MCG/ACT nasal spray, Place 2 sprays into the nose daily., Disp: 16 g, Rfl: 6;  Multiple  Vitamin (MULTIVITAMIN) capsule, Take 1 capsule by mouth daily.  , Disp: , Rfl: ;  phentermine 30 MG capsule, Take 1 capsule (30 mg total) by mouth every morning., Disp: 30 capsule, Rfl: 3 topiramate (TOPAMAX) 25 MG tablet, Take 1 tablet (25 mg total) by mouth 2 (two) times daily., Disp: 60 tablet, Rfl: 6;  vitamin E 100 UNIT capsule, Take 100 Units by mouth daily.  , Disp: , Rfl:   EXAM:  Filed Vitals:   06/19/12 1527  BP: 120/80  Temp: 99 F (37.2 C)    Body mass index is 31.05 kg/(m^2).  GENERAL: vitals reviewed and listed above, alert, oriented, appears well hydrated and in no acute distress  HEENT: atraumatic, conjunttiva clear, no obvious abnormalities on inspection of external nose and ears  NECK: no obvious masses on inspection  LUNGS: clear to auscultation bilaterally, no wheezes, rales or rhonchi, good air movement  CV: HRRR, no peripheral edema  BREAST: completely normal appearance and palpation of breasts and tissue between breasts - area she points to is simple the crease between her abdominal superficial soft tissue and fat and chest wall superficial tissues at the midline  MS: moves all extremities without noticeable abnormality  PSYCH: pleasant and cooperative, no obvious depression or anxiety  ASSESSMENT AND PLAN:  Discussed the following assessment  and plan:  Chest wall deformity  -appears normal and likely patient is noticing changes related to her recent loss of breast tissue mass from her diet and exercise and now can see thee tissue between her breasts. Offered [place] of this area if she is worried, but she was reassured and will follow up with PCP in a few months and recheck then. -Patient advised to return or notify a doctor immediately if symptoms worsen or persist or new concerns arise.  There are no Patient Instructions on file for this visit.   Kriste Basque R.

## 2012-06-29 ENCOUNTER — Other Ambulatory Visit: Payer: Self-pay | Admitting: Internal Medicine

## 2012-08-06 ENCOUNTER — Other Ambulatory Visit: Payer: Self-pay | Admitting: *Deleted

## 2012-08-06 DIAGNOSIS — E669 Obesity, unspecified: Secondary | ICD-10-CM

## 2012-08-06 MED ORDER — PHENTERMINE HCL 30 MG PO CAPS: 30.0000 mg | ORAL_CAPSULE | ORAL | Status: DC

## 2012-09-29 ENCOUNTER — Ambulatory Visit: Payer: BC Managed Care – PPO | Admitting: Internal Medicine

## 2012-10-01 ENCOUNTER — Ambulatory Visit (INDEPENDENT_AMBULATORY_CARE_PROVIDER_SITE_OTHER): Payer: 59 | Admitting: Internal Medicine

## 2012-10-01 ENCOUNTER — Encounter: Payer: Self-pay | Admitting: Internal Medicine

## 2012-10-01 VITALS — BP 130/80 | HR 76 | Temp 99.0°F | Resp 16 | Ht 64.0 in | Wt 182.0 lb

## 2012-10-01 DIAGNOSIS — M25579 Pain in unspecified ankle and joints of unspecified foot: Secondary | ICD-10-CM

## 2012-10-01 DIAGNOSIS — M7552 Bursitis of left shoulder: Secondary | ICD-10-CM

## 2012-10-01 DIAGNOSIS — R635 Abnormal weight gain: Secondary | ICD-10-CM

## 2012-10-01 DIAGNOSIS — M67919 Unspecified disorder of synovium and tendon, unspecified shoulder: Secondary | ICD-10-CM

## 2012-10-01 DIAGNOSIS — I83893 Varicose veins of bilateral lower extremities with other complications: Secondary | ICD-10-CM

## 2012-10-01 DIAGNOSIS — M719 Bursopathy, unspecified: Secondary | ICD-10-CM

## 2012-10-01 DIAGNOSIS — E669 Obesity, unspecified: Secondary | ICD-10-CM

## 2012-10-01 MED ORDER — PHENTERMINE HCL 30 MG PO CAPS: 30.0000 mg | ORAL_CAPSULE | ORAL | Status: DC

## 2012-10-01 MED ORDER — METHYLPREDNISOLONE ACETATE 40 MG/ML IJ SUSP: 40.0000 mg | Freq: Once | INTRAMUSCULAR | Status: DC

## 2012-10-01 MED ORDER — TOPIRAMATE 25 MG PO TABS: 25.0000 mg | ORAL_TABLET | Freq: Two times a day (BID) | ORAL | Status: DC

## 2012-10-01 NOTE — Progress Notes (Signed)
  Subjective:    Patient ID: Laura Green, female    DOB: [birth year], 57 y.o.   MRN: [id number]  HPI  Increased shoulder and muscle pain Cannot lie on the left shoulder Cannot raise the shoulder Foot pain   Review of Systems  Constitutional: Negative for activity change, appetite change and fatigue.  HENT: Negative for ear pain, congestion, neck pain, postnasal drip and sinus pressure.   Eyes: Negative for redness and visual disturbance.  Respiratory: Negative for cough, shortness of breath and wheezing.   Gastrointestinal: Negative for abdominal pain and abdominal distention.  Genitourinary: Negative for dysuria, frequency and menstrual problem.  Musculoskeletal: Negative for myalgias, joint swelling and arthralgias.  Skin: Negative for rash and wound.  Neurological: Negative for dizziness, weakness and headaches.  Hematological: Negative for adenopathy. Does not bruise/bleed easily.  Psychiatric/Behavioral: Negative for sleep disturbance and decreased concentration.       Objective:   Physical Exam  Constitutional: She is oriented to person, place, and time. She appears well-developed and well-nourished. No distress.  HENT:  Head: Normocephalic and atraumatic.  Eyes: Conjunctivae and EOM are normal. Pupils are equal, round, and reactive to light.  Neck: Normal range of motion. Neck supple. No JVD present. No tracheal deviation present. No thyromegaly present.  Cardiovascular: Normal rate and regular rhythm.   No murmur heard. Pulmonary/Chest: Effort normal and breath sounds normal. She has no wheezes. She exhibits no tenderness.  Abdominal: Soft. Bowel sounds are normal.  Musculoskeletal: Normal range of motion. She exhibits no edema and no tenderness.  Lymphadenopathy:    She has no cervical adenopathy.  Neurological: She is alert and oriented to person, place, and time. She has normal reflexes. No cranial nerve deficit.  Skin: Skin is warm and dry. She is not  diaphoretic.  Psychiatric: She has a normal mood and affect. Her behavior is normal.          Assessment & Plan:  Physical examination shows fairly stable rotator cuff the biceps tendinitis is evident she does have tenderness when we push the shoulder into the joint suggesting bursitis  Informed consent obtained and the patient's Left shoulder was prepped with betadine. Local anesthesia was obtained with topical spray. Then 40 mg of Depo-Medrol and 1/2 cc of lidocaine was injected into the joint space. The patient tolerated the procedure without complications. Post injection care discussed with patient.

## 2012-11-28 LAB — HM PAP SMEAR: HM Pap smear: NORMAL

## 2013-03-03 ENCOUNTER — Other Ambulatory Visit: Payer: Self-pay | Admitting: Family Medicine

## 2013-03-03 ENCOUNTER — Other Ambulatory Visit: Payer: Self-pay | Admitting: *Deleted

## 2013-03-03 MED ORDER — OSELTAMIVIR PHOSPHATE 75 MG PO CAPS: 75.0000 mg | ORAL_CAPSULE | Freq: Every day | ORAL | Status: DC

## 2013-03-03 NOTE — Progress Notes (Signed)
Husband here today with ILI. I rx'd prophylactic course of tamiflu for his wife today.

## 2013-03-06 ENCOUNTER — Ambulatory Visit: Payer: 59 | Admitting: Internal Medicine

## 2013-04-29 ENCOUNTER — Other Ambulatory Visit: Payer: Self-pay | Admitting: Internal Medicine

## 2013-05-18 ENCOUNTER — Ambulatory Visit (INDEPENDENT_AMBULATORY_CARE_PROVIDER_SITE_OTHER): Payer: 59 | Admitting: Internal Medicine

## 2013-05-18 ENCOUNTER — Encounter: Payer: Self-pay | Admitting: Internal Medicine

## 2013-05-18 VITALS — BP 140/88 | HR 91 | Temp 97.8°F | Ht 64.0 in | Wt 183.0 lb

## 2013-05-18 DIAGNOSIS — R5383 Other fatigue: Principal | ICD-10-CM

## 2013-05-18 DIAGNOSIS — R5381 Other malaise: Secondary | ICD-10-CM

## 2013-05-18 DIAGNOSIS — E669 Obesity, unspecified: Secondary | ICD-10-CM

## 2013-05-18 LAB — COMPREHENSIVE METABOLIC PANEL
ALBUMIN: 3.4 g/dL — AB (ref 3.5–5.2)
ALT: 18 U/L (ref 0–35)
AST: 16 U/L (ref 0–37)
Alkaline Phosphatase: 66 U/L (ref 39–117)
BUN: 15 mg/dL (ref 6–23)
CALCIUM: 9.2 mg/dL (ref 8.4–10.5)
CHLORIDE: 107 meq/L (ref 96–112)
CO2: 29 meq/L (ref 19–32)
Creatinine, Ser: 0.7 mg/dL (ref 0.4–1.2)
GFR: 99.33 mL/min (ref >60.00)
GLUCOSE: 86 mg/dL (ref 70–99)
POTASSIUM: 4.2 meq/L (ref 3.5–5.1)
SODIUM: 141 meq/L (ref 135–145)
TOTAL PROTEIN: 7.1 g/dL (ref 6.0–8.3)
Total Bilirubin: 0.6 mg/dL (ref 0.3–1.2)

## 2013-05-18 LAB — CBC WITH DIFFERENTIAL/PLATELET
Basophils Absolute: 0 10*3/uL (ref 0.0–0.1)
Basophils Relative: 0.5 % (ref 0.0–3.0)
EOS ABS: 0.2 10*3/uL (ref 0.0–0.7)
Eosinophils Relative: 4 % (ref 0.0–5.0)
HCT: 38.5 % (ref 36.0–46.0)
Hemoglobin: 12.6 g/dL (ref 12.0–15.0)
Lymphocytes Relative: 32.1 % (ref 12.0–46.0)
Lymphs Abs: 2 10*3/uL (ref 0.7–4.0)
MCHC: 32.8 g/dL (ref 30.0–36.0)
MCV: 89 fl (ref 78.0–100.0)
MONO ABS: 0.6 10*3/uL (ref 0.1–1.0)
Monocytes Relative: 10 % (ref 3.0–12.0)
NEUTROS PCT: 53.4 % (ref 43.0–77.0)
Neutro Abs: 3.3 10*3/uL (ref 1.4–7.7)
PLATELETS: 260 10*3/uL (ref 150.0–400.0)
RBC: 4.32 Mil/uL (ref 3.87–5.11)
RDW: 13.1 % (ref 11.5–14.6)
WBC: 6.1 10*3/uL (ref 4.5–10.5)

## 2013-05-18 LAB — TSH: TSH: 1.23 u[IU]/mL (ref 0.35–5.50)

## 2013-05-18 MED ORDER — FLUTICASONE PROPIONATE 50 MCG/ACT NA SUSP: 2.0000 | Freq: Every day | NASAL | Status: DC

## 2013-05-18 MED ORDER — LORCASERIN HCL 10 MG PO TABS
1.0000 | ORAL_TABLET | Freq: Two times a day (BID) | ORAL | Status: DC
Start: 2013-05-18 — End: 2013-06-05

## 2013-05-18 NOTE — Progress Notes (Signed)
Subjective:    Patient ID: Laura Green, female    DOB: 1955/06/25, 58 y.o.   MRN: [id number]  HPI  Fatigue and weight management Weight stable but has not lost weight Mild HS asthma symptoms No maintenance drugs    Review of Systems  Constitutional: Negative for activity change, appetite change and fatigue.  HENT: Negative for congestion, ear pain, postnasal drip and sinus pressure.   Eyes: Negative for redness and visual disturbance.  Respiratory: Negative for cough, shortness of breath and wheezing.   Gastrointestinal: Negative for abdominal pain and abdominal distention.  Genitourinary: Negative for dysuria, frequency and menstrual problem.  Musculoskeletal: Negative for arthralgias, joint swelling, myalgias and neck pain.  Skin: Negative for rash and wound.  Neurological: Negative for dizziness, weakness and headaches.  Hematological: Negative for adenopathy. Does not bruise/bleed easily.  Psychiatric/Behavioral: Negative for sleep disturbance and decreased concentration.   / Past Medical History  Diagnosis Date  . Allergy   . Depression   . Hypertension   . Hyperlipidemia   . Concussion with no loss of consciousness 04/01/2008    History   Social History  . Marital Status: Married    Spouse Name: N/A    Number of Children: N/A  . Years of Education: N/A   Occupational History  . Not on file.   Social History Main Topics  . Smoking status: Never Smoker   . Smokeless tobacco: Never Used  . Alcohol Use: No  . Drug Use: No  . Sexual Activity: Yes   Other Topics Concern  . Not on file   Social History Narrative  . No narrative on file    Past Surgical History  Procedure Laterality Date  . Cesarean section    . Rotator cuff repair      Family History  Problem Relation Age of Onset  . Arthritis Mother   . COPD Father   . Hyperlipidemia Father     Allergies  Allergen Reactions  . Augmentin [Amoxicillin-Pot Clavulanate] Rash    Current  Outpatient Prescriptions on File Prior to Visit  Medication Sig Dispense Refill  . albuterol (PROVENTIL HFA) 108 (90 BASE) MCG/ACT inhaler Inhale 2 puffs into the lungs every 4 (four) hours as needed.        Marland Kitchen augmented betamethasone dipropionate (DIPROLENE-AF) 0.05 % cream Apply topically 2 (two) times daily.        . beclomethasone (QVAR) 80 MCG/ACT inhaler Inhale 2 puffs into the lungs 2 (two) times daily.  1 Inhaler  2  . Calcium Carbonate-Vitamin D (CALTRATE 600+D) 600-400 MG-UNIT per chew tablet Chew 1 tablet by mouth daily.        . Multiple Vitamin (MULTIVITAMIN) capsule Take 1 capsule by mouth daily.        . phentermine (ADIPEX-P) 37.5 MG tablet TAKE ONE TABLET BY MOUTH ONE TIME DAILY   30 tablet  2  . topiramate (TOPAMAX) 25 MG tablet TAKE 1 TABLET (25 MG TOTAL) BY MOUTH 2 (TWO) TIMES DAILY.  60 tablet  6  . topiramate (TOPAMAX) 25 MG tablet Take 1 tablet (25 mg total) by mouth 2 (two) times daily.  60 tablet  6  . vitamin E 100 UNIT capsule Take 100 Units by mouth daily.         No current facility-administered medications on file prior to visit.    BP 140/88  Pulse 91  Temp(Src) 97.8 F (36.6 C) (Oral)  Ht 5\' 4"  (1.626 m)  Wt 183 lb (83.008  kg)  BMI 31.40 kg/m2  SpO2 98%       Objective:   Physical Exam  HENT:  Head: Normocephalic and atraumatic.  Eyes: Conjunctivae are normal. Pupils are equal, round, and reactive to light.  Cardiovascular:  Murmur heard. Abdominal: Soft. Bowel sounds are normal.  Skin: Skin is warm and dry.          Assessment & Plan:  Weight management belviq trial Allergy and maintanance inhailer  goal is 160 \ I have spent more than 30 minutes examining this patient face-to-face of which over half was spent in counseling

## 2013-05-18 NOTE — Patient Instructions (Signed)
The patient is instructed to continue all medications as prescribed. Schedule followup with check out clerk upon leaving the clinic  

## 2013-05-18 NOTE — Progress Notes (Signed)
Pre visit review using our clinic review tool, if applicable. No additional management support is needed unless otherwise documented below in the visit note. 

## 2013-05-21 ENCOUNTER — Encounter: Payer: Self-pay | Admitting: Internal Medicine

## 2013-06-04 ENCOUNTER — Telehealth: Payer: Self-pay | Admitting: Internal Medicine

## 2013-06-04 NOTE — Telephone Encounter (Signed)
Pt is needing results from her labs that were done on 05/18/13

## 2013-06-05 MED ORDER — TOPIRAMATE 25 MG PO TABS: 25.0000 mg | ORAL_TABLET | Freq: Two times a day (BID) | ORAL | Status: DC

## 2013-06-05 MED ORDER — PHENTERMINE HCL 37.5 MG PO CAPS: 37.5000 mg | ORAL_CAPSULE | ORAL | Status: DC

## 2013-06-05 NOTE — Telephone Encounter (Signed)
Pt following up on lab results. Pt states pt the weight loss pill he rx and it does not help at all. Actually makes her hungry and sees herself gaining weight. Med makes her feel weird, gives pt headaches, nervous stomach and turns her stool green. Pt states the first pill he rx her helped her (phentermine 37.5 MG capsule along w/ topamax) pls advise

## 2013-06-05 NOTE — Telephone Encounter (Signed)
Dr Arnoldo Morale can you annotate on these labs, so that i can give the pt her results

## 2013-06-05 NOTE — Telephone Encounter (Signed)
Labs generally great except sed rate showing inflammation  May resume phentermine and topamax   37.5  P  And 25 BID  T

## 2013-06-05 NOTE — Telephone Encounter (Signed)
Pt notified, and phentermine 37.5 mg and topamax 25 mg bid sent in to pharmacy

## 2013-11-11 ENCOUNTER — Encounter: Payer: Self-pay | Admitting: Family Medicine

## 2013-11-11 ENCOUNTER — Ambulatory Visit (INDEPENDENT_AMBULATORY_CARE_PROVIDER_SITE_OTHER): Payer: 59 | Admitting: Family Medicine

## 2013-11-11 VITALS — BP 151/99 | HR 78 | Temp 98.8°F | Resp 18 | Ht 64.0 in | Wt 188.0 lb

## 2013-11-11 DIAGNOSIS — J45909 Unspecified asthma, uncomplicated: Secondary | ICD-10-CM

## 2013-11-11 DIAGNOSIS — E669 Obesity, unspecified: Secondary | ICD-10-CM

## 2013-11-11 DIAGNOSIS — M79609 Pain in unspecified limb: Secondary | ICD-10-CM

## 2013-11-11 DIAGNOSIS — I10 Essential (primary) hypertension: Secondary | ICD-10-CM

## 2013-11-11 DIAGNOSIS — Z0000 Encounter for general adult medical examination without abnormal findings: Secondary | ICD-10-CM

## 2013-11-11 DIAGNOSIS — J4530 Mild persistent asthma, uncomplicated: Secondary | ICD-10-CM | POA: Insufficient documentation

## 2013-11-11 DIAGNOSIS — M79671 Pain in right foot: Secondary | ICD-10-CM

## 2013-11-11 NOTE — Progress Notes (Signed)
Office Note 11/11/2013  CC:  Chief Complaint  Patient presents with  . Establish Care  . Back Pain  . Foot Problem    HPI:  Laura Green is a 58 y.o. Hispanic female who is here to transfer care. Patient's most recent primary MD: Dr. Arnoldo Morale (retiring from clinical practice). Old records in EPIC/[facility] EMR reviewed prior to or during today's visit.  BP up here today, occ home bp checks show 120-130 syst,70-80 diast. Has some problems with joint aches at times that was worse when she weighed more but seems to be coming back more lately.  Had a rheum w/u but this was unremarkable--before results were back she was rx'd low dose prednisone by rheum but this was not continued b/c no sign of inflammitory arthritis.  She is open to trying an NSAID but we did not discuss this problem in much depth today.  Has c/o pain in top of right foot where there is a mild bony deformity that has been present for "a while" and also is getting a bunion on this foot. She has a podiatrist.   Past Medical History  Diagnosis Date  . Perennial allergic rhinitis   . Depression   . Hypertension   . Hyperlipidemia   . Concussion with no loss of consciousness 04/01/2008  . Asthma   . Obesity   . Polyarthralgia     Dr. Ouida Sills did rheum w/u which was neg approx 2012--all this got better with wt loss    Past Surgical History  Procedure Laterality Date  . Cesarean section      x 2  . Rotator cuff repair  Approx 2009    Right  . Left foot surgery      2nd toe dislocation repair + bunion repair ([facility])    Family History  Problem Relation Age of Onset  . Arthritis Mother   . COPD Father   . Hyperlipidemia Father     History   Social History  . Marital Status: Married    Spouse Name: N/A    Number of Children: N/A  . Years of Education: N/A   Occupational History  . Not on file.   Social History Main Topics  . Smoking status: Never Smoker   . Smokeless tobacco: Never Used   . Alcohol Use: No  . Drug Use: No  . Sexual Activity: Yes   Other Topics Concern  . Not on file   Social History Narrative   Married 40+ yrs, two daughters, 3 grandchildren.Marland Kitchen   [place] prior to [place] since [place].   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   No T/A/Ds.   Exercise: Zumba weekly.    Outpatient Encounter Prescriptions as of 11/11/2013  Medication Sig  . albuterol (PROVENTIL HFA) 108 (90 BASE) MCG/ACT inhaler Inhale 2 puffs into the lungs every 4 (four) hours as needed.    . beclomethasone (QVAR) 80 MCG/ACT inhaler Inhale 2 puffs into the lungs 2 (two) times daily.  . Calcium Carbonate-Vitamin D (CALTRATE 600+D) 600-400 MG-UNIT per chew tablet Chew 1 tablet by mouth daily.    . cetirizine (ZYRTEC) 10 MG tablet Take 10 mg by mouth daily.  . fluticasone (FLONASE) 50 MCG/ACT nasal spray Place 2 sprays into both nostrils daily.  . Multiple Vitamin (MULTIVITAMIN) capsule Take 1 capsule by mouth daily.    . phentermine 37.5 MG capsule Take 1 capsule (37.5 mg total) by mouth every morning.  . vitamin E 100 UNIT capsule  Take 100 Units by mouth daily.    . [DISCONTINUED] augmented betamethasone dipropionate (DIPROLENE-AF) 0.05 % cream Apply topically 2 (two) times daily.    . [DISCONTINUED] topiramate (TOPAMAX) 25 MG tablet Take 1 tablet (25 mg total) by mouth 2 (two) times daily.    Allergies  Allergen Reactions  . Augmentin [Amoxicillin-Pot Clavulanate] Rash    ROS Review of Systems  Constitutional: Negative for fever and fatigue.  HENT: Negative for congestion and sore throat.   Eyes: Negative for visual disturbance.  Respiratory: Negative for cough.   Cardiovascular: Negative for chest pain.  Gastrointestinal: Negative for nausea and abdominal pain.  Genitourinary: Negative for dysuria.  Musculoskeletal: Positive for arthralgias (primarily low back). Negative for back pain and joint swelling.  Skin: Negative for rash.  Neurological:  Negative for weakness and headaches.  Hematological: Negative for adenopathy.    PE; Blood pressure 151/99, pulse 78, temperature 98.8 F (37.1 C), temperature source Temporal, resp. rate 18, height 5\' 4"  (1.626 m), weight 188 lb (85.276 kg), SpO2 100.00%. Gen: Alert, well appearing, mildly obese AA female in NAD.  Patient is oriented to person, place, time, and situation. AFFECT: pleasant, lucid thought and speech. ZTI:WPYK: no injection, icteris, swelling, or exudate.  EOMI, PERRLA. Mouth: lips without lesion/swelling.  Oral mucosa pink and moist. Oropharynx without erythema, exudate, or swelling.  CV: RRR, no m/r/g.   LUNGS: CTA bilat, nonlabored resps, good aeration in all lung fields. EXT: no clubbing, cyanosis, or edema.    Pertinent labs: NONE today Recent labs:    Chemistry      Component Value Date/Time   NA 141 05/18/2013 1510   K 4.2 05/18/2013 1510   CL 107 05/18/2013 1510   CO2 29 05/18/2013 1510   BUN 15 05/18/2013 1510   CREATININE 0.7 05/18/2013 1510      Component Value Date/Time   CALCIUM 9.2 05/18/2013 1510   ALKPHOS 66 05/18/2013 1510   AST 16 05/18/2013 1510   ALT 18 05/18/2013 1510   BILITOT 0.6 05/18/2013 1510     Lab Results  Component Value Date   WBC 6.1 05/18/2013   HGB 12.6 05/18/2013   HCT 38.5 05/18/2013   MCV 89.0 05/18/2013   PLT 260.0 05/18/2013   Lab Results  Component Value Date   CHOL 222* 09/15/2008   HDL 62.40 09/15/2008   LDLDIRECT 146.6 09/15/2008   TRIG 88.0 09/15/2008   CHOLHDL 4 09/15/2008   Lab Results  Component Value Date   TSH 1.23 05/18/2013     ASSESSMENT AND PLAN:   Obesity (BMI 30-39.9) Some fluctuation of wt lately after initially having great success. Intolerant of topamax--will take this off of her med list.  Mild persistent asthma in adult without complication Recommended she take her QVAR correctly/as a preventative med: take 1 puff bid. She expressed understanding and agreed, also will continue to use albuterol HFA  2p 4h prn. She'll be getting her flu vaccine via her employer.  Hypertension BP up today. Home monitoring has been sparse lately so she'll pick back up on this to make sure we are not missing uncontrolled HTN.  Right foot pain Hallux valgus deformity with additional mid foot bony prominence that is likely a reactive deformity secondary to her hallux valgus. I recommended she call her podiatrist for app to further eval/treatment of this.   An After Visit Summary was printed and given to the patient.  Return for annual CPE at pt's convenience (+ talk about join pains). We went  ahead and did fasting labs today in prep for her upcoming CPE.

## 2013-11-11 NOTE — Assessment & Plan Note (Signed)
Hallux valgus deformity with additional mid foot bony prominence that is likely a reactive deformity secondary to her hallux valgus. I recommended she call her podiatrist for app to further eval/treatment of this.

## 2013-11-11 NOTE — Assessment & Plan Note (Signed)
BP up today. Home monitoring has been sparse lately so she'll pick back up on this to make sure we are not missing uncontrolled HTN.

## 2013-11-11 NOTE — Assessment & Plan Note (Signed)
Some fluctuation of wt lately after initially having great success. Intolerant of topamax--will take this off of her med list.

## 2013-11-11 NOTE — Assessment & Plan Note (Signed)
Recommended she take her QVAR correctly/as a preventative med: take 1 puff bid. She expressed understanding and agreed, also will continue to use albuterol HFA 2p 4h prn. She'll be getting her flu vaccine via her employer.

## 2013-11-11 NOTE — Progress Notes (Signed)
Pre visit review using our clinic review tool, if applicable. No additional management support is needed unless otherwise documented below in the visit note. 

## 2013-11-12 LAB — CBC WITH DIFFERENTIAL/PLATELET
BASOS PCT: 0.8 % (ref 0.0–3.0)
Basophils Absolute: 0 10*3/uL (ref 0.0–0.1)
EOS PCT: 4.3 % (ref 0.0–5.0)
Eosinophils Absolute: 0.2 10*3/uL (ref 0.0–0.7)
HCT: 39.5 % (ref 36.0–46.0)
Hemoglobin: 12.9 g/dL (ref 12.0–15.0)
LYMPHS PCT: 30.1 % (ref 12.0–46.0)
Lymphs Abs: 1.7 10*3/uL (ref 0.7–4.0)
MCHC: 32.8 g/dL (ref 30.0–36.0)
MCV: 89.1 fl (ref 78.0–100.0)
Monocytes Absolute: 0.4 10*3/uL (ref 0.1–1.0)
Monocytes Relative: 7.4 % (ref 3.0–12.0)
NEUTROS PCT: 57.4 % (ref 43.0–77.0)
Neutro Abs: 3.2 10*3/uL (ref 1.4–7.7)
Platelets: 246 10*3/uL (ref 150.0–400.0)
RBC: 4.43 Mil/uL (ref 3.87–5.11)
RDW: 13 % (ref 11.5–15.5)
WBC: 5.7 10*3/uL (ref 4.0–10.5)

## 2013-11-12 LAB — LIPID PANEL
CHOLESTEROL: 244 mg/dL — AB (ref 0–200)
HDL: 65.7 mg/dL (ref >39.00)
LDL Cholesterol: 162 mg/dL — ABNORMAL HIGH (ref 0–99)
NonHDL: 178.3
TRIGLYCERIDES: 81 mg/dL (ref 0.0–149.0)
Total CHOL/HDL Ratio: 4
VLDL: 16.2 mg/dL (ref 0.0–40.0)

## 2013-11-12 LAB — COMPREHENSIVE METABOLIC PANEL
ALBUMIN: 3.3 g/dL — AB (ref 3.5–5.2)
ALT: 19 U/L (ref 0–35)
AST: 16 U/L (ref 0–37)
Alkaline Phosphatase: 70 U/L (ref 39–117)
BILIRUBIN TOTAL: 0.6 mg/dL (ref 0.2–1.2)
BUN: 15 mg/dL (ref 6–23)
CO2: 31 meq/L (ref 19–32)
Calcium: 9.1 mg/dL (ref 8.4–10.5)
Chloride: 102 mEq/L (ref 96–112)
Creatinine, Ser: 0.6 mg/dL (ref 0.4–1.2)
GFR: 104.72 mL/min (ref >60.00)
GLUCOSE: 92 mg/dL (ref 70–99)
Potassium: 4.2 mEq/L (ref 3.5–5.1)
SODIUM: 140 meq/L (ref 135–145)
Total Protein: 7 g/dL (ref 6.0–8.3)

## 2013-11-12 LAB — TSH: TSH: 2.43 u[IU]/mL (ref 0.35–4.50)

## 2013-11-27 ENCOUNTER — Encounter: Payer: Self-pay | Admitting: Family Medicine

## 2013-11-27 ENCOUNTER — Ambulatory Visit (INDEPENDENT_AMBULATORY_CARE_PROVIDER_SITE_OTHER): Payer: 59 | Admitting: Family Medicine

## 2013-11-27 VITALS — BP 144/90 | HR 73 | Temp 98.7°F | Resp 18 | Ht 64.0 in | Wt 189.0 lb

## 2013-11-27 DIAGNOSIS — M1712 Unilateral primary osteoarthritis, left knee: Secondary | ICD-10-CM

## 2013-11-27 DIAGNOSIS — Z1211 Encounter for screening for malignant neoplasm of colon: Secondary | ICD-10-CM

## 2013-11-27 DIAGNOSIS — Z0000 Encounter for general adult medical examination without abnormal findings: Secondary | ICD-10-CM

## 2013-11-27 DIAGNOSIS — M1812 Unilateral primary osteoarthritis of first carpometacarpal joint, left hand: Secondary | ICD-10-CM

## 2013-11-27 DIAGNOSIS — M19042 Primary osteoarthritis, left hand: Secondary | ICD-10-CM

## 2013-11-27 MED ORDER — PHENTERMINE HCL 37.5 MG PO CAPS: 37.5000 mg | ORAL_CAPSULE | ORAL | Status: DC

## 2013-11-27 NOTE — Progress Notes (Signed)
Pre visit review using our clinic review tool, if applicable. No additional management support is needed unless otherwise documented below in the visit note. 

## 2013-11-27 NOTE — Progress Notes (Signed)
Office Note 12/01/2013  CC:  Chief Complaint  Patient presents with  . Annual Exam   HPI:  Laura Green is a 58 y.o. White female who is here for CPE. Gets pap/pelvic/breast exam and mammo arranged soon through her GYN, Dr. Richardson--02/24/14.  Reports recent episode of left knee pain and swelling-she was walking and felt a pop in back of knee and then gradually felt it swell anteriorly and had to walk with it stiff legged.  She presented to [facility] on 11/14/13, x-ray of knee was normal,  and got knee aspirated and the fluid apparently was neg for crystals and did not show cells supportive of infection or inflammitory arthritis.  She was rx'd diclofenac and began feeling better within 24-36h of seeing the MD.   Regarding other joint problems, she reports years of mild right wrist pain mainly at the base of the thumb, esp with flexion of the thumb, worse at the end of a day of work.  No swelling, redness, or warmth of the joint.    Has hx of elevated blood pressure without dx of HTN: home bp's since last visit 2 wks ago show systolic range 509T-267, diastolic range 12W-58.  NO HR numbers recorded.  Past Medical History  Diagnosis Date  . Perennial allergic rhinitis   . Depression   . Hypertension   . Hyperlipidemia   . Concussion with no loss of consciousness 04/01/2008  . Asthma   . Obesity   . Polyarthralgia     Dr. Ouida Sills did rheum w/u which was neg approx 2012--all this got better with wt loss    Past Surgical History  Procedure Laterality Date  . Cesarean section      x 2  . Rotator cuff repair  Approx 2009    Right  . Left foot surgery      2nd toe dislocation repair + bunion repair ([facility])    Family History  Problem Relation Age of Onset  . Arthritis Mother   . Colon cancer Mother   . COPD Father   . Hyperlipidemia Father     History   Social History  . Marital Status: Married    Spouse Name: N/A    Number of  Children: N/A  . Years of Education: N/A   Occupational History  . Not on file.   Social History Main Topics  . Smoking status: Never Smoker   . Smokeless tobacco: Never Used  . Alcohol Use: No  . Drug Use: No  . Sexual Activity: Yes   Other Topics Concern  . Not on file   Social History Narrative   Married 40+ yrs, two daughters, 3 grandchildren.Marland Kitchen   [place] prior to [place] since [place]s.   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   No T/A/Ds.   Exercise: Zumba weekly.    Outpatient Prescriptions Prior to Visit  Medication Sig Dispense Refill  . albuterol (PROVENTIL HFA) 108 (90 BASE) MCG/ACT inhaler Inhale 2 puffs into the lungs every 4 (four) hours as needed.        . beclomethasone (QVAR) 80 MCG/ACT inhaler Inhale 2 puffs into the lungs 2 (two) times daily.  1 Inhaler  2  . Calcium Carbonate-Vitamin D (CALTRATE 600+D) 600-400 MG-UNIT per chew tablet Chew 1 tablet by mouth daily.        . cetirizine (ZYRTEC) 10 MG tablet Take 10 mg by mouth daily.      . fluticasone (FLONASE) 50 MCG/ACT  nasal spray Place 2 sprays into both nostrils daily.  16 g  6  . Multiple Vitamin (MULTIVITAMIN) capsule Take 1 capsule by mouth daily.        . vitamin E 100 UNIT capsule Take 100 Units by mouth daily.        . phentermine 37.5 MG capsule Take 1 capsule (37.5 mg total) by mouth every morning.  30 capsule  3   No facility-administered medications prior to visit.    Allergies  Allergen Reactions  . Augmentin [Amoxicillin-Pot Clavulanate] Rash    ROS Review of Systems  Constitutional: Negative for fever, chills, appetite change and fatigue.  HENT: Negative for congestion, dental problem, ear pain and sore throat.   Eyes: Negative for discharge, redness and visual disturbance.  Respiratory: Negative for cough, chest tightness, shortness of breath and wheezing.   Cardiovascular: Negative for chest pain, palpitations and leg swelling.  Gastrointestinal: Negative  for nausea, vomiting, abdominal pain, diarrhea and blood in stool.  Genitourinary: Negative for dysuria, urgency, frequency, hematuria, flank pain and difficulty urinating.       Nocturia 5-6 times x > 1 yr  Musculoskeletal: Positive for arthralgias (as per HPI). Negative for back pain, joint swelling, myalgias and neck stiffness.  Skin: Negative for pallor and rash.  Neurological: Negative for dizziness, speech difficulty, weakness and headaches.  Hematological: Negative for adenopathy. Does not bruise/bleed easily.  Psychiatric/Behavioral: Negative for confusion and sleep disturbance. The patient is not nervous/anxious.     PE; Blood pressure 144/90, pulse 73, temperature 98.7 F (37.1 C), temperature source Temporal, resp. rate 18, height 5\' 4"  (1.626 m), weight 189 lb (85.73 kg), SpO2 97.00%. Pt examined with CMA Jacklynn Ganong as chaperone. Gen: Alert, well appearing.  Patient is oriented to person, place, time, and situation. AFFECT: pleasant, lucid thought and speech. ENT: Ears: EACs clear, normal epithelium.  TMs with good light reflex and landmarks bilaterally.  Eyes: no injection, icteris, swelling, or exudate.  EOMI, PERRLA. Nose: no drainage or turbinate edema/swelling.  No injection or focal lesion.  Mouth: lips without lesion/swelling.  Oral mucosa pink and moist.  Dentition intact and without obvious caries or gingival swelling.  Oropharynx without erythema, exudate, or swelling.  Neck: supple/nontender.  No LAD, mass, or TM.  Carotid pulses 2+ bilaterally, without bruits. CV: RRR, no m/r/g.   LUNGS: CTA bilat, nonlabored resps, good aeration in all lung fields. ABD: soft, NT, ND, BS normal.  No hepatospenomegaly or mass.  No bruits. EXT: no clubbing, cyanosis, or edema.  Musculoskeletal: no joint swelling, erythema, warmth, or tenderness except mild TTP over right thumb carpometacarpal joint, esp on palmar surface.  Finkelstein's test negative No wrist tendernss or tenderness  over radial styloid.  ROM of all joints intact. Skin - no sores or suspicious lesions or rashes or color changes Breast and pelvic and rectal exams deferred.  Pertinent labs:  Lab Results  Component Value Date   TSH 2.43 11/11/2013   Lab Results  Component Value Date   WBC 5.7 11/11/2013   HGB 12.9 11/11/2013   HCT 39.5 11/11/2013   MCV 89.1 11/11/2013   PLT 246.0 11/11/2013   Lab Results  Component Value Date   CREATININE 0.6 11/11/2013   BUN 15 11/11/2013   NA 140 11/11/2013   K 4.2 11/11/2013   CL 102 11/11/2013   CO2 31 11/11/2013   Lab Results  Component Value Date   ALT 19 11/11/2013   AST 16 11/11/2013   ALKPHOS 70 11/11/2013  BILITOT 0.6 11/11/2013   Lab Results  Component Value Date   CHOL 244* 11/11/2013   Lab Results  Component Value Date   HDL 65.70 11/11/2013   Lab Results  Component Value Date   LDLCALC 162* 11/11/2013   Lab Results  Component Value Date   TRIG 81.0 11/11/2013   Lab Results  Component Value Date   CHOLHDL 4 11/11/2013   No results found for this basename: PSA    ASSESSMENT AND PLAN:   1) 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  2) Elev BP w/out dx of HTN: home bp's show several normal and several in stage 1 HTN range. I would like more data to go on so she'll continue to monitor at home and call numbers to my nurse in 3-4 weeks.  No meds at this time.  Encouraged TLC, DASH diet.  3) Health maintenance exam: Reviewed age and gender appropriate health maintenance issues (prudent diet, regular exercise, health risks of tobacco and excessive alcohol, use of seatbelts, fire alarms in home, use of sunscreen).  Also reviewed age and gender appropriate health screening as well as vaccine recommendations. She'll get flu vaccine via her employer.  Reviewed recent blood work with pt; all fine.  TLC emphasized.  She is afraid of the prep for colonoscopy so she chose to get iFOB testing--this was ordered today for  colon cancer screening. Cervical and breast cancer screening being done via her GYN 01/2014.Marland Kitchen  An After Visit Summary was printed and given to the patient.  FOLLOW UP:  Return in about 1 year (around 11/28/2014) for annual CPE (fasting).

## 2013-12-01 ENCOUNTER — Encounter: Payer: Self-pay | Admitting: Family Medicine

## 2013-12-01 DIAGNOSIS — Z0000 Encounter for general adult medical examination without abnormal findings: Secondary | ICD-10-CM | POA: Insufficient documentation

## 2013-12-01 DIAGNOSIS — M1812 Unilateral primary osteoarthritis of first carpometacarpal joint, left hand: Secondary | ICD-10-CM | POA: Insufficient documentation

## 2013-12-01 DIAGNOSIS — M1712 Unilateral primary osteoarthritis, left knee: Secondary | ICD-10-CM | POA: Insufficient documentation

## 2013-12-21 ENCOUNTER — Telehealth: Payer: Self-pay | Admitting: Family Medicine

## 2013-12-21 NOTE — Telephone Encounter (Signed)
Please advise 

## 2013-12-21 NOTE — Telephone Encounter (Signed)
Brynlee called and would like either Lattie Haw or Dr. Anitra Lauth to call her back. Work number is [phone] or cell [phone]. Her BP is high and keeps getting higher. This am it was 154/98, on Oct 25 it was 170/106. She feels the meds are not working. She stated she was a former patient of Dr. Benay Pillow and when she had high BP with him she took Losartan Potassium, 100mg , x1 day. Hennie said this worked well.  Shelby Mattocks

## 2013-12-22 MED ORDER — LOSARTAN POTASSIUM 100 MG PO TABS: 100.0000 mg | ORAL_TABLET | Freq: Every day | ORAL | Status: DC

## 2013-12-22 NOTE — Telephone Encounter (Signed)
In review of my notes in chart, I don't have her on any bp meds at this time. I was waiting to get report of her home bp measurements. (what med is she referring to when she says "the meds are not working"?   I have eRx'd losartan 100 mg tabs, 1 tab po qd as per her request. Continue home bp monitoring once daily or once every other day, along with heart rate/pulse.  Have her call with report of these in 7-10 days, earlier if questions.-thx

## 2013-12-22 NOTE — Telephone Encounter (Signed)
Patient aware.  She will contact us in 7-10 days w/ BP and HR readings.

## 2014-01-20 ENCOUNTER — Ambulatory Visit (INDEPENDENT_AMBULATORY_CARE_PROVIDER_SITE_OTHER): Payer: 59 | Admitting: Family Medicine

## 2014-01-20 ENCOUNTER — Encounter: Payer: Self-pay | Admitting: Family Medicine

## 2014-01-20 VITALS — BP 148/88 | HR 83 | Temp 98.0°F | Resp 18 | Ht 64.0 in | Wt 188.0 lb

## 2014-01-20 DIAGNOSIS — J309 Allergic rhinitis, unspecified: Secondary | ICD-10-CM

## 2014-01-20 DIAGNOSIS — J0101 Acute recurrent maxillary sinusitis: Secondary | ICD-10-CM

## 2014-01-20 DIAGNOSIS — J019 Acute sinusitis, unspecified: Secondary | ICD-10-CM

## 2014-01-20 DIAGNOSIS — J45991 Cough variant asthma: Secondary | ICD-10-CM

## 2014-01-20 DIAGNOSIS — I10 Essential (primary) hypertension: Secondary | ICD-10-CM

## 2014-01-20 HISTORY — DX: Allergic rhinitis, unspecified: J30.9

## 2014-01-20 HISTORY — DX: Acute sinusitis, unspecified: J01.90

## 2014-01-20 MED ORDER — AZITHROMYCIN 250 MG PO TABS: ORAL_TABLET | ORAL | Status: DC

## 2014-01-20 MED ORDER — IRBESARTAN-HYDROCHLOROTHIAZIDE 300-12.5 MG PO TABS: 1.0000 | ORAL_TABLET | Freq: Every day | ORAL | Status: DC

## 2014-01-20 MED ORDER — PREDNISONE 20 MG PO TABS: ORAL_TABLET | ORAL | Status: DC

## 2014-01-20 NOTE — Progress Notes (Signed)
OFFICE NOTE  01/20/2014  CC:  Chief Complaint  Patient presents with  . Nasal Congestion    and chest congestion   HPI: Patient is a 58 y.o. Caucasian female who is here for respiratory symptoms.   Onset 5 d/a, nasal congestion/mucous, PND, ST, ears buzzing, lots of sneezing, lots of coughing. No fevers.  A little pain in face and upper teeth.  Delsym otc hs helped a little.  Also, bp's up lately: avg systolic in 381R, avg diast 80s, with an isolated measurement of 711 diastolic.  Pertinent PMH:  Past medical, surgical, social, and family history reviewed and no changes are noted since last office visit.  MEDS:  Outpatient Prescriptions Prior to Visit  Medication Sig Dispense Refill  . albuterol (PROVENTIL HFA) 108 (90 BASE) MCG/ACT inhaler Inhale 2 puffs into the lungs every 4 (four) hours as needed.      . beclomethasone (QVAR) 80 MCG/ACT inhaler Inhale 2 puffs into the lungs 2 (two) times daily. 1 Inhaler 2  . Calcium Carbonate-Vitamin D (CALTRATE 600+D) 600-400 MG-UNIT per chew tablet Chew 1 tablet by mouth daily.      . cetirizine (ZYRTEC) 10 MG tablet Take 10 mg by mouth daily.    . diclofenac (VOLTAREN) 75 MG EC tablet Take 75 mg by mouth 2 (two) times daily.    . fluticasone (FLONASE) 50 MCG/ACT nasal spray Place 2 sprays into both nostrils daily. 16 g 6  . losartan (COZAAR) 100 MG tablet Take 1 tablet (100 mg total) by mouth daily. 30 tablet 6  . Multiple Vitamin (MULTIVITAMIN) capsule Take 1 capsule by mouth daily.      . phentermine 37.5 MG capsule Take 1 capsule (37.5 mg total) by mouth every morning. 30 capsule 0  . vitamin E 100 UNIT capsule Take 100 Units by mouth daily.       No facility-administered medications prior to visit.    PE: Blood pressure 148/88, pulse 83, temperature 98 F (36.7 C), temperature source Oral, resp. rate 18, height 5\' 4"  (1.626 m), weight 188 lb (85.276 kg), SpO2 96 %. VS: noted--normal. Gen: alert, NAD, NONTOXIC APPEARING. HEENT:  eyes without injection, drainage, or swelling.  Ears: EACs clear, TMs with normal light reflex and landmarks.  Nose: Clear rhinorrhea, with some dried, crusty exudate adherent to mildly injected mucosa.  No purulent d/c.  L>R paranasal sinus TTP.  No facial swelling.  Throat and mouth without focal lesion.  No pharyngial swelling, erythema, or exudate.   Neck: supple, no LAD.   LUNGS: CTA bilat, nonlabored resps.   CV: RRR, no m/r/g. EXT: no c/c/e SKIN: no rash  IMPRESSION AND PLAN:  1) Recurrent sinusitis: infectious + allergic.  Also, has cough-variant asthma. Prednisone 40mg  qd x 2d, then 20mg  qd x 2d, then 10mg  qd x 2d. Zpack. Continue basic symptomatic care, avoiding otc decongestants.  2) HTN, poor control.   D/C cozaar. Start avalide 300/12.5, 1 tab po qd. Hold phentermine until bp well controlled.  An After Visit Summary was printed and given to the patient.  FOLLOW UP: 6 wks

## 2014-01-20 NOTE — Patient Instructions (Signed)
Stop phentermine until your blood pressure is consistently less than 135/85.

## 2014-01-20 NOTE — Progress Notes (Signed)
Pre visit review using our clinic review tool, if applicable. No additional management support is needed unless otherwise documented below in the visit note. 

## 2014-01-27 ENCOUNTER — Telehealth: Payer: Self-pay | Admitting: Family Medicine

## 2014-01-27 NOTE — Telephone Encounter (Signed)
emmi emailed °

## 2014-01-28 ENCOUNTER — Telehealth: Payer: Self-pay

## 2014-01-28 NOTE — Telephone Encounter (Signed)
Pls call pt and advise her to cut her new bp med in half instead of taking a whole pill. Continue to check bp 1-2 times a day.  Advise pt to call back with report of bp's and how she feels sometime mid next week.-thx

## 2014-01-28 NOTE — Telephone Encounter (Signed)
Pt called to let Dr Anitra Lauth know her blood pressure went down to 53/42 last night and she was feeling like she was going to pass out. Her husband tried to take her to the hospital but she refused but drank some juice and felt better.. She doesn't know if her medication is too strong for her. She took her medication today and felt ok but didn't check her BP. Please advise. Should pt return to clinic for bp check and eval. Please advise.

## 2014-01-29 NOTE — Telephone Encounter (Signed)
SPoke with pt, advised message from Dr Anitra Lauth. Pt understood.

## 2014-03-12 ENCOUNTER — Ambulatory Visit (INDEPENDENT_AMBULATORY_CARE_PROVIDER_SITE_OTHER): Payer: 59 | Admitting: Nurse Practitioner

## 2014-03-12 ENCOUNTER — Encounter: Payer: Self-pay | Admitting: Nurse Practitioner

## 2014-03-12 VITALS — BP 151/95 | HR 73 | Temp 98.4°F | Ht 64.0 in | Wt 192.0 lb

## 2014-03-12 DIAGNOSIS — I10 Essential (primary) hypertension: Secondary | ICD-10-CM

## 2014-03-12 MED ORDER — IRBESARTAN-HYDROCHLOROTHIAZIDE 150-12.5 MG PO TABS: 1.0000 | ORAL_TABLET | Freq: Every day | ORAL | Status: DC

## 2014-03-12 NOTE — Progress Notes (Signed)
Subjective:    Patient here for follow-up of elevated blood pressure. She was seen about 6 weeks ago for nasal congestion-BP was elevated. Cozar was d/c'd & avalide 300 mg started. Pt had episode of low pressure-60/50. She called ofc & was instructed to cut tablet in half.  Since then, Blood pressure is well controlled at home- Home readings have been 130's /80's.  Cardiac symptoms: none. Patient denies: chest pain, lower extremity edema and palpitations. Cardiovascular risk factors: dyslipidemia, hypertension, obesity (BMI >= 30 kg/m2) and sedentary lifestyle. Use of agents associated with hypertension: was on phentermine, but was d/c'd at last OV due to BP not well controlled.  She admits to forgetting to take BP med daily.  She is not exercising and is not adherent to a low-salt diet.  She admits to eating chocolate daily. She stopped zumba due to knee effusion. She had intra-articular aspiration of L knee at UC. Has appt w/ortho next week for further eval..  The following portions of the patient's history were reviewed and updated as appropriate: allergies, current medications, past medical history, past social history, past surgical history and problem list.  Review of Systems Constitutional: negative for fatigue Respiratory: negative for cough Cardiovascular: negative for chest pressure/discomfort, lower extremity edema and palpitations     Objective:    BP 151/95 mmHg  Pulse 73  Temp(Src) 98.4 F (36.9 C) (Temporal)  Ht 5\' 4"  (1.626 m)  Wt 192 lb (87.091 kg)  BMI 32.94 kg/m2  SpO2 98% General appearance: alert, cooperative, appears stated age and no distress Head: Normocephalic, without obvious abnormality, atraumatic Eyes: negative findings: lids and lashes normal and conjunctivae and sclerae normal   Assessment:Plan   1. Essential hypertension, benign Counseled on wt loss. Encouraged to cut out sugar. Consider water exercise until knee gets better. Gave handout on best  nutrition. - irbesartan-hydrochlorothiazide (AVALIDE) 150-12.5 MG per tablet; Take 1 tablet by mouth daily.  Dispense: 30 tablet; Refill: 3 See pt instructions. F/u 3 mos w/Dr McGowen.

## 2014-03-12 NOTE — Patient Instructions (Addendum)
Continue with 1/2 Tab AVALIDE. When you fill new script, you will take 1 tab, as it will be correct dose. Put it beside your toothbrush-somewhere that you will remember to take it everyday.  Consider water exercise until your orthopedic problems resolve.  Refer to handout for best nutrition. Apply the principles for weight loss & to decrease your risk for chronic diseases.  Most important:  Cut out refined sugar-anything that is sweet when you eat or drink it except fresh fruit.  Cut out white bread, rolls, biscuits, bagels, muffins, pasta and cereals.  Breads & cereals that have 4 gm or more of fiber per serving are good. Whole wheat pasta, brown rice and quinoa are good.  Nice to meet you!

## 2014-03-12 NOTE — Progress Notes (Signed)
Pre visit review using our clinic review tool, if applicable. No additional management support is needed unless otherwise documented below in the visit note. 

## 2014-03-17 ENCOUNTER — Ambulatory Visit (INDEPENDENT_AMBULATORY_CARE_PROVIDER_SITE_OTHER): Payer: 59 | Admitting: Family Medicine

## 2014-03-17 ENCOUNTER — Encounter: Payer: Self-pay | Admitting: Family Medicine

## 2014-03-17 VITALS — BP 138/86 | HR 79 | Temp 98.8°F | Resp 18 | Ht 64.0 in | Wt 191.0 lb

## 2014-03-17 DIAGNOSIS — J018 Other acute sinusitis: Secondary | ICD-10-CM

## 2014-03-17 MED ORDER — AZITHROMYCIN 250 MG PO TABS: ORAL_TABLET | ORAL | Status: DC

## 2014-03-17 NOTE — Progress Notes (Signed)
Pre visit review using our clinic review tool, if applicable. No additional management support is needed unless otherwise documented below in the visit note. 

## 2014-03-17 NOTE — Progress Notes (Signed)
OFFICE NOTE  03/17/2014  CC:  Chief Complaint  Patient presents with  . Eye Drainage  . URI    x Sunday   HPI: Patient is a 59 y.o. Caucasian female who is here for cough. Onset 4d ago, excessive nasal congestion, facial pressure/eye pressure/ears pressure.  Dry cough.  Irritated/dry throat from breathing through mouth.  Lots of tearing up from both eyes, mostly right eye.  No fevers.  She has tried saline nasal solution and flonase: these bring brief relief.  No SOB or wheezing.  Pertinent PMH:  Past medical, surgical, social, and family history reviewed and no changes are noted since last office visit. +asthma+ allergies.  MEDS: Pt not taking prednisone or azithromycin listed below Outpatient Prescriptions Prior to Visit  Medication Sig Dispense Refill  . albuterol (PROVENTIL HFA) 108 (90 BASE) MCG/ACT inhaler Inhale 2 puffs into the lungs every 4 (four) hours as needed.      . beclomethasone (QVAR) 80 MCG/ACT inhaler Inhale 2 puffs into the lungs 2 (two) times daily. 1 Inhaler 2  . Calcium Carbonate-Vitamin D (CALTRATE 600+D) 600-400 MG-UNIT per chew tablet Chew 1 tablet by mouth daily.      . cetirizine (ZYRTEC) 10 MG tablet Take 10 mg by mouth daily.    . diclofenac (VOLTAREN) 75 MG EC tablet Take 75 mg by mouth 2 (two) times daily.    . fluticasone (FLONASE) 50 MCG/ACT nasal spray Place 2 sprays into both nostrils daily. 16 g 6  . irbesartan-hydrochlorothiazide (AVALIDE) 150-12.5 MG per tablet Take 1 tablet by mouth daily. 30 tablet 3  . Multiple Vitamin (MULTIVITAMIN) capsule Take 1 capsule by mouth daily.      . vitamin E 100 UNIT capsule Take 100 Units by mouth daily.      . phentermine 37.5 MG capsule Take 1 capsule (37.5 mg total) by mouth every morning. (Patient not taking: Reported on 03/12/2014) 30 capsule 0  . azithromycin (ZITHROMAX) 250 MG tablet 2 tabs po qd x 1d, then 1 tab po qd x 4d (Patient not taking: Reported on 03/12/2014) 6 tablet 0  . predniSONE (DELTASONE) 20  MG tablet 2 tabs po qd x 2d, then 1 tab po qd x 2d, then 1/2 tab po qd x 2d (Patient not taking: Reported on 03/12/2014) 7 tablet 0   No facility-administered medications prior to visit.    PE: Blood pressure 138/86, pulse 79, temperature 98.8 F (37.1 C), temperature source Temporal, resp. rate 18, height 5\' 4"  (1.626 m), weight 191 lb (86.637 kg), SpO2 99 %. VS: noted--normal. Gen: alert, NAD, NONTOXIC APPEARING. HEENT: eyes without injection, drainage, or swelling.  Ears: EACs clear, TMs with normal light reflex and landmarks.  Nose: Clear rhinorrhea, with some dried, crusty exudate adherent to mildly injected mucosa.  No purulent d/c.  Diffuse, R>L paranasal sinus TTP.  Minimal infraorbital facial swelling.  Throat and mouth without focal lesion.  No pharyngial swelling, erythema, or exudate.   Neck: supple, no LAD.   LUNGS: CTA bilat, nonlabored resps.   CV: RRR, no m/r/g. EXT: no c/c/e SKIN: no rash  IMPRESSION AND PLAN:  Acute sinusitis, severe URI sx's. I think her upper facial edema/swelling from sinusitis is causing impaired nasolacrimal drainage, R>L. Start z-pack. FLonase qd, zyrtec qd, saline irrigation prn. Signs/symptoms to call or return for were reviewed and pt expressed understanding.  An After Visit Summary was printed and given to the patient.  FOLLOW UP: prn

## 2014-05-24 ENCOUNTER — Telehealth: Payer: Self-pay

## 2014-05-24 NOTE — Telephone Encounter (Signed)
error 

## 2014-05-26 ENCOUNTER — Ambulatory Visit (INDEPENDENT_AMBULATORY_CARE_PROVIDER_SITE_OTHER): Payer: 59 | Admitting: Family Medicine

## 2014-05-26 ENCOUNTER — Encounter: Payer: Self-pay | Admitting: Family Medicine

## 2014-05-26 VITALS — BP 146/89 | HR 67 | Temp 97.6°F | Ht 64.0 in | Wt 193.0 lb

## 2014-05-26 DIAGNOSIS — M791 Myalgia: Secondary | ICD-10-CM | POA: Diagnosis not present

## 2014-05-26 DIAGNOSIS — R5382 Chronic fatigue, unspecified: Secondary | ICD-10-CM | POA: Diagnosis not present

## 2014-05-26 DIAGNOSIS — M7918 Myalgia, other site: Principal | ICD-10-CM

## 2014-05-26 DIAGNOSIS — M2550 Pain in unspecified joint: Secondary | ICD-10-CM | POA: Diagnosis not present

## 2014-05-26 DIAGNOSIS — G8929 Other chronic pain: Secondary | ICD-10-CM | POA: Diagnosis not present

## 2014-05-26 MED ORDER — PHENTERMINE HCL 37.5 MG PO CAPS: 37.5000 mg | ORAL_CAPSULE | ORAL | Status: DC

## 2014-05-26 NOTE — Progress Notes (Signed)
Pre visit review using our clinic review tool, if applicable. No additional management support is needed unless otherwise documented below in the visit note. 

## 2014-05-26 NOTE — Progress Notes (Signed)
OFFICE NOTE  05/26/2014  CC: Leg cramps   HPI: Patient is a 59 y.o. Caucasian female who is here for leg cramps.. She then expands this and essentially says she hurts "all over " and is chronically fatigued. This has been a problem for at least 5 years.  Past w/u by rheumatologist showed no abnormalities, and the explanation was that she was overweight.  She was put on phentermine and says she lost wt AND it gave her energy and she felt much improved.  She is very frustrated with this b/c she is usually an energetic person with a lot of drive and feels very hindered by her chronic pain/fatigue symptoms.  NSAIDs don't help any.  She does not know if she snores.  No witnessed apneic spells in sleep.   Typically gets 7-8 hours of sleep per night.  Sometimes she feels like she hasn't gotten any sleep.  No unexplained fevers, no rashes, no eye redness/pain, no GU complaints.  No melena/hematochezia.     Pertinent PMH:  Past medical, surgical, social, and family history reviewed and no changes are noted since last office visit.  MEDS: Not taking azithromycin listed below Outpatient Prescriptions Prior to Visit  Medication Sig Dispense Refill  . albuterol (PROVENTIL HFA) 108 (90 BASE) MCG/ACT inhaler Inhale 2 puffs into the lungs every 4 (four) hours as needed.      . beclomethasone (QVAR) 80 MCG/ACT inhaler Inhale 2 puffs into the lungs 2 (two) times daily. 1 Inhaler 2  . Calcium Carbonate-Vitamin D (CALTRATE 600+D) 600-400 MG-UNIT per chew tablet Chew 1 tablet by mouth daily.      . cetirizine (ZYRTEC) 10 MG tablet Take 10 mg by mouth daily.    . diclofenac (VOLTAREN) 75 MG EC tablet Take 75 mg by mouth 2 (two) times daily.    . fluticasone (FLONASE) 50 MCG/ACT nasal spray Place 2 sprays into both nostrils daily. 16 g 6  . irbesartan-hydrochlorothiazide (AVALIDE) 150-12.5 MG per tablet Take 1 tablet by mouth daily. 30 tablet 3  . Multiple Vitamin (MULTIVITAMIN) capsule Take 1 capsule by  mouth daily.      . vitamin E 100 UNIT capsule Take 100 Units by mouth daily.      . phentermine 37.5 MG capsule Take 1 capsule (37.5 mg total) by mouth every morning. (Patient not taking: Reported on 03/12/2014) 30 capsule 0  . azithromycin (ZITHROMAX) 250 MG tablet 2 tabs po qd x 1d, then 1 tab po qd x 4d (Patient not taking: Reported on 05/26/2014) 6 tablet 0   No facility-administered medications prior to visit.    PE: Blood pressure 146/89, pulse 67, temperature 97.6 F (36.4 C), temperature source Oral, height 5\' 4"  (1.626 m), weight 193 lb (87.544 kg), SpO2 99 %. Gen: Alert, well appearing, overweight-appearing.  Patient is oriented to person, place, time, and situation. AFFECT: pleasant, lucid thought and speech. HYW:VPXT: no injection, icteris, swelling, or exudate.  EOMI, PERRLA. Mouth: lips without lesion/swelling.  Oral mucosa pink and moist. Oropharynx without erythema, exudate, or swelling.  CV: RRR, no m/r/g.   LUNGS: CTA bilat, nonlabored resps, good aeration in all lung fields. EXT: no clubbing, cyanosis, or edema.  MUSCULO: she has soft tissue tenderness diffusely in neck, traps, paraspinous soft tissues of back, upper arms, thighs.  No joint swelling, erythema, warmth, or stiffness.   IMPRESSION AND PLAN:  Chronic myofascial pain and arthralgias with chronic fatigue. Has been going on >5 yrs, rheum w/u a few years ago was unremarkable.  Phentermine helped with wt loss and energy level and pt says she thinks she felt "great" while taking this med. She would like to get back on this med and see how she does. I suspect fibromyalgia but admittedly it would be a later onset than typical for this condition. Printed rx's for phentermine 37.5mg , 1 cap po qAM, #30 that she can fill now and another that she may fill in 1 mo (approp fill on/after date on rx).  She will monitor her home bp on this med (she says in the past her bp actually improved while on this med). If not improved  at f/u in 4-6 wks then will likely repeat some rheum labs and consider trial of different fibromyalgia med.  An After Visit Summary was printed and given to the patient.  FOLLOW UP: 4-6 wks

## 2014-06-15 ENCOUNTER — Ambulatory Visit: Payer: 59 | Admitting: Family Medicine

## 2014-06-30 ENCOUNTER — Ambulatory Visit: Payer: 59 | Admitting: Family Medicine

## 2014-07-08 ENCOUNTER — Encounter: Payer: Self-pay | Admitting: Family Medicine

## 2014-07-08 DIAGNOSIS — N6310 Unspecified lump in the right breast, unspecified quadrant: Secondary | ICD-10-CM

## 2014-07-08 HISTORY — DX: Unspecified lump in the right breast, unspecified quadrant: N63.10

## 2014-07-13 ENCOUNTER — Encounter: Payer: Self-pay | Admitting: Family Medicine

## 2014-07-30 ENCOUNTER — Encounter: Payer: Self-pay | Admitting: Family Medicine

## 2014-08-02 ENCOUNTER — Encounter: Payer: Self-pay | Admitting: Family Medicine

## 2014-08-06 ENCOUNTER — Other Ambulatory Visit: Payer: Self-pay | Admitting: *Deleted

## 2014-08-06 MED ORDER — PHENTERMINE HCL 37.5 MG PO CAPS: 37.5000 mg | ORAL_CAPSULE | ORAL | Status: DC

## 2014-08-06 NOTE — Telephone Encounter (Signed)
Fax from [facility]) requesting refill for Phentermine 37.5mg . LOV 05/26/14, up coming ov 12/02/14, last written: 05/26/14 #30 w/ 0RF. Please advise. Thanks.

## 2014-09-13 ENCOUNTER — Other Ambulatory Visit: Payer: Self-pay | Admitting: Family Medicine

## 2014-09-13 DIAGNOSIS — I10 Essential (primary) hypertension: Secondary | ICD-10-CM

## 2014-09-13 MED ORDER — IRBESARTAN-HYDROCHLOROTHIAZIDE 150-12.5 MG PO TABS: 1.0000 | ORAL_TABLET | Freq: Every day | ORAL | Status: DC

## 2014-09-16 ENCOUNTER — Other Ambulatory Visit: Payer: Self-pay | Admitting: Family Medicine

## 2014-09-16 ENCOUNTER — Other Ambulatory Visit: Payer: Self-pay | Admitting: *Deleted

## 2014-09-16 MED ORDER — PHENTERMINE HCL 37.5 MG PO CAPS: 37.5000 mg | ORAL_CAPSULE | ORAL | Status: DC

## 2014-09-16 NOTE — Telephone Encounter (Signed)
RF request for phentermine.  LOV: 05/26/14 Next ov: 12/02/14 Last written: 08/06/14 #30 w/ 5CY

## 2014-10-18 ENCOUNTER — Other Ambulatory Visit: Payer: Self-pay | Admitting: Family Medicine

## 2014-10-18 ENCOUNTER — Other Ambulatory Visit: Payer: Self-pay | Admitting: *Deleted

## 2014-10-18 MED ORDER — PHENTERMINE HCL 37.5 MG PO CAPS: 37.5000 mg | ORAL_CAPSULE | ORAL | Status: DC

## 2014-10-18 NOTE — Telephone Encounter (Signed)
RF request for phentermine LOV: 05/26/14 Next ov: 12/02/14 Last written: 09/16/14 #30 w/ 0RF Please advise. Thanks.

## 2014-10-18 NOTE — Telephone Encounter (Signed)
Error, Rx voided. Pt still had Rx on hold at pharmacy.

## 2014-10-22 ENCOUNTER — Ambulatory Visit: Payer: 59 | Admitting: Family Medicine

## 2014-11-29 ENCOUNTER — Other Ambulatory Visit: Payer: 59

## 2014-12-02 ENCOUNTER — Encounter: Payer: 59 | Admitting: Family Medicine

## 2014-12-08 ENCOUNTER — Other Ambulatory Visit: Payer: Self-pay | Admitting: *Deleted

## 2014-12-08 MED ORDER — PHENTERMINE HCL 37.5 MG PO CAPS: 37.5000 mg | ORAL_CAPSULE | ORAL | Status: DC

## 2014-12-08 NOTE — Telephone Encounter (Signed)
Pt advised and voiced understanding.  Apt made for 01/04/15 at 9:15am. Rx faxed.

## 2014-12-08 NOTE — Telephone Encounter (Signed)
OK, will do this one rx and then she needs to be seen for f/u before next rx.-thx

## 2014-12-08 NOTE — Telephone Encounter (Signed)
RF request for phentermine LOV: 05/26/14  Next ov: None has cancelled last few apts Last written: 10/18/14 #30 w/ 0RF Please advise. Thanks.

## 2015-01-04 ENCOUNTER — Encounter: Payer: 59 | Admitting: Family Medicine

## 2015-01-06 ENCOUNTER — Telehealth: Payer: Self-pay | Admitting: *Deleted

## 2015-01-06 DIAGNOSIS — I10 Essential (primary) hypertension: Secondary | ICD-10-CM

## 2015-01-06 MED ORDER — IRBESARTAN-HYDROCHLOROTHIAZIDE 150-12.5 MG PO TABS: 1.0000 | ORAL_TABLET | Freq: Every day | ORAL | Status: DC

## 2015-01-06 NOTE — Telephone Encounter (Signed)
RF request for irbesartan/hctz LOV: 05/06/14 Next ov: None Last written: 09/13/14 #30 w/ 3RF  Pt needs ov for more refills. Rx sent today for #30 w/ 0RF.

## 2015-01-07 NOTE — Telephone Encounter (Signed)
Pt advised and voiced understanding. She stated that she will call back to schedule an apt. 

## 2015-01-13 ENCOUNTER — Encounter: Payer: Self-pay | Admitting: Family Medicine

## 2015-02-10 ENCOUNTER — Other Ambulatory Visit: Payer: Self-pay | Admitting: *Deleted

## 2015-02-10 DIAGNOSIS — I10 Essential (primary) hypertension: Secondary | ICD-10-CM

## 2015-02-10 MED ORDER — IRBESARTAN-HYDROCHLOROTHIAZIDE 150-12.5 MG PO TABS: 1.0000 | ORAL_TABLET | Freq: Every day | ORAL | Status: DC

## 2015-02-10 NOTE — Telephone Encounter (Signed)
Left message for pt to call back  °

## 2015-02-10 NOTE — Telephone Encounter (Signed)
Will send in 1 mo supply with 2 RFs. Pt needs o/v for CPE (fasting) sometime in the next 3 mo-thx

## 2015-02-10 NOTE — Telephone Encounter (Signed)
RF request for irbesartan/hctz LOV: 05/26/14 Next ov:  None - pt cancelled apt on 01/04/15 Last written: 01/06/15 #30 w/ 0RF  Please advise. Thanks.

## 2015-02-14 NOTE — Telephone Encounter (Signed)
Pt advised and voiced understanding.  CPE made for 03/09/15 at 8:00am.

## 2015-03-09 ENCOUNTER — Encounter: Payer: Self-pay | Admitting: Family Medicine

## 2015-03-09 ENCOUNTER — Ambulatory Visit (INDEPENDENT_AMBULATORY_CARE_PROVIDER_SITE_OTHER): Payer: 59 | Admitting: Family Medicine

## 2015-03-09 VITALS — BP 103/73 | HR 84 | Temp 97.8°F | Resp 16 | Ht 63.25 in | Wt 192.5 lb

## 2015-03-09 DIAGNOSIS — Z0000 Encounter for general adult medical examination without abnormal findings: Secondary | ICD-10-CM

## 2015-03-09 DIAGNOSIS — J4530 Mild persistent asthma, uncomplicated: Secondary | ICD-10-CM | POA: Diagnosis not present

## 2015-03-09 DIAGNOSIS — Z1211 Encounter for screening for malignant neoplasm of colon: Secondary | ICD-10-CM

## 2015-03-09 LAB — CBC WITH DIFFERENTIAL/PLATELET
BASOS ABS: 0.1 10*3/uL (ref 0.0–0.1)
Basophils Relative: 1.7 % (ref 0.0–3.0)
EOS ABS: 0.2 10*3/uL (ref 0.0–0.7)
Eosinophils Relative: 3.4 % (ref 0.0–5.0)
HCT: 41.9 % (ref 36.0–46.0)
Hemoglobin: 13.4 g/dL (ref 12.0–15.0)
LYMPHS ABS: 1.4 10*3/uL (ref 0.7–4.0)
Lymphocytes Relative: 26.9 % (ref 12.0–46.0)
MCHC: 32 g/dL (ref 30.0–36.0)
MCV: 89.8 fl (ref 78.0–100.0)
MONO ABS: 0.5 10*3/uL (ref 0.1–1.0)
Monocytes Relative: 9.7 % (ref 3.0–12.0)
Neutro Abs: 2.9 10*3/uL (ref 1.4–7.7)
Neutrophils Relative %: 58.3 % (ref 43.0–77.0)
Platelets: 292 10*3/uL (ref 150.0–400.0)
RBC: 4.66 Mil/uL (ref 3.87–5.11)
RDW: 13.6 % (ref 11.5–15.5)
WBC: 5 10*3/uL (ref 4.0–10.5)

## 2015-03-09 LAB — LIPID PANEL
CHOL/HDL RATIO: 4
Cholesterol: 245 mg/dL — ABNORMAL HIGH (ref 0–200)
HDL: 65.2 mg/dL (ref >39.00)
LDL Cholesterol: 162 mg/dL — ABNORMAL HIGH (ref 0–99)
NONHDL: 180.06
Triglycerides: 92 mg/dL (ref 0.0–149.0)
VLDL: 18.4 mg/dL (ref 0.0–40.0)

## 2015-03-09 LAB — COMPREHENSIVE METABOLIC PANEL
ALBUMIN: 3.4 g/dL — AB (ref 3.5–5.2)
ALK PHOS: 65 U/L (ref 39–117)
ALT: 19 U/L (ref 0–35)
AST: 15 U/L (ref 0–37)
BUN: 16 mg/dL (ref 6–23)
CO2: 34 mEq/L — ABNORMAL HIGH (ref 19–32)
Calcium: 9.2 mg/dL (ref 8.4–10.5)
Chloride: 103 mEq/L (ref 96–112)
Creatinine, Ser: 0.64 mg/dL (ref 0.40–1.20)
GFR: 100.5 mL/min (ref >60.00)
Glucose, Bld: 88 mg/dL (ref 70–99)
POTASSIUM: 4 meq/L (ref 3.5–5.1)
Sodium: 143 mEq/L (ref 135–145)
TOTAL PROTEIN: 6.3 g/dL (ref 6.0–8.3)
Total Bilirubin: 0.7 mg/dL (ref 0.2–1.2)

## 2015-03-09 LAB — TSH: TSH: 1.87 u[IU]/mL (ref 0.35–4.50)

## 2015-03-09 MED ORDER — PHENTERMINE HCL 37.5 MG PO CAPS: 37.5000 mg | ORAL_CAPSULE | ORAL | Status: DC

## 2015-03-09 MED ORDER — BECLOMETHASONE DIPROPIONATE 80 MCG/ACT IN AERS: 2.0000 | INHALATION_SPRAY | Freq: Two times a day (BID) | RESPIRATORY_TRACT | Status: DC

## 2015-03-09 MED ORDER — ALBUTEROL SULFATE HFA 108 (90 BASE) MCG/ACT IN AERS: 2.0000 | INHALATION_SPRAY | RESPIRATORY_TRACT | Status: DC | PRN

## 2015-03-09 NOTE — Progress Notes (Signed)
Office Note 03/09/2015  CC:  Chief Complaint  Patient presents with  . Annual Exam    Pt is fasting.     HPI:  Laura Green is a 60 y.o. White female who is here for annual health maintenance exam. Pt has GYN: got pap 12/2014.  Mammogram UTD.  More coughing and wheezing and SOB lately, even with use of inhalers.  More with activity on weekends. No chest pains.  Admits she ran out of QVAR "a while ago" so she has not been on this med with any regularity lately.  Uses proair and this helps w/in 30 min (avg 8-10 times per month daytime, 4-6 times per month, night-time).  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 This alleviated her pain but she still feels excessive hunger and bloating from the med.   Past Medical History  Diagnosis Date  . Perennial allergic rhinitis   . Depression   . Hypertension   . Hyperlipidemia   . Concussion with no loss of consciousness 04/01/2008  . Asthma   . Obesity   . Polyarthralgia     Dr. Ouida Sills did rheum w/u which was neg approx 2012--all this got better with wt loss  . Breast mass, right 07/08/14    Two 7 mm masses, ultrasound impression "low suspicion of malignancy" bx showed benign fibrocystic breast tissue; repeat R mammo 01/12/15 [facility]-- Repeat diagnostic R breast mammo recommended 6 mo    Past Surgical History  Procedure Laterality Date  . Cesarean section      x 2  . Rotator cuff repair  Approx 2009    Right  . Left foot surgery      2nd toe dislocation repair + bunion repair ([facility])    Family History  Problem Relation Age of Onset  . Arthritis Mother   . Colon cancer Mother   . COPD Father   . Hyperlipidemia Father     Social History   Social History  . Marital Status: Married    Spouse Name: N/A  . Number of Children: N/A  . Years of Education: N/A   Occupational History  . Not on file.   Social History Main Topics  . Smoking status: Never Smoker    . Smokeless tobacco: Never Used  . Alcohol Use: No  . Drug Use: No  . Sexual Activity: Yes   Other Topics Concern  . Not on file   Social History Narrative   Married 40+ yrs, two daughters, 3 grandchildren.Marland Kitchen   [place] prior to [place] since [place].   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   No T/A/Ds.   Exercise: Zumba weekly.    Outpatient Prescriptions Prior to Visit  Medication Sig Dispense Refill  . Calcium Carbonate-Vitamin D (CALTRATE 600+D) 600-400 MG-UNIT per chew tablet Chew 1 tablet by mouth daily.      . cetirizine (ZYRTEC) 10 MG tablet Take 10 mg by mouth daily.    . irbesartan-hydrochlorothiazide (AVALIDE) 150-12.5 MG tablet Take 1 tablet by mouth daily. 30 tablet 2  . Multiple Vitamin (MULTIVITAMIN) capsule Take 1 capsule by mouth daily.      . vitamin E 100 UNIT capsule Take 100 Units by mouth daily.      Marland Kitchen albuterol (PROVENTIL HFA) 108 (90 BASE) MCG/ACT inhaler Inhale 2 puffs into the lungs every 4 (four) hours as needed.      . beclomethasone (QVAR) 80 MCG/ACT inhaler Inhale 2  puffs into the lungs 2 (two) times daily. 1 Inhaler 2  . fluticasone (FLONASE) 50 MCG/ACT nasal spray Place 2 sprays into both nostrils daily. 16 g 6  . diclofenac (VOLTAREN) 75 MG EC tablet Take 75 mg by mouth 2 (two) times daily. Reported on 03/09/2015    . phentermine 37.5 MG capsule Take 1 capsule (37.5 mg total) by mouth every morning. (Patient not taking: Reported on 03/09/2015) 30 capsule 0  . VOLTAREN 1 % GEL Reported on 03/09/2015     No facility-administered medications prior to visit.    Allergies  Allergen Reactions  . Augmentin [Amoxicillin-Pot Clavulanate] Rash    ROS Review of Systems  Constitutional: Negative for fever, chills, appetite change and fatigue.  HENT: Negative for congestion, dental problem, ear pain and sore throat.   Eyes: Negative for discharge, redness and visual disturbance.  Respiratory: Negative for cough, chest tightness,  shortness of breath and wheezing.   Cardiovascular: Negative for chest pain, palpitations and leg swelling.  Gastrointestinal: Negative for nausea, vomiting, abdominal pain, diarrhea and blood in stool.  Genitourinary: Negative for dysuria, urgency, frequency, hematuria, flank pain and difficulty urinating.  Musculoskeletal: Positive for arthralgias (chronic, diffuse, mainly in mornings). Negative for myalgias, back pain, joint swelling and neck stiffness.  Skin: Negative for pallor and rash.  Neurological: Negative for dizziness, speech difficulty, weakness and headaches.  Hematological: Negative for adenopathy. Does not bruise/bleed easily.  Psychiatric/Behavioral: Positive for sleep disturbance (gets up to pee 3 times, sleep is broken, rest not good). Negative for confusion. The patient is not nervous/anxious.     PE; Blood pressure 103/73, pulse 84, temperature 97.8 F (36.6 C), temperature source Oral, resp. rate 16, height 5' 3.25" (1.607 m), weight 192 lb 8 oz (87.317 kg), SpO2 95 %.  Pt examined with Sharen Hones, CMA, as chaperone. Gen: Alert, well appearing.  Patient is oriented to person, place, time, and situation. AFFECT: pleasant, lucid thought and speech. ENT: Ears: EACs clear, normal epithelium.  TMs with good light reflex and landmarks bilaterally.  Eyes: no injection, icteris, swelling, or exudate.  EOMI, PERRLA. Nose: no drainage or turbinate edema/swelling.  No injection or focal lesion.  Mouth: lips without lesion/swelling.  Oral mucosa pink and moist.  Dentition intact and without obvious caries or gingival swelling.  Oropharynx without erythema, exudate, or swelling.  Neck: supple/nontender.  No LAD, mass, or TM.  Carotid pulses 2+ bilaterally, without bruits. CV: RRR, no m/r/g.   LUNGS: CTA bilat, nonlabored resps, good aeration in all lung fields. ABD: soft, NT, ND, BS normal.  No hepatospenomegaly or mass.  No bruits. EXT: no clubbing, cyanosis, or edema.   Musculoskeletal: no joint swelling, erythema, warmth, or tenderness.  ROM of all joints intact. Skin - no sores or suspicious lesions or rashes or color changes  Pertinent labs:  Green Results  Component Value Date   TSH 2.43 11/11/2013   Green Results  Component Value Date   WBC 5.7 11/11/2013   HGB 12.9 11/11/2013   HCT 39.5 11/11/2013   MCV 89.1 11/11/2013   PLT 246.0 11/11/2013   Green Results  Component Value Date   CREATININE 0.6 11/11/2013   BUN 15 11/11/2013   NA 140 11/11/2013   K 4.2 11/11/2013   CL 102 11/11/2013   CO2 31 11/11/2013   Green Results  Component Value Date   ALT 19 11/11/2013   AST 16 11/11/2013   ALKPHOS 70 11/11/2013   BILITOT 0.6 11/11/2013   Green Results  Component Value Date   CHOL 244* 11/11/2013   Green Results  Component Value Date   HDL 65.70 11/11/2013   Green Results  Component Value Date   LDLCALC 162* 11/11/2013   Green Results  Component Value Date   TRIG 81.0 11/11/2013   Green Results  Component Value Date   CHOLHDL 4 11/11/2013    ASSESSMENT AND PLAN:   1) Mild persistent asthma: not well controlled due to noncompliance with meds. Get back on QVAR 2 p bid EVERY DAY.  Also RF'd albuterol rescue inhaler. Sounds like the inhalers she does have at home were samples given long ago and are expired.  2) Obesity: pt to continue to work on diet/exercise.  She wants to continue the phentermine she had once been on.  I had never intended for her to get off this med, so I went ahead and rx'd this med again today. I printed rx's for phentermine 37.5 mg, 1 cap qd, #30 today for this month, Feb 2017, and Mar 2017.  Appropriate fill on/after date was noted on each rx.  3)Health maintenance exam: Reviewed age and gender appropriate health maintenance issues (prudent diet, regular exercise, health risks of tobacco and excessive alcohol, use of seatbelts, fire alarms in home, use of sunscreen).  Also reviewed age and gender appropriate health  screening as well as vaccine recommendations. Vaccines UTD. Colon ca screening: she is considering colonoscopy (scared of prep): wants to do iFOB/Hemoccult ICT for now. Breast and cerv ca screening UTD via her GYN. HP labs done today: fasting.  An After Visit Summary was printed and given to the patient.  FOLLOW UP:  Return in about 3 months (around 06/07/2015) for f/u wt loss med.

## 2015-03-09 NOTE — Progress Notes (Signed)
Pre visit review using our clinic review tool, if applicable. No additional management support is needed unless otherwise documented below in the visit note. 

## 2015-03-15 ENCOUNTER — Telehealth: Payer: Self-pay | Admitting: Family Medicine

## 2015-03-15 NOTE — Telephone Encounter (Signed)
Patient has developed a cough that is making her throat sore. Her eyes are watering & she feels terrible. Can an Rx be sent to [facility] [address]?

## 2015-03-15 NOTE — Telephone Encounter (Signed)
Spoke to pt she stated that her cough started 3 days ago and is productive. She stated that she does not have any fever but does have some sinus pain/pressure on the left side. She stated that she has not had any fever. I advised her that Dr. Anitra Lauth typically does not prescribe antibiotic without pts being seen. She voiced understanding and wanted to know if there was anything otc she could take. Please advise. Thanks.  FYI: Triage. Pt also mentioned that the other night she was laying in bed watching TV and had a really bad pain come from her left temple to her left eye. She stated that the white her eye turned red so she used some eye drops that her eye doctor prescribed her which cleared this up. She stated that she was not coughing when this happed. She stated that she is no longer having pain in her temple and that she does not have any visual changes in the left eye. Verbally reviewed this information with Dr. Raoul Pitch since Dr. Anitra Lauth had left for the day. Dr. Raoul Pitch stated that this was most likely a busted blood vessel and no urgent need to be seen.

## 2015-03-16 NOTE — Telephone Encounter (Signed)
Continue current OTc treatments. Color of mucous unfortunately does not specifically point to a bacterial or viral source of the infection. Viral upper resp infections ("colds") take 10 days typically to run their course.  Encourage patience, rest, fluids. If starts getting temp >101, shortness of breath, or sx's not improving at 10d of illness then come in.-thx

## 2015-03-16 NOTE — Telephone Encounter (Signed)
Left message for pt to call back  °

## 2015-03-16 NOTE — Telephone Encounter (Signed)
Get otc generic robitussin DM OR Mucinex DM and use as directed on the packaging for cough and congestion. Use otc generic saline nasal spray 2-3 times per day to irrigate/moisturize your nasal passages.   

## 2015-03-16 NOTE — Telephone Encounter (Signed)
Patient aware of recommendations.  

## 2015-03-16 NOTE — Telephone Encounter (Signed)
Patient advised.  She has already started both saline and mucinex.  She states that her mucus is now a dark brown color.  She also states she was unable to work today due to sickness.

## 2015-04-12 ENCOUNTER — Ambulatory Visit (INDEPENDENT_AMBULATORY_CARE_PROVIDER_SITE_OTHER): Payer: 59 | Admitting: Family Medicine

## 2015-04-12 ENCOUNTER — Encounter: Payer: Self-pay | Admitting: Family Medicine

## 2015-04-12 VITALS — BP 117/80 | HR 85 | Temp 98.6°F | Resp 20 | Wt 188.8 lb

## 2015-04-12 DIAGNOSIS — R05 Cough: Secondary | ICD-10-CM

## 2015-04-12 DIAGNOSIS — J0100 Acute maxillary sinusitis, unspecified: Secondary | ICD-10-CM

## 2015-04-12 DIAGNOSIS — Z299 Encounter for prophylactic measures, unspecified: Secondary | ICD-10-CM | POA: Diagnosis not present

## 2015-04-12 DIAGNOSIS — H109 Unspecified conjunctivitis: Secondary | ICD-10-CM | POA: Insufficient documentation

## 2015-04-12 DIAGNOSIS — R059 Cough, unspecified: Secondary | ICD-10-CM

## 2015-04-12 HISTORY — DX: Acute maxillary sinusitis, unspecified: J01.00

## 2015-04-12 MED ORDER — ZOSTER VACCINE LIVE 19400 UNT/0.65ML ~~LOC~~ SOLR: 0.6500 mL | Freq: Once | SUBCUTANEOUS | Status: DC

## 2015-04-12 MED ORDER — HYDROCODONE-HOMATROPINE 5-1.5 MG/5ML PO SYRP: 5.0000 mL | ORAL_SOLUTION | Freq: Three times a day (TID) | ORAL | Status: DC | PRN

## 2015-04-12 MED ORDER — BENZONATATE 100 MG PO CAPS: 200.0000 mg | ORAL_CAPSULE | Freq: Two times a day (BID) | ORAL | Status: DC | PRN

## 2015-04-12 MED ORDER — DOXYCYCLINE HYCLATE 100 MG PO TABS
100.0000 mg | ORAL_TABLET | Freq: Two times a day (BID) | ORAL | Status: DC
Start: 2015-04-12 — End: 2015-04-15

## 2015-04-12 MED ORDER — POLYMYXIN B-TRIMETHOPRIM 10000-0.1 UNIT/ML-% OP SOLN: 2.0000 [drp] | OPHTHALMIC | Status: DC

## 2015-04-12 NOTE — Patient Instructions (Signed)
Re-start Flonase Doxycyline antibiotic prescribed every 12 hours for 10 days. Hycodan cough syrup at night only (will make sleepy)-no refills, and tessalon Perles for cough in the day.  Continue nasal saline, humidifier, rest, hydrate,  Work excuse provided for today.    Warm compresses to eye 3-4 times a day, if worsening or not better by 2 days start eye drops.   Sinusitis, Adult Sinusitis is redness, soreness, and inflammation of the paranasal sinuses. Paranasal sinuses are air pockets within the bones of your face. They are located beneath your eyes, in the middle of your forehead, and above your eyes. In healthy paranasal sinuses, mucus is able to drain out, and air is able to circulate through them by way of your nose. However, when your paranasal sinuses are inflamed, mucus and air can become trapped. This can allow bacteria and other germs to grow and cause infection. Sinusitis can develop quickly and last only a short time (acute) or continue over a long period (chronic). Sinusitis that lasts for more than 12 weeks is considered chronic. CAUSES Causes of sinusitis include:  Allergies.  Structural abnormalities, such as displacement of the cartilage that separates your nostrils (deviated septum), which can decrease the air flow through your nose and sinuses and affect sinus drainage.  Functional abnormalities, such as when the small hairs (cilia) that line your sinuses and help remove mucus do not work properly or are not present. SIGNS AND SYMPTOMS Symptoms of acute and chronic sinusitis are the same. The primary symptoms are pain and pressure around the affected sinuses. Other symptoms include:  Upper toothache.  Earache.  Headache.  Bad breath.  Decreased sense of smell and taste.  A cough, which worsens when you are lying flat.  Fatigue.  Fever.  Thick drainage from your nose, which often is green and may contain pus (purulent).  Swelling and warmth over the affected  sinuses. DIAGNOSIS Your health care provider will perform a physical exam. During your exam, your health care provider may perform any of the following to help determine if you have acute sinusitis or chronic sinusitis:  Look in your nose for signs of abnormal growths in your nostrils (nasal polyps).  Tap over the affected sinus to check for signs of infection.  View the inside of your sinuses using an imaging device that has a light attached (endoscope). If your health care provider suspects that you have chronic sinusitis, one or more of the following tests may be recommended:  Allergy tests.  Nasal culture. A sample of mucus is taken from your nose, sent to a lab, and screened for bacteria.  Nasal cytology. A sample of mucus is taken from your nose and examined by your health care provider to determine if your sinusitis is related to an allergy. TREATMENT Most cases of acute sinusitis are related to a viral infection and will resolve on their own within 10 days. Sometimes, medicines are prescribed to help relieve symptoms of both acute and chronic sinusitis. These may include pain medicines, decongestants, nasal steroid sprays, or saline sprays. However, for sinusitis related to a bacterial infection, your health care provider will prescribe antibiotic medicines. These are medicines that will help kill the bacteria causing the infection. Rarely, sinusitis is caused by a fungal infection. In these cases, your health care provider will prescribe antifungal medicine. For some cases of chronic sinusitis, surgery is needed. Generally, these are cases in which sinusitis recurs more than 3 times per year, despite other treatments. HOME CARE INSTRUCTIONS  Drink plenty of water. Water helps thin the mucus so your sinuses can drain more easily.  Use a humidifier.  Inhale steam 3-4 times a day (for example, sit in the bathroom with the shower running).  Apply a warm, moist washcloth to your face  3-4 times a day, or as directed by your health care provider.  Use saline nasal sprays to help moisten and clean your sinuses.  Take medicines only as directed by your health care provider.  If you were prescribed either an antibiotic or antifungal medicine, finish it all even if you start to feel better. SEEK IMMEDIATE MEDICAL CARE IF:  You have increasing pain or severe headaches.  You have nausea, vomiting, or drowsiness.  You have swelling around your face.  You have vision problems.  You have a stiff neck.  You have difficulty breathing.   This information is not intended to replace advice given to you by your health care provider. Make sure you discuss any questions you have with your health care provider.   Document Released: 02/12/2005 Document Revised: 03/05/2014 Document Reviewed: 02/27/2011 Elsevier Interactive Patient Education [employer].

## 2015-04-12 NOTE — Progress Notes (Signed)
Patient ID: Laura Green, female   DOB: 24-Dec-1955, 60 y.o.   MRN: [id number]    Laura Green , 1955/11/23, 60 y.o., female MRN: [id number]  CC: Cough Subjective: Pt presents for an acute OV with complaints of cough of 3 weeks duration. Associated symptoms include fatigue, myalgia, headache, progression to productive cough. Pt feels symptoms are worse with bending over, causing pain in her sinus. She also noticed last night her right eye started to become red and matted shut.   Trying mucinex, nasal saline.  Pt with asthma history, on QVAR and albuterol. Recently restarted QVAR. She is using her albuterol a few times this week to help with wheezing. Recent prednisone taper through ortho for radicular neck pain. Prescribed flonase and zyrtec.  Flu and tdap UTD.   Allergies  Allergen Reactions  . Augmentin [Amoxicillin-Pot Clavulanate] Rash   Social History  Substance Use Topics  . Smoking status: Never Smoker   . Smokeless tobacco: Never Used  . Alcohol Use: No   Past Medical History  Diagnosis Date  . Perennial allergic rhinitis   . Depression   . Hypertension   . Hyperlipidemia     mildly elevated LDL: Framingham 10 yr risk 3.0% in 02/2015.  Marland Kitchen Concussion with no loss of consciousness 04/01/2008  . Asthma   . Obesity   . Polyarthralgia     Dr. Ouida Sills did rheum w/u which was neg approx 2012--all this got better with wt loss  . Breast mass, right 07/08/14    Two 7 mm masses, ultrasound impression "low suspicion of malignancy" bx showed benign fibrocystic breast tissue; repeat R mammo 01/12/15 NEG-- Repeat diagnostic R breast mammo recommended 6 mo   Past Surgical History  Procedure Laterality Date  . Cesarean section      x 2  . Rotator cuff repair  Approx 2009    Right  . Left foot surgery      2nd toe dislocation repair + bunion repair ([facility])   Family History  Problem Relation Age of Onset  . Arthritis Mother   . Colon cancer Mother   . COPD Father    . Hyperlipidemia Father      Medication List       This list is accurate as of: 04/12/15  8:18 AM.  Always use your most recent med list.               albuterol 108 (90 Base) MCG/ACT inhaler  Commonly known as:  PROVENTIL HFA  Inhale 2 puffs into the lungs every 4 (four) hours as needed.     beclomethasone 80 MCG/ACT inhaler  Commonly known as:  QVAR  Inhale 2 puffs into the lungs 2 (two) times daily.     CALTRATE 600+D 600-400 MG-UNIT chew tablet  Generic drug:  Calcium Carbonate-Vitamin D  Chew 1 tablet by mouth daily.     cetirizine 10 MG tablet  Commonly known as:  ZYRTEC  Take 10 mg by mouth daily.     cyclobenzaprine 10 MG tablet  Commonly known as:  FLEXERIL  Take 10 mg by mouth at bedtime as needed.     fluticasone 50 MCG/ACT nasal spray  Commonly known as:  FLONASE  Place 2 sprays into both nostrils daily.     irbesartan-hydrochlorothiazide 150-12.5 MG tablet  Commonly known as:  AVALIDE  Take 1 tablet by mouth daily.     multivitamin capsule  Take 1 capsule by mouth daily.     phentermine  37.5 MG capsule  Take 1 capsule (37.5 mg total) by mouth every morning.     vitamin E 100 UNIT capsule  Take 100 Units by mouth daily.       ROS: Negative, with the exception of above mentioned in HPI  Objective:  BP 117/80 mmHg  Pulse 85  Temp(Src) 98.6 F (37 C)  Resp 20  Wt 188 lb 12 oz (85.616 kg)  SpO2 99% Body mass index is 33.15 kg/(m^2). Gen: Afebrile. No acute distress. Nontoxic in appearance. Well developed, well nourished. Pleasant caucasian female.  HENT: AT. [place]. Bilateral TM visualized, right full, no erythema or bulging. MMM, no oral lesions. Bilateral nares with erythema and excoriations.  Throat without erythema or exudates. Cough present. No hoarseness. TTP bilateral max sinus.  Eyes:Pupils Equal Round Reactive to light, Extraocular movements intact,  Conjunctiva without redness, discharge or icterus. Neck/lymp/endocrine: Supple,  submandibular lt, and right ant cervical  lymphadenopathy CV: RRR  Chest: CTAB, no wheeze or crackles. Good air movement, normal resp effort.  Abd: Soft. NTND. BS present  Skin: No rashes, purpura or petechiae.  Neuro: Normal gait. PERLA. EOMi. Alert. Oriented x3   Assessment/Plan: [name] is a 60 y.o. female present for acute OV  1. Acute maxillary sinusitis, recurrence not specified - Re-start Flonase, Doxycyline  - Continue nasal saline, humidifier, rest, hydrate,  - Hycodan QHS, tessalon perles in the day - Work excuse provided for today.   - doxycycline (VIBRA-TABS) 100 MG tablet; Take 1 tablet (100 mg total) by mouth 2 (two) times daily.  Dispense: 20 tablet; Refill: 0  2. Conjunctivitis of right eye - use warm compresses 3-4 x a day. Fill eye drops if worsening in 2 days.  - trimethoprim-polymyxin b (POLYTRIM) ophthalmic solution; Place 2 drops into the right eye every 4 (four) hours.  Dispense: 10 mL; Refill: 0  3. Cough - HYDROcodone-homatropine (HYCODAN) 5-1.5 MG/5ML syrup; Take 5 mLs by mouth every 8 (eight) hours as needed for cough.  Dispense: 120 mL; Refill: 0 - benzonatate (TESSALON) 100 MG capsule; Take 2 capsules (200 mg total) by mouth 2 (two) times daily as needed for cough.  Dispense: 20 capsule; Refill: 0  4. Preventive measure - pt voiced desire for shingles shot. Discussed protocol for shot and possibility of a co-pay at pharmacy.  - zoster vaccine live, PF, (ZOSTAVAX) 02725 UNT/0.65ML injection; Inject 19,400 Units into the skin once.  Dispense: 1 each; Refill: 0    Kharlie Bring, DO  [place]

## 2015-04-14 ENCOUNTER — Telehealth: Payer: Self-pay | Admitting: Family Medicine

## 2015-04-14 NOTE — Telephone Encounter (Signed)
[facility] - Client [place] Patient Name: Laura Green DOB: 21-Apr-1955 Initial Comment Caller stated she was given an antibiotic and it is causing stomach pains. Nurse Assessment Nurse: Martyn Ehrich, RN, [place] Date/Time (Eastern Time): 04/14/2015 3:51:56 PM Confirm and document reason for call. If symptomatic, describe symptoms. You must click the next button to save text entered. ---Pt is on antx ordered Tues am by MD above. No fever and is improving. She feels the medication is causing severe pain in stomach. She has been taking it on empty stomach. She is wondering if she should take it with food. - doxycycline. No pain now - just hurts for about 30 min after she takes. No diarrhea. No blood in stool and no vomiting Has the patient traveled out of the country within the last 30 days? ---No Does the patient have any new or worsening symptoms? ---Yes Will a triage be completed? ---YesRelated visit to physician within the last 2 weeks? ---Yes Does the PT have any chronic conditions? (i.e. diabetes, asthma, etc.) ---No Is this a behavioral health or substance abuse call? ---No Guidelines Guideline Title Affirmed Question Affirmed Notes Abdominal Pain - Upper [1] Pain lasts > 10 minutes AND [2] age > 87 Final Disposition User Go to ED Now Martyn Ehrich, RN, [place] Comments [email or web address]://[email or web address].com/sfx/doxycycline-side-effects.html - referenced to tell her it could be related but cant be sure - told her office will be notified and she may hear back today or tom from office tried to reach backline and no answer also told her that she can take a snack before the doxycycline and see if that helps - referenced nurses own knowledge Spoke with Diane at the office and she said she will give the epicreport of conversation to the nurse CALLER DECLINED TO GO TO ER Referrals [place] REFUSED  Call Id: [id number]

## 2015-04-15 ENCOUNTER — Telehealth: Payer: Self-pay | Admitting: *Deleted

## 2015-04-15 MED ORDER — CEFDINIR 300 MG PO CAPS: 300.0000 mg | ORAL_CAPSULE | Freq: Two times a day (BID) | ORAL | Status: DC

## 2015-04-15 NOTE — Telephone Encounter (Signed)
I have called in a different medication for her. She has had a similar medication to this one in the past without reaction. She is to discontinue the doxy.

## 2015-04-15 NOTE — Telephone Encounter (Signed)
Left message for patient with information regarding new Rx and to discontinue doxycycline

## 2015-05-30 ENCOUNTER — Other Ambulatory Visit: Payer: Self-pay | Admitting: *Deleted

## 2015-05-30 DIAGNOSIS — I10 Essential (primary) hypertension: Secondary | ICD-10-CM

## 2015-05-30 MED ORDER — IRBESARTAN-HYDROCHLOROTHIAZIDE 150-12.5 MG PO TABS: 1.0000 | ORAL_TABLET | Freq: Every day | ORAL | Status: DC

## 2015-05-30 NOTE — Telephone Encounter (Signed)
RF request for irbesartan/hctz LOV: 03/09/15 Next ov: 06/13/15 Last written: 02/10/15 #30 w/ 2RF

## 2015-06-10 ENCOUNTER — Ambulatory Visit: Payer: 59 | Admitting: Family Medicine

## 2015-06-13 ENCOUNTER — Ambulatory Visit: Payer: 59 | Admitting: Family Medicine

## 2015-07-19 HISTORY — PX: OTHER SURGICAL HISTORY: SHX169

## 2015-07-22 ENCOUNTER — Encounter: Payer: Self-pay | Admitting: Family Medicine

## 2015-07-27 ENCOUNTER — Telehealth: Payer: Self-pay | Admitting: Family Medicine

## 2015-07-27 ENCOUNTER — Encounter: Payer: Self-pay | Admitting: Family Medicine

## 2015-07-27 NOTE — Telephone Encounter (Signed)
Pls notify pt that her bone density scan showed that she had some mild thinning of her bones but NOT osteoporosis.  Continue calcium and vit D supplements as she is currently doing. We'll plan on repeat bone density test in 2 yrs.-thx

## 2015-07-28 ENCOUNTER — Telehealth: Payer: Self-pay | Admitting: *Deleted

## 2015-07-28 MED ORDER — PHENTERMINE HCL 37.5 MG PO TABS: 37.5000 mg | ORAL_TABLET | Freq: Every day | ORAL | Status: DC

## 2015-07-28 NOTE — Telephone Encounter (Signed)
Pt called requesting refill.  RF request for phentermine LOV: 03/09/15 Next ov: None Last written: 03/09/15 #30 w/ 0RF  [facility]  She stated that she would like to have Rx changed to tablets instead of capsules because the tablets are cheaper. Please advise. Thanks.

## 2015-07-28 NOTE — Telephone Encounter (Signed)
Phentermine 37.5mg  tabs printed.

## 2015-07-28 NOTE — Telephone Encounter (Signed)
Pt advised and voiced understanding.   

## 2015-07-28 NOTE — Telephone Encounter (Signed)
Rx faxed.   Pt advised and voiced understanding.   

## 2015-08-17 ENCOUNTER — Encounter: Payer: Self-pay | Admitting: Family Medicine

## 2015-09-28 ENCOUNTER — Other Ambulatory Visit: Payer: Self-pay | Admitting: Family Medicine

## 2015-09-29 NOTE — Telephone Encounter (Signed)
Rx faxed

## 2015-12-04 ENCOUNTER — Other Ambulatory Visit: Payer: Self-pay | Admitting: Family Medicine

## 2015-12-05 NOTE — Telephone Encounter (Signed)
Pt requesting RF of phentermine.  Last OV 04/12/15. No upcoming appt. Last RF 09/28/15 0RF Please advise

## 2015-12-05 NOTE — Telephone Encounter (Signed)
Rx faxed to [facility] [address]

## 2016-01-26 DIAGNOSIS — M19071 Primary osteoarthritis, right ankle and foot: Secondary | ICD-10-CM | POA: Diagnosis not present

## 2016-02-01 DIAGNOSIS — Z961 Presence of intraocular lens: Secondary | ICD-10-CM | POA: Diagnosis not present

## 2016-02-01 DIAGNOSIS — H26493 Other secondary cataract, bilateral: Secondary | ICD-10-CM | POA: Diagnosis not present

## 2016-02-01 DIAGNOSIS — H1851 Endothelial corneal dystrophy: Secondary | ICD-10-CM | POA: Diagnosis not present

## 2016-02-16 ENCOUNTER — Other Ambulatory Visit: Payer: Self-pay | Admitting: Family Medicine

## 2016-02-16 NOTE — Telephone Encounter (Signed)
RF request for phentermine LOV: 03/09/15 Next ov: None Last written: 12/05/15 #30 w/ [id number]  Please advise. Thanks.

## 2016-02-16 NOTE — Telephone Encounter (Signed)
Rx faxed. Pt advised and voiced understanding.  Apt made for 03/14/16 at 9:00am, pt was advised to come in fasting.

## 2016-02-16 NOTE — Telephone Encounter (Signed)
Phentermine rx printed. Remind pt she needs to come in sometime in Feb or March for her CPE.-thx

## 2016-03-14 ENCOUNTER — Encounter: Payer: 59 | Admitting: Family Medicine

## 2016-03-26 ENCOUNTER — Other Ambulatory Visit: Payer: Self-pay | Admitting: *Deleted

## 2016-03-26 ENCOUNTER — Encounter: Payer: Self-pay | Admitting: Family Medicine

## 2016-03-26 ENCOUNTER — Ambulatory Visit (INDEPENDENT_AMBULATORY_CARE_PROVIDER_SITE_OTHER): Payer: BLUE CROSS/BLUE SHIELD | Admitting: Family Medicine

## 2016-03-26 VITALS — BP 130/86 | HR 88 | Temp 98.2°F | Resp 16 | Ht 63.25 in | Wt 189.0 lb

## 2016-03-26 DIAGNOSIS — Z0000 Encounter for general adult medical examination without abnormal findings: Secondary | ICD-10-CM

## 2016-03-26 LAB — COMPREHENSIVE METABOLIC PANEL
ALBUMIN: 3.6 g/dL (ref 3.5–5.2)
ALK PHOS: 69 U/L (ref 39–117)
ALT: 18 U/L (ref 0–35)
AST: 15 U/L (ref 0–37)
BILIRUBIN TOTAL: 0.9 mg/dL (ref 0.2–1.2)
BUN: 16 mg/dL (ref 6–23)
CALCIUM: 9.3 mg/dL (ref 8.4–10.5)
CO2: 33 meq/L — AB (ref 19–32)
CREATININE: 0.66 mg/dL (ref 0.40–1.20)
Chloride: 102 mEq/L (ref 96–112)
GFR: 96.65 mL/min (ref >60.00)
Glucose, Bld: 84 mg/dL (ref 70–99)
Potassium: 4 mEq/L (ref 3.5–5.1)
Sodium: 141 mEq/L (ref 135–145)
TOTAL PROTEIN: 7 g/dL (ref 6.0–8.3)

## 2016-03-26 LAB — CBC WITH DIFFERENTIAL/PLATELET
BASOS ABS: 0.1 10*3/uL (ref 0.0–0.1)
Basophils Relative: 1.2 % (ref 0.0–3.0)
EOS ABS: 0.3 10*3/uL (ref 0.0–0.7)
Eosinophils Relative: 4.6 % (ref 0.0–5.0)
HCT: 40.6 % (ref 36.0–46.0)
Hemoglobin: 13.5 g/dL (ref 12.0–15.0)
LYMPHS ABS: 1.4 10*3/uL (ref 0.7–4.0)
Lymphocytes Relative: 25 % (ref 12.0–46.0)
MCHC: 33.2 g/dL (ref 30.0–36.0)
MCV: 88.1 fl (ref 78.0–100.0)
MONOS PCT: 8.4 % (ref 3.0–12.0)
Monocytes Absolute: 0.5 10*3/uL (ref 0.1–1.0)
NEUTROS ABS: 3.5 10*3/uL (ref 1.4–7.7)
NEUTROS PCT: 60.8 % (ref 43.0–77.0)
PLATELETS: 293 10*3/uL (ref 150.0–400.0)
RBC: 4.61 Mil/uL (ref 3.87–5.11)
RDW: 12.9 % (ref 11.5–15.5)
WBC: 5.7 10*3/uL (ref 4.0–10.5)

## 2016-03-26 LAB — LIPID PANEL
CHOL/HDL RATIO: 4
Cholesterol: 253 mg/dL — ABNORMAL HIGH (ref 0–200)
HDL: 69.7 mg/dL (ref >39.00)
LDL Cholesterol: 169 mg/dL — ABNORMAL HIGH (ref 0–99)
NonHDL: 183.05
TRIGLYCERIDES: 72 mg/dL (ref 0.0–149.0)
VLDL: 14.4 mg/dL (ref 0.0–40.0)

## 2016-03-26 LAB — TSH: TSH: 2.37 u[IU]/mL (ref 0.35–4.50)

## 2016-03-26 MED ORDER — PHENTERMINE HCL 37.5 MG PO TABS: ORAL_TABLET | ORAL | 0 refills | Status: DC

## 2016-03-26 MED ORDER — BECLOMETHASONE DIPROPIONATE 80 MCG/ACT IN AERS: 2.0000 | INHALATION_SPRAY | Freq: Two times a day (BID) | RESPIRATORY_TRACT | 11 refills | Status: DC

## 2016-03-26 NOTE — Telephone Encounter (Signed)
Fax from [place].  RF request for QVAR LOV: 03/26/16 Next ov: None Last written: 03/09/15 #8.7g w/ 12RF

## 2016-03-26 NOTE — Progress Notes (Signed)
Office Note 03/26/2016  CC:  Chief Complaint  Patient presents with  . Annual Exam    Pt is fasting.     HPI:  Laura Green is a 61 y.o. White female who is here for annual health maintenance exam. Mammogram UTD via GYN.  Pap-most recent was last year (Dr. Brand Males f/u 04/05/16). She wants to do cologuard for colon cancer screening. Dental: biannual. Eye exam: UTD (cataract surgery 06/2015). Does regular exercise: Zumba.  Past Medical History:  Diagnosis Date  . Asthma   . Breast mass, right 07/08/14   Two 7 mm masses, ultrasound impression "low suspicion of malignancy" bx showed benign fibrocystic breast tissue; repeat R mammo 01/12/15 NEG-- Repeat diagnostic R breast mammo recommended 6 mo  . Concussion with no loss of consciousness 04/01/2008  . Depression   . Hyperlipidemia    mildly elevated LDL: Framingham 10 yr risk 3.0% in 02/2015.  Marland Kitchen Hypertension   . Obesity   . Perennial allergic rhinitis   . Polyarthralgia    Dr. Ouida Sills did rheum w/u which was neg approx 2012--all this got better with wt loss    Past Surgical History:  Procedure Laterality Date  . CESAREAN SECTION     x 2  . 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  Plan repeat 2 yrs.  . left foot surgery     2nd toe dislocation repair + bunion repair ([facility])  . ROTATOR CUFF REPAIR  Approx 2009   Right    Family History  Problem Relation Age of Onset  . Arthritis Mother   . Colon cancer Mother   . COPD Father   . Hyperlipidemia Father     Social History   Social History  . Marital status: Married    Spouse name: N/A  . Number of children: N/A  . Years of education: N/A   Occupational History  . Not on file.   Social History Main Topics  . Smoking status: Never Smoker  . Smokeless tobacco: Never Used  . Alcohol use No  . Drug use: No  . Sexual activity: Yes   Other Topics Concern  . Not on file   Social History Narrative   Married 40+ yrs, two daughters, 3  grandchildren.Marland Kitchen   [place] prior to [place]-- since 1990s.   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   No T/A/Ds.   Exercise: Zumba weekly.    Outpatient Medications Prior to Visit  Medication Sig Dispense Refill  . albuterol (PROVENTIL HFA) 108 (90 Base) MCG/ACT inhaler Inhale 2 puffs into the lungs every 4 (four) hours as needed. 1 Inhaler 1  . beclomethasone (QVAR) 80 MCG/ACT inhaler Inhale 2 puffs into the lungs 2 (two) times daily. 1 Inhaler 12  . Calcium Carbonate-Vitamin D (CALTRATE 600+D) 600-400 MG-UNIT per chew tablet Chew 1 tablet by mouth daily.      . cetirizine (ZYRTEC) 10 MG tablet Take 10 mg by mouth daily.    . cyclobenzaprine (FLEXERIL) 10 MG tablet Take 10 mg by mouth at bedtime as needed.  1  . irbesartan-hydrochlorothiazide (AVALIDE) 150-12.5 MG tablet Take 1 tablet by mouth daily. 30 tablet 11  . Multiple Vitamin (MULTIVITAMIN) capsule Take 1 capsule by mouth daily.      Marland Kitchen trimethoprim-polymyxin b (POLYTRIM) ophthalmic solution Place 2 drops into the right eye every 4 (four) hours. 10 mL 0  . vitamin E 100 UNIT capsule Take 100 Units by mouth daily.      Marland Kitchen  phentermine (ADIPEX-P) 37.5 MG tablet TAKE 1 TABLET BY MOUTH EVERY DAY BEFORE BREAKFAST 30 tablet 0  . fluticasone (FLONASE) 50 MCG/ACT nasal spray Place 2 sprays into both nostrils daily. 16 g 6  . benzonatate (TESSALON) 100 MG capsule Take 2 capsules (200 mg total) by mouth 2 (two) times daily as needed for cough. (Patient not taking: Reported on 03/26/2016) 20 capsule 0  . cefdinir (OMNICEF) 300 MG capsule Take 1 capsule (300 mg total) by mouth 2 (two) times daily. (Patient not taking: Reported on 03/26/2016) 14 capsule 0  . HYDROcodone-homatropine (HYCODAN) 5-1.5 MG/5ML syrup Take 5 mLs by mouth every 8 (eight) hours as needed for cough. (Patient not taking: Reported on 03/26/2016) 120 mL 0  . zoster vaccine live, PF, (ZOSTAVAX) 60454 UNT/0.65ML injection Inject 19,400 Units into the skin once.  (Patient not taking: Reported on 03/26/2016) 1 each 0   No facility-administered medications prior to visit.     Allergies  Allergen Reactions  . Augmentin [Amoxicillin-Pot Clavulanate] Rash    ROS Review of Systems  Constitutional: Negative for appetite change, chills, fatigue and fever.  HENT: Negative for congestion, dental problem, ear pain and sore throat.   Eyes: Negative for discharge, redness and visual disturbance.  Respiratory: Negative for cough, chest tightness, shortness of breath and wheezing.   Cardiovascular: Negative for chest pain, palpitations and leg swelling.  Gastrointestinal: Negative for abdominal pain, blood in stool, diarrhea, nausea and vomiting.  Genitourinary: Negative for difficulty urinating, dysuria, flank pain, frequency, hematuria and urgency.  Musculoskeletal: Negative for arthralgias, back pain, joint swelling, myalgias and neck stiffness.  Skin: Negative for pallor and rash.  Neurological: Negative for dizziness, speech difficulty, weakness and headaches.  Hematological: Negative for adenopathy. Does not bruise/bleed easily.  Psychiatric/Behavioral: Negative for confusion and sleep disturbance. The patient is not nervous/anxious.     PE; Blood pressure 130/86, pulse 88, temperature 98.2 F (36.8 C), temperature source Oral, resp. rate 16, height 5' 3.25" (1.607 m), weight 189 lb (85.7 kg), SpO2 94 %. Gen: Alert, well appearing.  Patient is oriented to person, place, time, and situation. AFFECT: pleasant, lucid thought and speech. ENT: Ears: EACs clear, normal epithelium.  TMs with good light reflex and landmarks bilaterally.  Eyes: no injection, icteris, swelling, or exudate.  EOMI, PERRLA. Nose: no drainage or turbinate edema/swelling.  No injection or focal lesion.  Mouth: lips without lesion/swelling.  Oral mucosa pink and moist.  Dentition intact and without obvious caries or gingival swelling.  Oropharynx without erythema, exudate, or swelling.   Neck: supple/nontender.  No LAD, mass, or TM.  Carotid pulses 2+ bilaterally, without bruits. CV: RRR, no m/r/g.   LUNGS: CTA bilat, nonlabored resps, good aeration in all lung fields. ABD: soft, NT, ND, BS normal.  No hepatospenomegaly or mass.  No bruits. EXT: no clubbing, cyanosis, or edema.  Musculoskeletal: no joint swelling, erythema, warmth, or tenderness.  ROM of all joints intact. Skin - no sores or suspicious lesions or rashes or color changes   Pertinent labs:  Lab Results  Component Value Date   TSH 1.87 03/09/2015   Lab Results  Component Value Date   WBC 5.0 03/09/2015   HGB 13.4 03/09/2015   HCT 41.9 03/09/2015   MCV 89.8 03/09/2015   PLT 292.0 03/09/2015   Lab Results  Component Value Date   CREATININE 0.64 03/09/2015   BUN 16 03/09/2015   NA 143 03/09/2015   K 4.0 03/09/2015   CL 103 03/09/2015   CO2 34 (  H) 03/09/2015   Lab Results  Component Value Date   ALT 19 03/09/2015   AST 15 03/09/2015   ALKPHOS 65 03/09/2015   BILITOT 0.7 03/09/2015   Lab Results  Component Value Date   CHOL 245 (H) 03/09/2015   Lab Results  Component Value Date   HDL 65.20 03/09/2015   Lab Results  Component Value Date   LDLCALC 162 (H) 03/09/2015   Lab Results  Component Value Date   TRIG 92.0 03/09/2015   Lab Results  Component Value Date   CHOLHDL 4 03/09/2015    ASSESSMENT AND PLAN:   Health maintenance exam: Reviewed age and gender appropriate health maintenance issues (prudent diet, regular exercise, health risks of tobacco and excessive alcohol, use of seatbelts, fire alarms in home, use of sunscreen).  Also reviewed age and gender appropriate health screening as well as vaccine recommendations. Zostavax rx given to pt today.  Flu vaccine is UTD. Cervical ca and breast ca screening UTD via her GYN MD. Colon cancer screening: the last 2 years she has not turned in the iFOB we have given her. She wants to try doing cologuard this year: ordered  today. Fasting HP labs drawn today. She wants to continue with her diet pills.  I printed rx's for phentermine 37.5mg , 1 po qd, #30 today for this month, Feb 2018, and Mar 2018.  Appropriate fill on/after date was noted on each rx.  An After Visit Summary was printed and given to the patient.  FOLLOW UP:  Return in about 6 months (around 09/23/2016) for routine chronic illness f/u.  Signed:  Crissie Sickles, MD           03/26/2016

## 2016-03-26 NOTE — Progress Notes (Signed)
Pre visit review using our clinic review tool, if applicable. No additional management support is needed unless otherwise documented below in the visit note. 

## 2016-03-29 DIAGNOSIS — Z1211 Encounter for screening for malignant neoplasm of colon: Secondary | ICD-10-CM

## 2016-03-29 HISTORY — DX: Encounter for screening for malignant neoplasm of colon: Z12.11

## 2016-04-08 DIAGNOSIS — Z1211 Encounter for screening for malignant neoplasm of colon: Secondary | ICD-10-CM | POA: Diagnosis not present

## 2016-04-08 DIAGNOSIS — Z1212 Encounter for screening for malignant neoplasm of rectum: Secondary | ICD-10-CM | POA: Diagnosis not present

## 2016-04-13 LAB — HM COLONOSCOPY

## 2016-04-16 ENCOUNTER — Encounter: Payer: Self-pay | Admitting: Family Medicine

## 2016-04-16 ENCOUNTER — Telehealth: Payer: Self-pay | Admitting: Family Medicine

## 2016-04-16 NOTE — Telephone Encounter (Signed)
Pls notify pt that her cologuard screening test was NEGATIVE.  This is good. Repeat 3 yrs.-thx

## 2016-04-17 NOTE — Telephone Encounter (Signed)
Pt advised and voiced understanding.   

## 2016-04-25 ENCOUNTER — Ambulatory Visit (INDEPENDENT_AMBULATORY_CARE_PROVIDER_SITE_OTHER): Payer: BLUE CROSS/BLUE SHIELD | Admitting: Family Medicine

## 2016-04-25 ENCOUNTER — Encounter: Payer: Self-pay | Admitting: Family Medicine

## 2016-04-25 VITALS — BP 120/80 | HR 77 | Temp 98.6°F | Resp 16 | Ht 63.25 in | Wt 190.5 lb

## 2016-04-25 DIAGNOSIS — J069 Acute upper respiratory infection, unspecified: Secondary | ICD-10-CM | POA: Diagnosis not present

## 2016-04-25 DIAGNOSIS — B9789 Other viral agents as the cause of diseases classified elsewhere: Secondary | ICD-10-CM

## 2016-04-25 DIAGNOSIS — J040 Acute laryngitis: Secondary | ICD-10-CM | POA: Diagnosis not present

## 2016-04-25 NOTE — Progress Notes (Signed)
OFFICE VISIT  04/25/2016   CC:  Chief Complaint  Patient presents with  . URI    x 3 days   HPI:    Patient is a 61 y.o. Caucasian female who presents for respiratory complaints. Onset 3 days ago with decreased energy, then got nasal/sinus congestion, cough, and some ST.  Lost her voice yesterday and started feeling malaise today.  Throat hurts on L side, L ear hurts.  No fever.  HA. No SOB, no chest tightness.   Past Medical History:  Diagnosis Date  . Asthma   . Borderline hyperlipidemia 2017/2018   Her 10 yr Framingham risk = 5% in 2018  . Breast mass, right 07/08/14   Two 7 mm masses, ultrasound impression "low suspicion of malignancy" bx showed benign fibrocystic breast tissue; repeat R mammo 01/12/15 NEG-- Repeat diagnostic R breast mammo recommended 6 mo  . Colon cancer screening 03/2016   Cologuard negative.  Repeat 03/2019.  Marland Kitchen Concussion with no loss of consciousness 04/01/2008  . Depression   . Hypertension   . Obesity   . Perennial allergic rhinitis   . Polyarthralgia    Dr. Ouida Sills did rheum w/u which was neg approx 2012--all this got better with wt loss    Past Surgical History:  Procedure Laterality Date  . CESAREAN SECTION     x 2  . 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  Plan repeat 2 yrs.  . left foot surgery     2nd toe dislocation repair + bunion repair ([facility])  . ROTATOR CUFF REPAIR  Approx 2009   Right    Outpatient Medications Prior to Visit  Medication Sig Dispense Refill  . albuterol (PROVENTIL HFA) 108 (90 Base) MCG/ACT inhaler Inhale 2 puffs into the lungs every 4 (four) hours as needed. 1 Inhaler 1  . beclomethasone (QVAR) 80 MCG/ACT inhaler Inhale 2 puffs into the lungs 2 (two) times daily. 1 Inhaler 11  . Calcium Carbonate-Vitamin D (CALTRATE 600+D) 600-400 MG-UNIT per chew tablet Chew 1 tablet by mouth daily.      . cetirizine (ZYRTEC) 10 MG tablet Take 10 mg by mouth daily.    . cyclobenzaprine (FLEXERIL) 10 MG tablet Take  10 mg by mouth at bedtime as needed.  1  . irbesartan-hydrochlorothiazide (AVALIDE) 150-12.5 MG tablet Take 1 tablet by mouth daily. 30 tablet 11  . Multiple Vitamin (MULTIVITAMIN) capsule Take 1 capsule by mouth daily.      . phentermine (ADIPEX-P) 37.5 MG tablet TAKE 1 TABLET BY MOUTH EVERY DAY BEFORE BREAKFAST 30 tablet 0  . trimethoprim-polymyxin b (POLYTRIM) ophthalmic solution Place 2 drops into the right eye every 4 (four) hours. 10 mL 0  . vitamin E 100 UNIT capsule Take 100 Units by mouth daily.      . fluticasone (FLONASE) 50 MCG/ACT nasal spray Place 2 sprays into both nostrils daily. 16 g 6   No facility-administered medications prior to visit.     Allergies  Allergen Reactions  . Augmentin [Amoxicillin-Pot Clavulanate] Rash    ROS As per HPI  PE: Blood pressure 120/80, pulse 77, temperature 98.6 F (37 C), temperature source Temporal, resp. rate 16, height 5' 3.25" (1.607 m), weight 190 lb 8 oz (86.4 kg), SpO2 99 %. VS: noted--normal. Gen: alert, NAD, NONTOXIC APPEARING. HEENT: eyes without injection, drainage, or swelling.  Ears: EACs clear, TMs with normal light reflex and landmarks.  Nose: Clear rhinorrhea, with some dried, crusty exudate adherent to mildly injected mucosa.  No purulent d/c.  No paranasal sinus TTP.  No facial swelling.  Throat and mouth without focal lesion.  No pharyngial swelling, erythema, or exudate.   Neck: supple, no LAD.   LUNGS: CTA bilat, nonlabored resps.   CV: RRR, no m/r/g. EXT: no c/c/e SKIN: no rash   LABS:    Chemistry      Component Value Date/Time   NA 141 03/26/2016 0932   K 4.0 03/26/2016 0932   CL 102 03/26/2016 0932   CO2 33 (H) 03/26/2016 0932   BUN 16 03/26/2016 0932   CREATININE 0.66 03/26/2016 0932      Component Value Date/Time   CALCIUM 9.3 03/26/2016 0932   ALKPHOS 69 03/26/2016 0932   AST 15 03/26/2016 0932   ALT 18 03/26/2016 0932   BILITOT 0.9 03/26/2016 0932      IMPRESSION AND PLAN:  Viral URI  with cough and laryngitis. No sign of bacterial infection or RAD. Symptomatic care discussed. Signs/symptoms to call or return for were reviewed and pt expressed understanding.  An After Visit Summary was printed and given to the patient.  FOLLOW UP: Return if symptoms worsen or fail to improve.  Signed:  Crissie Sickles, MD           04/25/2016

## 2016-04-25 NOTE — Progress Notes (Signed)
Pre visit review using our clinic review tool, if applicable. No additional management support is needed unless otherwise documented below in the visit note. 

## 2016-04-27 ENCOUNTER — Telehealth: Payer: Self-pay | Admitting: Family Medicine

## 2016-04-27 ENCOUNTER — Other Ambulatory Visit: Payer: Self-pay | Admitting: Family Medicine

## 2016-04-27 MED ORDER — BENZONATATE 200 MG PO CAPS: 200.0000 mg | ORAL_CAPSULE | Freq: Three times a day (TID) | ORAL | 0 refills | Status: DC | PRN

## 2016-04-27 NOTE — Telephone Encounter (Signed)
OK, will send in rx for tessalon perls.-thx

## 2016-04-27 NOTE — Telephone Encounter (Signed)
Please advise. Thanks.  

## 2016-04-27 NOTE — Telephone Encounter (Signed)
Pt advised and voiced understanding.   

## 2016-04-27 NOTE — Telephone Encounter (Signed)
Patient is calling because she would like a cough suppressant. She is coughing a lot and having a hard time sleeping.  Thank you,  -LL

## 2016-05-07 ENCOUNTER — Other Ambulatory Visit: Payer: Self-pay | Admitting: Family Medicine

## 2016-05-07 MED ORDER — BECLOMETHASONE DIPROP HFA 80 MCG/ACT IN AERB: 2.0000 | INHALATION_SPRAY | Freq: Two times a day (BID) | RESPIRATORY_TRACT | 11 refills | Status: DC

## 2016-05-23 ENCOUNTER — Other Ambulatory Visit: Payer: Self-pay | Admitting: Family Medicine

## 2016-05-23 DIAGNOSIS — I10 Essential (primary) hypertension: Secondary | ICD-10-CM

## 2016-08-17 ENCOUNTER — Other Ambulatory Visit: Payer: Self-pay | Admitting: Family Medicine

## 2016-08-17 NOTE — Telephone Encounter (Signed)
Patient returned call.  She has an appointment scheduled 09/20/16.

## 2016-08-17 NOTE — Telephone Encounter (Signed)
Detailed message left on patient's voicemail.

## 2016-08-17 NOTE — Telephone Encounter (Signed)
Will fill for 30 d supply.  Pt needs routine f/u office visit in the next 1 month.-thx

## 2016-09-13 DIAGNOSIS — Z1231 Encounter for screening mammogram for malignant neoplasm of breast: Secondary | ICD-10-CM | POA: Diagnosis not present

## 2016-09-13 LAB — HM MAMMOGRAPHY

## 2016-09-14 ENCOUNTER — Encounter: Payer: Self-pay | Admitting: Family Medicine

## 2016-09-20 ENCOUNTER — Ambulatory Visit (INDEPENDENT_AMBULATORY_CARE_PROVIDER_SITE_OTHER): Payer: BLUE CROSS/BLUE SHIELD | Admitting: Family Medicine

## 2016-09-20 ENCOUNTER — Encounter: Payer: Self-pay | Admitting: Family Medicine

## 2016-09-20 VITALS — BP 116/79 | HR 96 | Temp 98.2°F | Resp 16 | Ht 63.25 in | Wt 184.8 lb

## 2016-09-20 DIAGNOSIS — J4530 Mild persistent asthma, uncomplicated: Secondary | ICD-10-CM

## 2016-09-20 DIAGNOSIS — I10 Essential (primary) hypertension: Secondary | ICD-10-CM

## 2016-09-20 LAB — BASIC METABOLIC PANEL
BUN: 16 mg/dL (ref 6–23)
CHLORIDE: 103 meq/L (ref 96–112)
CO2: 32 mEq/L (ref 19–32)
Calcium: 9.4 mg/dL (ref 8.4–10.5)
Creatinine, Ser: 0.74 mg/dL (ref 0.40–1.20)
GFR: 84.56 mL/min (ref >60.00)
GLUCOSE: 94 mg/dL (ref 70–99)
POTASSIUM: 4 meq/L (ref 3.5–5.1)
SODIUM: 140 meq/L (ref 135–145)

## 2016-09-20 MED ORDER — PHENTERMINE HCL 37.5 MG PO TABS: ORAL_TABLET | ORAL | 0 refills | Status: DC

## 2016-09-20 NOTE — Progress Notes (Signed)
OFFICE VISIT  09/20/2016   CC:  Chief Complaint  Patient presents with  . Follow-up    RCI, pt is fasting.   HPI:    Patient is a 61 y.o. Caucasian female who presents for 6 mo f/u HTN, obesity (on phentermine), and mild persistent asthma.   HOME BP's: consistently <125/80.   Obesity: cutting carbs, still doing Zumba.   Breathing: no wheezing or cough. Not having to use rescue inhaler in several months.   ROS: Pains in joints "all the time".  No HA, dizziness, CP, SOB, or palpitations or tremor.    Past Medical History:  Diagnosis Date  . Asthma   . Borderline hyperlipidemia 2017/2018   Her 10 yr Framingham risk = 5% in 2018  . Breast mass, right 07/08/14   Two 7 mm masses, ultrasound impression "low suspicion of malignancy" bx showed benign fibrocystic breast tissue; repeat R mammo 01/12/15 NEG-- Repeat diagnostic R breast mammo recommended 6 mo  . Colon cancer screening 03/2016   Cologuard negative.  Repeat 03/2019.  Marland Kitchen Concussion with no loss of consciousness 04/01/2008  . Depression   . Hypertension   . Obesity   . Perennial allergic rhinitis   . Polyarthralgia    Dr. Ouida Sills did rheum w/u which was neg approx 2012--all this got better with wt loss    Past Surgical History:  Procedure Laterality Date  . CESAREAN SECTION     x 2  . 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  Plan repeat 2 yrs.  . left foot surgery     2nd toe dislocation repair + bunion repair ([facility])  . ROTATOR CUFF REPAIR  Approx 2009   Right    Outpatient Medications Prior to Visit  Medication Sig Dispense Refill  . albuterol (PROVENTIL HFA) 108 (90 Base) MCG/ACT inhaler Inhale 2 puffs into the lungs every 4 (four) hours as needed. 1 Inhaler 1  . Beclomethasone Diprop HFA (QVAR REDIHALER) 80 MCG/ACT AERB Inhale 2 puffs into the lungs 2 (two) times daily. 1 Inhaler 11  . Calcium Carbonate-Vitamin D (CALTRATE 600+D) 600-400 MG-UNIT per chew tablet Chew 1 tablet by mouth daily.      .  cetirizine (ZYRTEC) 10 MG tablet Take 10 mg by mouth daily.    . cyclobenzaprine (FLEXERIL) 10 MG tablet Take 10 mg by mouth at bedtime as needed.  1  . irbesartan-hydrochlorothiazide (AVALIDE) 150-12.5 MG tablet TAKE 1 TABLET BY MOUTH DAILY. 30 tablet 11  . Multiple Vitamin (MULTIVITAMIN) capsule Take 1 capsule by mouth daily.      Marland Kitchen trimethoprim-polymyxin b (POLYTRIM) ophthalmic solution Place 2 drops into the right eye every 4 (four) hours. 10 mL 0  . vitamin E 100 UNIT capsule Take 100 Units by mouth daily.      . phentermine (ADIPEX-P) 37.5 MG tablet TAKE 1 TABLET EVERY DAY BEFORE BREAKFAST 30 tablet 0  . fluticasone (FLONASE) 50 MCG/ACT nasal spray Place 2 sprays into both nostrils daily. 16 g 6  . benzonatate (TESSALON) 200 MG capsule Take 1 capsule (200 mg total) by mouth 3 (three) times daily as needed for cough. (Patient not taking: Reported on 09/20/2016) 30 capsule 0   No facility-administered medications prior to visit.     Allergies  Allergen Reactions  . Augmentin [Amoxicillin-Pot Clavulanate] Rash    ROS As per HPI  PE: Blood pressure 116/79, pulse 96, temperature 98.2 F (36.8 C), temperature source Oral, resp. rate 16, height 5' 3.25" (1.607 m), weight  184 lb 12 oz (83.8 kg), SpO2 100 %. Gen: Alert, well appearing.  Patient is oriented to person, place, time, and situation. AFFECT: pleasant, lucid thought and speech. CV: RRR, no m/r/g.   LUNGS: CTA bilat, nonlabored resps, good aeration in all lung fields. EXT: no clubbing, cyanosis, or edema.    LABS:    Chemistry      Component Value Date/Time   NA 141 03/26/2016 0932   K 4.0 03/26/2016 0932   CL 102 03/26/2016 0932   CO2 33 (H) 03/26/2016 0932   BUN 16 03/26/2016 0932   CREATININE 0.66 03/26/2016 0932      Component Value Date/Time   CALCIUM 9.3 03/26/2016 0932   ALKPHOS 69 03/26/2016 0932   AST 15 03/26/2016 0932   ALT 18 03/26/2016 0932   BILITOT 0.9 03/26/2016 0932     Lab Results  Component  Value Date   CHOL 253 (H) 03/26/2016   HDL 69.70 03/26/2016   LDLCALC 169 (H) 03/26/2016   LDLDIRECT 146.6 09/15/2008   TRIG 72.0 03/26/2016   CHOLHDL 4 03/26/2016   Lab Results  Component Value Date   TSH 2.37 03/26/2016   Lab Results  Component Value Date   WBC 5.7 03/26/2016   HGB 13.5 03/26/2016   HCT 40.6 03/26/2016   MCV 88.1 03/26/2016   PLT 293.0 03/26/2016     IMPRESSION AND PLAN:  1) HTN: The current medical regimen is effective;  continue present plan and medications. Lytes/cr today.  2) Obesity: continue phentermine qd and continue to work harder on TLC. I printed rx's for phentermine today for this month, August 2018, and Sept 2018.  Appropriate fill on/after date was noted on each rx.  3) Mild persistent asthma: The current medical regimen is effective;  continue present plan and medications.  I recommended pt make a separate appointment so we can address her complaint of joint pains adequately.  An After Visit Summary was printed and given to the patient.  FOLLOW UP: Return in about 6 months (around 03/23/2017) for annual CPE (fasting).  Signed:  Crissie Sickles, MD           09/20/2016

## 2016-09-21 ENCOUNTER — Encounter: Payer: Self-pay | Admitting: *Deleted

## 2016-11-29 ENCOUNTER — Ambulatory Visit (INDEPENDENT_AMBULATORY_CARE_PROVIDER_SITE_OTHER): Payer: BLUE CROSS/BLUE SHIELD

## 2016-11-29 ENCOUNTER — Encounter: Payer: Self-pay | Admitting: Podiatry

## 2016-11-29 ENCOUNTER — Ambulatory Visit (INDEPENDENT_AMBULATORY_CARE_PROVIDER_SITE_OTHER): Payer: BLUE CROSS/BLUE SHIELD | Admitting: Podiatry

## 2016-11-29 ENCOUNTER — Other Ambulatory Visit: Payer: Self-pay | Admitting: Podiatry

## 2016-11-29 VITALS — BP 130/93 | HR 97 | Resp 16

## 2016-11-29 DIAGNOSIS — S99921A Unspecified injury of right foot, initial encounter: Secondary | ICD-10-CM

## 2016-11-29 DIAGNOSIS — M2041 Other hammer toe(s) (acquired), right foot: Secondary | ICD-10-CM

## 2016-11-29 DIAGNOSIS — M21619 Bunion of unspecified foot: Secondary | ICD-10-CM | POA: Diagnosis not present

## 2016-11-29 DIAGNOSIS — M779 Enthesopathy, unspecified: Secondary | ICD-10-CM

## 2016-11-29 DIAGNOSIS — M7751 Other enthesopathy of right foot: Secondary | ICD-10-CM | POA: Diagnosis not present

## 2016-11-29 MED ORDER — TRIAMCINOLONE ACETONIDE 10 MG/ML IJ SUSP
10.0000 mg | Freq: Once | INTRAMUSCULAR | Status: AC
  Administered 2016-11-29: 10 mg

## 2016-11-29 MED ORDER — TRIAMCINOLONE ACETONIDE 10 MG/ML IJ SUSP
10.0000 mg | Freq: Once | INTRAMUSCULAR | Status: AC
Start: 2016-11-29 — End: 2016-11-29
  Administered 2016-11-29: 10 mg

## 2016-11-29 NOTE — Progress Notes (Signed)
Subjective:    Patient ID: Laura Green, female   DOB: 61 y.o.   MRN: [id number]   HPI patient states that on August 9 80 large can of pineapple juice at a grocery store fell on her second toe and while it did not initially cause pain it has gradually become more painful for her. States she feels like the toe was lifting somewhat in the air and that it is irritated on top red and sore and she did have bleeding after the injury. Also complains of more pain on top of her foot    Review of Systems  All other systems reviewed and are negative.       Objective:  Physical Exam  Constitutional: She appears well-developed and well-nourished.  Cardiovascular: Intact distal pulses.   Pulmonary/Chest: Effort normal.  Musculoskeletal: Normal range of motion.  Neurological: She is alert.  Skin: Skin is warm.  Nursing note and vitals reviewed.  neurovascular status intact muscle strength adequate with patient found to have moderate structural bunion deformity right with history of correction left and mild elevation second digit right with redness around the proximal interphalangeal joint with fluid buildup and corrected second digit on the left foot. There is also noted to be discomfort in the midfoot right with probable spur formation and pain with palpation that's been present for a fairly     Assessment:    Trauma to the right second toe with inflammatory changes of the capsule and possible development of hammertoe deformity with chronic structural bunion deformity and spur formation mid foot with inflammatory tendinitis     Plan:    H&P conditions reviewed discussed. At this point I also reviewed x-rays and for the second toe I did do a nerve block and then injected a small amount of dexamethasone Kenalog into the interphalangeal joint and applied padding. I also injected the tendon complex midfoot 3 mg Kenalog 5 mill grams Xylocaine and educated her on spur formation and she'll be seen back  to reevaluate again in 4 weeks and may ultimately require structural bunion digital fusion procedure depending on response  X-rays indicate mild elevation second digit right with moderate structural bunion and spurring of the midtarsal joint right

## 2016-12-27 ENCOUNTER — Ambulatory Visit (INDEPENDENT_AMBULATORY_CARE_PROVIDER_SITE_OTHER): Payer: BLUE CROSS/BLUE SHIELD | Admitting: Podiatry

## 2016-12-27 DIAGNOSIS — M21619 Bunion of unspecified foot: Secondary | ICD-10-CM

## 2016-12-27 DIAGNOSIS — M779 Enthesopathy, unspecified: Secondary | ICD-10-CM

## 2016-12-27 DIAGNOSIS — M2041 Other hammer toe(s) (acquired), right foot: Secondary | ICD-10-CM

## 2016-12-27 DIAGNOSIS — S99921A Unspecified injury of right foot, initial encounter: Secondary | ICD-10-CM | POA: Diagnosis not present

## 2016-12-27 NOTE — Progress Notes (Signed)
Subjective:    Patient ID: Laura Green, female   DOB: 61 y.o.   MRN: [id number]   HPI patient presents stating the medication helped for a little while but it's starting to hurt again and that toe is elevated and also the bunion deformity    ROS      Objective:  Physical Exam neurovascular status intact with inflammation around the second MPJ right with fluid buildup around the joint surface and moderate midfoot pain more lateral in its orientation right which may be due to compensatory gait pattern     Assessment:    Inflammatory capsulitis second MPJ right with history of injury that may have caused flexor plate dislocation with elevation of the digit and structural bunion deformity     Plan:  H&P and reviewed condition. At this point were to try a functional orthotic to disperse pressure off the second MPJ and hopefully prevent her from needing surgery ultimately this may require a digital fusion along with structural bunion correction right. Patient is scheduled with [employer] and will have a relatively softer orthotic made with a dispersion pad and metatarsal lifting around the second MPJ right

## 2017-01-22 ENCOUNTER — Ambulatory Visit (INDEPENDENT_AMBULATORY_CARE_PROVIDER_SITE_OTHER): Payer: BLUE CROSS/BLUE SHIELD | Admitting: Orthotics

## 2017-01-22 DIAGNOSIS — M779 Enthesopathy, unspecified: Secondary | ICD-10-CM | POA: Diagnosis not present

## 2017-01-22 DIAGNOSIS — S99921A Unspecified injury of right foot, initial encounter: Secondary | ICD-10-CM

## 2017-01-22 NOTE — Progress Notes (Signed)
Patient presents with painful MPJ 2 Right due to trauma (can of fruit fell on 2 toe); she also has compensatory pain on lateral aspect of foot.  Goal is offload MPJ while encouraging laying off lateral side. Plan on semi rigid device with 2* valgus post/wedge and 2nd met head relief. Cushion.

## 2017-01-29 ENCOUNTER — Ambulatory Visit: Payer: BLUE CROSS/BLUE SHIELD | Admitting: Family Medicine

## 2017-02-13 ENCOUNTER — Ambulatory Visit: Payer: BLUE CROSS/BLUE SHIELD | Admitting: Orthotics

## 2017-02-13 DIAGNOSIS — M779 Enthesopathy, unspecified: Secondary | ICD-10-CM

## 2017-02-13 NOTE — Progress Notes (Signed)
Patient came in today to pick up custom made foot orthotics.  However, she did not bring appropriate footwear in for them to accommodate.  Advised her to return w/ appropriate footwear and I can make adjustments if needed.  She was fine with that.

## 2017-03-04 ENCOUNTER — Ambulatory Visit: Payer: BLUE CROSS/BLUE SHIELD | Admitting: Orthotics

## 2017-03-04 DIAGNOSIS — M779 Enthesopathy, unspecified: Secondary | ICD-10-CM

## 2017-03-04 NOTE — Progress Notes (Signed)
Patient came back in with f/o.Laura Green didn't got into any shoes she has, so I will have Richie remake as dress slim

## 2017-03-15 ENCOUNTER — Telehealth: Payer: Self-pay | Admitting: Podiatry

## 2017-03-15 NOTE — Telephone Encounter (Signed)
Called Pam with [employer] and left voicemail letting her know I got the letter she mailed in regards to the medical records request but that the "attached" signed form was not with it. I asked her to fax that to my attention at [phone] or she could call me directly if she needed to speak with me at [phone].

## 2017-03-19 ENCOUNTER — Other Ambulatory Visit: Payer: BLUE CROSS/BLUE SHIELD | Admitting: Orthotics

## 2017-03-19 ENCOUNTER — Telehealth: Payer: Self-pay | Admitting: Podiatry

## 2017-03-19 NOTE — Telephone Encounter (Signed)
Called and left message for Laura Green with [employer] to call me back in regards to medical records request. A signed release form was not attached with the request and I need that before I can complete the request. Asked Laura Green to call me back on my direct number at [phone] and to leave me a message if I was unable to answer.

## 2017-03-19 NOTE — Telephone Encounter (Signed)
Hi Laura Green, this Laura Green calling from [employer]. I'm calling you back in regards to Laura Green. I have given her a call to just have her to follow up with you because I cannot find a signed medical authorization. I have mailed one to her but have not received it back. I will just double check on that and if you just want to place that on the side and once I get that I will send it to you or have her call you to request the medical records. Thanks for following up. Have a great day. Bye bye.

## 2017-03-21 ENCOUNTER — Ambulatory Visit: Payer: BLUE CROSS/BLUE SHIELD | Admitting: Orthotics

## 2017-03-21 DIAGNOSIS — M779 Enthesopathy, unspecified: Secondary | ICD-10-CM

## 2017-03-21 NOTE — Progress Notes (Signed)
Patient came in today to pick up custom made foot orthotics.  The goals were accomplished and the patient reported no dissatisfaction with said orthotics.  Patient was advised of breakin period and how to report any issues. 

## 2017-03-27 ENCOUNTER — Encounter: Payer: Self-pay | Admitting: Family Medicine

## 2017-03-27 ENCOUNTER — Other Ambulatory Visit: Payer: Self-pay

## 2017-03-27 ENCOUNTER — Ambulatory Visit (INDEPENDENT_AMBULATORY_CARE_PROVIDER_SITE_OTHER): Payer: BLUE CROSS/BLUE SHIELD | Admitting: Family Medicine

## 2017-03-27 VITALS — BP 129/83 | HR 78 | Temp 98.2°F | Resp 16 | Ht 63.5 in | Wt 191.0 lb

## 2017-03-27 DIAGNOSIS — E669 Obesity, unspecified: Secondary | ICD-10-CM | POA: Diagnosis not present

## 2017-03-27 DIAGNOSIS — Z23 Encounter for immunization: Secondary | ICD-10-CM | POA: Diagnosis not present

## 2017-03-27 DIAGNOSIS — Z0000 Encounter for general adult medical examination without abnormal findings: Secondary | ICD-10-CM | POA: Diagnosis not present

## 2017-03-27 DIAGNOSIS — I10 Essential (primary) hypertension: Secondary | ICD-10-CM | POA: Diagnosis not present

## 2017-03-27 LAB — COMPREHENSIVE METABOLIC PANEL
ALT: 22 U/L (ref 0–35)
AST: 16 U/L (ref 0–37)
Albumin: 3.4 g/dL — ABNORMAL LOW (ref 3.5–5.2)
Alkaline Phosphatase: 70 U/L (ref 39–117)
BILIRUBIN TOTAL: 0.8 mg/dL (ref 0.2–1.2)
BUN: 18 mg/dL (ref 6–23)
CHLORIDE: 102 meq/L (ref 96–112)
CO2: 33 meq/L — AB (ref 19–32)
Calcium: 8.9 mg/dL (ref 8.4–10.5)
Creatinine, Ser: 0.62 mg/dL (ref 0.40–1.20)
GFR: 103.54 mL/min (ref >60.00)
GLUCOSE: 87 mg/dL (ref 70–99)
Potassium: 4 mEq/L (ref 3.5–5.1)
Sodium: 141 mEq/L (ref 135–145)
Total Protein: 6.9 g/dL (ref 6.0–8.3)

## 2017-03-27 LAB — CBC WITH DIFFERENTIAL/PLATELET
BASOS PCT: 1.3 % (ref 0.0–3.0)
Basophils Absolute: 0.1 10*3/uL (ref 0.0–0.1)
EOS PCT: 3.4 % (ref 0.0–5.0)
Eosinophils Absolute: 0.2 10*3/uL (ref 0.0–0.7)
HCT: 38.9 % (ref 36.0–46.0)
Hemoglobin: 13 g/dL (ref 12.0–15.0)
LYMPHS ABS: 1.4 10*3/uL (ref 0.7–4.0)
Lymphocytes Relative: 24.9 % (ref 12.0–46.0)
MCHC: 33.5 g/dL (ref 30.0–36.0)
MCV: 88.1 fl (ref 78.0–100.0)
MONOS PCT: 8.4 % (ref 3.0–12.0)
Monocytes Absolute: 0.5 10*3/uL (ref 0.1–1.0)
NEUTROS ABS: 3.5 10*3/uL (ref 1.4–7.7)
NEUTROS PCT: 62 % (ref 43.0–77.0)
PLATELETS: 249 10*3/uL (ref 150.0–400.0)
RBC: 4.42 Mil/uL (ref 3.87–5.11)
RDW: 13.2 % (ref 11.5–15.5)
WBC: 5.7 10*3/uL (ref 4.0–10.5)

## 2017-03-27 LAB — TSH: TSH: 1.75 u[IU]/mL (ref 0.35–4.50)

## 2017-03-27 LAB — LIPID PANEL
CHOL/HDL RATIO: 3
Cholesterol: 234 mg/dL — ABNORMAL HIGH (ref 0–200)
HDL: 69.3 mg/dL (ref >39.00)
LDL CALC: 149 mg/dL — AB (ref 0–99)
NONHDL: 164.71
Triglycerides: 80 mg/dL (ref 0.0–149.0)
VLDL: 16 mg/dL (ref 0.0–40.0)

## 2017-03-27 MED ORDER — PHENTERMINE HCL 37.5 MG PO TABS: ORAL_TABLET | ORAL | 0 refills | Status: DC

## 2017-03-27 NOTE — Addendum Note (Signed)
Addended by: Gordy Councilman on: 03/27/2017 09:59 AM   Modules accepted: Orders

## 2017-03-27 NOTE — Progress Notes (Signed)
Office Note 03/27/2017  CC:  Chief Complaint  Patient presents with  . Annual Exam    HPI:  Laura Green is a 62 y.o. White female who is here for annual health maintenance exam.  Eye exam: 2 weeks ago. Dental preventatives: UTD. Exercise: some walking. Diet: not working on anything in particular.  She asks for her phentermine rx again today.   Past Medical History:  Diagnosis Date  . Asthma   . Borderline hyperlipidemia 2017/2018   Her 10 yr Framingham risk = 5% in 2018  . Breast mass, right 07/08/14   Two 7 mm masses, ultrasound impression "low suspicion of malignancy" bx showed benign fibrocystic breast tissue; repeat R mammo 01/12/15 NEG-- Repeat diagnostic R breast mammo recommended 6 mo  . Colon cancer screening 03/2016   Cologuard negative.  Repeat 03/2019.  Marland Kitchen Concussion with no loss of consciousness 04/01/2008  . Depression   . Hypertension   . Obesity   . Perennial allergic rhinitis   . Polyarthralgia    Dr. Ouida Sills did rheum w/u which was neg approx 2012--all this got better with wt loss    Past Surgical History:  Procedure Laterality Date  . CESAREAN SECTION     x 2  . 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  Plan repeat 2 yrs.  . left foot surgery     2nd toe dislocation repair + bunion repair ([facility])  . ROTATOR CUFF REPAIR  Approx 2009   Right    Family History  Problem Relation Age of Onset  . Arthritis Mother   . Colon cancer Mother   . COPD Father   . Hyperlipidemia Father     Social History   Socioeconomic History  . Marital status: Married    Spouse name: Not on file  . Number of children: Not on file  . Years of education: Not on file  . Highest education level: Not on file  Social Needs  . Financial resource strain: Not on file  . Food insecurity - worry: Not on file  . Food insecurity - inability: Not on file  . Transportation needs - medical: Not on file  . Transportation needs - non-medical: Not on file   Occupational History  . Not on file  Tobacco Use  . Smoking status: Never Smoker  . Smokeless tobacco: Never Used  Substance and Sexual Activity  . Alcohol use: No  . Drug use: No  . Sexual activity: Yes  Other Topics Concern  . Not on file  Social History Narrative   Married 40+ yrs, two daughters, 3 grandchildren.Marland Kitchen   [place] prior to [place] since [place]s.   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   No T/A/Ds.   Exercise: Zumba weekly.    Outpatient Medications Prior to Visit  Medication Sig Dispense Refill  . albuterol (PROVENTIL HFA) 108 (90 Base) MCG/ACT inhaler Inhale 2 puffs into the lungs every 4 (four) hours as needed. 1 Inhaler 1  . Beclomethasone Diprop HFA (QVAR REDIHALER) 80 MCG/ACT AERB Inhale 2 puffs into the lungs 2 (two) times daily. 1 Inhaler 11  . Calcium Carbonate-Vitamin D (CALTRATE 600+D) 600-400 MG-UNIT per chew tablet Chew 1 tablet by mouth daily.      . cetirizine (ZYRTEC) 10 MG tablet Take 10 mg by mouth daily.    . irbesartan-hydrochlorothiazide (AVALIDE) 150-12.5 MG tablet TAKE 1 TABLET BY MOUTH DAILY. 30 tablet 11  . Multiple Vitamin (MULTIVITAMIN) capsule Take 1 capsule  by mouth daily.      Marland Kitchen olopatadine (PATANOL) 0.1 % ophthalmic solution INSTILL 1 DROP(S) IN EACH EYE BY OPHTHALMIC ROUTE 2 TIME(S) PER DAY  6  . vitamin E 100 UNIT capsule Take 100 Units by mouth daily.      . cyclobenzaprine (FLEXERIL) 10 MG tablet Take 10 mg by mouth at bedtime as needed.  1  . fluticasone (FLONASE) 50 MCG/ACT nasal spray Place 2 sprays into both nostrils daily. 16 g 6  . phentermine (ADIPEX-P) 37.5 MG tablet TAKE 1 TABLET EVERY DAY BEFORE BREAKFAST (Patient not taking: Reported on 03/27/2017) 30 tablet 0  . trimethoprim-polymyxin b (POLYTRIM) ophthalmic solution Place 2 drops into the right eye every 4 (four) hours. (Patient not taking: Reported on 03/27/2017) 10 mL 0   No facility-administered medications prior to visit.     Allergies   Allergen Reactions  . Augmentin [Amoxicillin-Pot Clavulanate] Rash    ROS Review of Systems  Constitutional: Negative for appetite change, chills, fatigue and fever.  HENT: Negative for congestion, dental problem, ear pain and sore throat.   Eyes: Negative for discharge, redness and visual disturbance.  Respiratory: Negative for cough, chest tightness, shortness of breath and wheezing.   Cardiovascular: Negative for chest pain, palpitations and leg swelling.  Gastrointestinal: Negative for abdominal pain, blood in stool, diarrhea, nausea and vomiting.  Genitourinary: Negative for difficulty urinating, dysuria, flank pain, frequency, hematuria and urgency.  Musculoskeletal: Positive for arthralgias. Negative for back pain, joint swelling, myalgias and neck stiffness.  Skin: Negative for pallor and rash.  Neurological: Negative for dizziness, speech difficulty, weakness and headaches.  Hematological: Negative for adenopathy. Does not bruise/bleed easily.  Psychiatric/Behavioral: Negative for confusion and sleep disturbance. The patient is not nervous/anxious.     PE; Blood pressure 129/83, pulse 78, temperature 98.2 F (36.8 C), temperature source Oral, resp. rate 16, height 5' 3.5" (1.613 m), weight 191 lb (86.6 kg), SpO2 96 %. Body mass index is 33.3 kg/m.  Exam chaperoned by Starla Link, CMA. Gen: Alert, well appearing.  Patient is oriented to person, place, time, and situation. AFFECT: pleasant, lucid thought and speech. ENT: Ears: EACs clear, normal epithelium.  TMs with good light reflex and landmarks bilaterally.  Eyes: no injection, icteris, swelling, or exudate.  EOMI, PERRLA. Nose: no drainage or turbinate edema/swelling.  No injection or focal lesion.  Mouth: lips without lesion/swelling.  Oral mucosa pink and moist.  Dentition intact and without obvious caries or gingival swelling.  Oropharynx without erythema, exudate, or swelling.  Neck: supple/nontender.  No LAD, mass, or  TM.  Carotid pulses 2+ bilaterally, without bruits. CV: RRR, no m/r/g.   LUNGS: CTA bilat, nonlabored resps, good aeration in all lung fields. ABD: soft, NT, ND, BS normal.  No hepatospenomegaly or mass.  No bruits. EXT: no clubbing, cyanosis, or edema.  Musculoskeletal: no joint swelling, erythema, warmth, or tenderness.  ROM of all joints intact. Skin - no sores or suspicious lesions or rashes or color changes   Pertinent labs:  Lab Results  Component Value Date   TSH 2.37 03/26/2016   Lab Results  Component Value Date   WBC 5.7 03/26/2016   HGB 13.5 03/26/2016   HCT 40.6 03/26/2016   MCV 88.1 03/26/2016   PLT 293.0 03/26/2016   Lab Results  Component Value Date   CREATININE 0.74 09/20/2016   BUN 16 09/20/2016   NA 140 09/20/2016   K 4.0 09/20/2016   CL 103 09/20/2016   CO2 32 09/20/2016  Lab Results  Component Value Date   ALT 18 03/26/2016   AST 15 03/26/2016   ALKPHOS 69 03/26/2016   BILITOT 0.9 03/26/2016   Lab Results  Component Value Date   CHOL 253 (H) 03/26/2016   Lab Results  Component Value Date   HDL 69.70 03/26/2016   Lab Results  Component Value Date   LDLCALC 169 (H) 03/26/2016   Lab Results  Component Value Date   TRIG 72.0 03/26/2016   Lab Results  Component Value Date   CHOLHDL 4 03/26/2016    ASSESSMENT AND PLAN:   Health maintenance exam: Reviewed age and gender appropriate health maintenance issues (prudent diet, regular exercise, health risks of tobacco and excessive alcohol, use of seatbelts, fire alarms in home, use of sunscreen).  Also reviewed age and gender appropriate health screening as well as vaccine recommendations. Vaccines: Tdap UTD.  Flu vaccine--was given at [facility] 11/2016.   Shingrix--#1 given here today. Labs: fasting HP (HTN, obesity--BMI 32)). Cervical ca screening: most recent Pap smear--to be addressed by GYN next week. Breast ca screening: Mammogram 09/13/16 via her GYN, Dr. Marvel Plan. Colon ca  screening: cologuard NEG 03/2016--repeat 03/2019.  An After Visit Summary was printed and given to the patient.  FOLLOW UP:  No Follow-up on file.  Signed:  Crissie Sickles, MD           03/27/2017

## 2017-03-27 NOTE — Patient Instructions (Signed)
Buy over the counter generic Zaditor eye drops and follow instructions on packaging (for allergy symptoms in eyes).   Health Maintenance, Female Adopting a healthy lifestyle and getting preventive care can go a long way to promote health and wellness. Talk with your health care provider about what schedule of regular examinations is right for you. This is a good chance for you to check in with your provider about disease prevention and staying healthy. In between checkups, there are plenty of things you can do on your own. Experts have done a lot of research about which lifestyle changes and preventive measures are most likely to keep you healthy. Ask your health care provider for more information. Weight and diet Eat a healthy diet  Be sure to include plenty of vegetables, fruits, low-fat dairy products, and lean protein.  Do not eat a lot of foods high in solid fats, added sugars, or salt.  Get regular exercise. This is one of the most important things you can do for your health. ? Most adults should exercise for at least 150 minutes each week. The exercise should increase your heart rate and make you sweat (moderate-intensity exercise). ? Most adults should also do strengthening exercises at least twice a week. This is in addition to the moderate-intensity exercise.  Maintain a healthy weight  Body mass index (BMI) is a measurement that can be used to identify possible weight problems. It estimates body fat based on height and weight. Your health care provider can help determine your BMI and help you achieve or maintain a healthy weight.  For females 42 years of age and older: ? A BMI below 18.5 is considered underweight. ? A BMI of 18.5 to 24.9 is normal. ? A BMI of 25 to 29.9 is considered overweight. ? A BMI of 30 and above is considered obese.  Watch levels of cholesterol and blood lipids  You should start having your blood tested for lipids and cholesterol at 63 years of age,  then have this test every 5 years.  You may need to have your cholesterol levels checked more often if: ? Your lipid or cholesterol levels are high. ? You are older than 63 years of age. ? You are at high risk for heart disease.  Cancer screening Lung Cancer  Lung cancer screening is recommended for adults 58-80 years old who are at high risk for lung cancer because of a history of smoking.  A yearly low-dose CT scan of the lungs is recommended for people who: ? Currently smoke. ? Have quit within the past 15 years. ? Have at least a 30-pack-year history of smoking. A pack year is smoking an average of one pack of cigarettes a day for 1 year.  Yearly screening should continue until it has been 15 years since you quit.  Yearly screening should stop if you develop a health problem that would prevent you from having lung cancer treatment.  Breast Cancer  Practice breast self-awareness. This means understanding how your breasts normally appear and feel.  It also means doing regular breast self-exams. Let your health care provider know about any changes, no matter how small.  If you are in your 20s or 30s, you should have a clinical breast exam (CBE) by a health care provider every 1-3 years as part of a regular health exam.  If you are 3 or older, have a CBE every year. Also consider having a breast X-ray (mammogram) every year.  If you have a family  history of breast cancer, talk to your health care provider about genetic screening.  If you are at high risk for breast cancer, talk to your health care provider about having an MRI and a mammogram every year.  Breast cancer gene (BRCA) assessment is recommended for women who have family members with BRCA-related cancers. BRCA-related cancers include: ? Breast. ? Ovarian. ? Tubal. ? Peritoneal cancers.  Results of the assessment will determine the need for genetic counseling and BRCA1 and BRCA2 testing.  Cervical Cancer Your  health care provider may recommend that you be screened regularly for cancer of the pelvic organs (ovaries, uterus, and vagina). This screening involves a pelvic examination, including checking for microscopic changes to the surface of your cervix (Pap test). You may be encouraged to have this screening done every 3 years, beginning at age 40.  For women ages 15-65, health care providers may recommend pelvic exams and Pap testing every 3 years, or they may recommend the Pap and pelvic exam, combined with testing for human papilloma virus (HPV), every 5 years. Some types of HPV increase your risk of cervical cancer. Testing for HPV may also be done on women of any age with unclear Pap test results.  Other health care providers may not recommend any screening for nonpregnant women who are considered low risk for pelvic cancer and who do not have symptoms. Ask your health care provider if a screening pelvic exam is right for you.  If you have had past treatment for cervical cancer or a condition that could lead to cancer, you need Pap tests and screening for cancer for at least 20 years after your treatment. If Pap tests have been discontinued, your risk factors (such as having a new sexual partner) need to be reassessed to determine if screening should resume. Some women have medical problems that increase the chance of getting cervical cancer. In these cases, your health care provider may recommend more frequent screening and Pap tests.  Colorectal Cancer  This type of cancer can be detected and often prevented.  Routine colorectal cancer screening usually begins at 63 years of age and continues through 63 years of age.  Your health care provider may recommend screening at an earlier age if you have risk factors for colon cancer.  Your health care provider may also recommend using home test kits to check for hidden blood in the stool.  A small camera at the end of a tube can be used to examine your  colon directly (sigmoidoscopy or colonoscopy). This is done to check for the earliest forms of colorectal cancer.  Routine screening usually begins at age 28.  Direct examination of the colon should be repeated every 5-10 years through 63 years of age. However, you may need to be screened more often if early forms of precancerous polyps or small growths are found.  Skin Cancer  Check your skin from head to toe regularly.  Tell your health care provider about any new moles or changes in moles, especially if there is a change in a mole's shape or color.  Also tell your health care provider if you have a mole that is larger than the size of a pencil eraser.  Always use sunscreen. Apply sunscreen liberally and repeatedly throughout the day.  Protect yourself by wearing long sleeves, pants, a wide-brimmed hat, and sunglasses whenever you are outside.  Heart disease, diabetes, and high blood pressure  High blood pressure causes heart disease and increases the risk of stroke.  High blood pressure is more likely to develop in: ? People who have blood pressure in the high end of the normal range (130-139/85-89 mm Hg). ? People who are overweight or obese. ? People who are African American.  If you are 75-32 years of age, have your blood pressure checked every 3-5 years. If you are 29 years of age or older, have your blood pressure checked every year. You should have your blood pressure measured twice-once when you are at a hospital or clinic, and once when you are not at a hospital or clinic. Record the average of the two measurements. To check your blood pressure when you are not at a hospital or clinic, you can use: ? An automated blood pressure machine at a pharmacy. ? A home blood pressure monitor.  If you are between 46 years and 72 years old, ask your health care provider if you should take aspirin to prevent strokes.  Have regular diabetes screenings. This involves taking a blood sample  to check your fasting blood sugar level. ? If you are at a normal weight and have a low risk for diabetes, have this test once every three years after 63 years of age. ? If you are overweight and have a high risk for diabetes, consider being tested at a younger age or more often. Preventing infection Hepatitis B  If you have a higher risk for hepatitis B, you should be screened for this virus. You are considered at high risk for hepatitis B if: ? You were born in a country where hepatitis B is common. Ask your health care provider which countries are considered high risk. ? Your parents were born in a high-risk country, and you have not been immunized against hepatitis B (hepatitis B vaccine). ? You have HIV or AIDS. ? You use needles to inject street drugs. ? You live with someone who has hepatitis B. ? You have had sex with someone who has hepatitis B. ? You get hemodialysis treatment. ? You take certain medicines for conditions, including cancer, organ transplantation, and autoimmune conditions.  Hepatitis C  Blood testing is recommended for: ? Everyone born from [birth year] through 1966. ? Anyone with known risk factors for hepatitis C.  Sexually transmitted infections (STIs)  You should be screened for sexually transmitted infections (STIs) including gonorrhea and chlamydia if: ? You are sexually active and are younger than 63 years of age. ? You are older than 63 years of age and your health care provider tells you that you are at risk for this type of infection. ? Your sexual activity has changed since you were last screened and you are at an increased risk for chlamydia or gonorrhea. Ask your health care provider if you are at risk.  If you do not have HIV, but are at risk, it may be recommended that you take a prescription medicine daily to prevent HIV infection. This is called pre-exposure prophylaxis (PrEP). You are considered at risk if: ? You are sexually active and do not  regularly use condoms or know the HIV status of your partner(s). ? You take drugs by injection. ? You are sexually active with a partner who has HIV.  Talk with your health care provider about whether you are at high risk of being infected with HIV. If you choose to begin PrEP, you should first be tested for HIV. You should then be tested every 3 months for as long as you are taking PrEP. Pregnancy  If you  are premenopausal and you may become pregnant, ask your health care provider about preconception counseling.  If you may become pregnant, take 400 to 800 micrograms (mcg) of folic acid every day.  If you want to prevent pregnancy, talk to your health care provider about birth control (contraception). Osteoporosis and menopause  Osteoporosis is a disease in which the bones lose minerals and strength with aging. This can result in serious bone fractures. Your risk for osteoporosis can be identified using a bone density scan.  If you are 14 years of age or older, or if you are at risk for osteoporosis and fractures, ask your health care provider if you should be screened.  Ask your health care provider whether you should take a calcium or vitamin D supplement to lower your risk for osteoporosis.  Menopause may have certain physical symptoms and risks.  Hormone replacement therapy may reduce some of these symptoms and risks. Talk to your health care provider about whether hormone replacement therapy is right for you. Follow these instructions at home:  Schedule regular health, dental, and eye exams.  Stay current with your immunizations.  Do not use any tobacco products including cigarettes, chewing tobacco, or electronic cigarettes.  If you are pregnant, do not drink alcohol.  If you are breastfeeding, limit how much and how often you drink alcohol.  Limit alcohol intake to no more than 1 drink per day for nonpregnant women. One drink equals 12 ounces of beer, 5 ounces of wine, or  1 ounces of hard liquor.  Do not use street drugs.  Do not share needles.  Ask your health care provider for help if you need support or information about quitting drugs.  Tell your health care provider if you often feel depressed.  Tell your health care provider if you have ever been abused or do not feel safe at home. This information is not intended to replace advice given to you by your health care provider. Make sure you discuss any questions you have with your health care provider. Document Released: 08/28/2010 Document Revised: 07/21/2015 Document Reviewed: 11/16/2014 Elsevier Interactive Patient Education  [employer].

## 2017-03-28 ENCOUNTER — Telehealth: Payer: Self-pay | Admitting: Family Medicine

## 2017-03-28 NOTE — Telephone Encounter (Signed)
Pt. Given lab results and instructions.Verbalizes understanding. 

## 2017-04-02 DIAGNOSIS — Z124 Encounter for screening for malignant neoplasm of cervix: Secondary | ICD-10-CM | POA: Diagnosis not present

## 2017-04-02 DIAGNOSIS — Z1389 Encounter for screening for other disorder: Secondary | ICD-10-CM | POA: Diagnosis not present

## 2017-04-02 DIAGNOSIS — Z130 Encounter for screening for diseases of the blood and blood-forming organs and certain disorders involving the immune mechanism: Secondary | ICD-10-CM | POA: Diagnosis not present

## 2017-04-02 DIAGNOSIS — Z6833 Body mass index (BMI) 33.0-33.9, adult: Secondary | ICD-10-CM | POA: Diagnosis not present

## 2017-04-02 DIAGNOSIS — Z01419 Encounter for gynecological examination (general) (routine) without abnormal findings: Secondary | ICD-10-CM | POA: Diagnosis not present

## 2017-04-02 DIAGNOSIS — Z1151 Encounter for screening for human papillomavirus (HPV): Secondary | ICD-10-CM | POA: Diagnosis not present

## 2017-04-03 DIAGNOSIS — Z124 Encounter for screening for malignant neoplasm of cervix: Secondary | ICD-10-CM | POA: Diagnosis not present

## 2017-04-05 ENCOUNTER — Encounter: Payer: Self-pay | Admitting: Family Medicine

## 2017-04-05 LAB — HM PAP SMEAR: HM Pap smear: NEGATIVE

## 2017-05-14 ENCOUNTER — Other Ambulatory Visit: Payer: Self-pay | Admitting: Family Medicine

## 2017-05-14 DIAGNOSIS — I10 Essential (primary) hypertension: Secondary | ICD-10-CM

## 2017-06-13 ENCOUNTER — Ambulatory Visit (INDEPENDENT_AMBULATORY_CARE_PROVIDER_SITE_OTHER): Payer: BLUE CROSS/BLUE SHIELD | Admitting: Podiatry

## 2017-06-13 ENCOUNTER — Encounter: Payer: Self-pay | Admitting: Podiatry

## 2017-06-13 DIAGNOSIS — M21619 Bunion of unspecified foot: Secondary | ICD-10-CM | POA: Diagnosis not present

## 2017-06-13 DIAGNOSIS — M2041 Other hammer toe(s) (acquired), right foot: Secondary | ICD-10-CM | POA: Diagnosis not present

## 2017-06-13 DIAGNOSIS — M779 Enthesopathy, unspecified: Secondary | ICD-10-CM | POA: Diagnosis not present

## 2017-06-13 DIAGNOSIS — D169 Benign neoplasm of bone and articular cartilage, unspecified: Secondary | ICD-10-CM | POA: Diagnosis not present

## 2017-06-13 NOTE — Progress Notes (Signed)
Subjective:   Patient ID: Laura Green, female   DOB: 62 y.o.   MRN: [id number]   HPI Patient presents stating that she knows she is getting needs surgery on her right foot that is not gotten better and she did have an injury that occurred that has caused the second toe to lift in the air increased inflammation she also has long-term structural bunion she wants to get corrected and spur on the top of the foot she wants to get corrected while she is getting the digit corrected.  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  Patient wants surgery and explained procedure to her and risk and she is willing to accept risk and wants to have this all corrected at the same time.  I recommended digital stabilization second toe right with pin shortening osteotomy second metatarsal right to create room for the toe to come down along with Liane Comber type osteotomy right and removal of dorsal spurring from the right foot.  I spent a great deal of time going over consent form reviewing alternative treatments complications and the fact that there is no long-term guarantees of results and that all possible complications can occur as listed in the consent form.  Patient understands total recovery will take 6 months to 1 year and at this time I did dispense air fracture walker with instructions on usage and I did encourage her to call with any  questions prior to the procedure which will be done in the next 3 weeks

## 2017-06-13 NOTE — Patient Instructions (Signed)
Pre-Operative Instructions  Congratulations, you have decided to take an important step towards improving your quality of life.  You can be assured that the doctors and staff at [facility] will be with you every step of the way.  Here are some important things you should know:  1. Plan to be at the surgery center/hospital at least 1 (one) hour prior to your scheduled time, unless otherwise directed by the surgical center/hospital staff.  You must have a responsible adult accompany you, remain during the surgery and drive you home.  Make sure you have directions to the surgical center/hospital to ensure you arrive on time. 2. If you are having surgery at [facility] or [facility] hospitals, you will need a copy of your medical history and physical form from your family physician within one month prior to the date of surgery. We will give you a form for your primary physician to complete.  3. We make every effort to accommodate the date you request for surgery.  However, there are times where surgery dates or times have to be moved.  We will contact you as soon as possible if a change in schedule is required.   4. No aspirin/ibuprofen for one week before surgery.  If you are on aspirin, any non-steroidal anti-inflammatory medications (Mobic, Aleve, Ibuprofen) should not be taken seven (7) days prior to your surgery.  You make take Tylenol for pain prior to surgery.  5. Medications - If you are taking daily heart and blood pressure medications, seizure, reflux, allergy, asthma, anxiety, pain or diabetes medications, make sure you notify the surgery center/hospital before the day of surgery so they can tell you which medications you should take or avoid the day of surgery. 6. No food or drink after midnight the night before surgery unless directed otherwise by surgical center/hospital staff. 7. No alcoholic beverages 24-hours prior to surgery.  No smoking 24-hours prior or 24-hours after  surgery. 8. Wear loose pants or shorts. They should be loose enough to fit over bandages, boots, and casts. 9. Don't wear slip-on shoes. Sneakers are preferred. 10. Bring your boot with you to the surgery center/hospital.  Also bring crutches or a walker if your physician has prescribed it for you.  If you do not have this equipment, it will be provided for you after surgery. 11. If you have not been contacted by the surgery center/hospital by the day before your surgery, call to confirm the date and time of your surgery. 12. Leave-time from work may vary depending on the type of surgery you have.  Appropriate arrangements should be made prior to surgery with your employer. 13. Prescriptions will be provided immediately following surgery by your doctor.  Fill these as soon as possible after surgery and take the medication as directed. Pain medications will not be refilled on weekends and must be approved by the doctor. 14. Remove nail polish on the operative foot and avoid getting pedicures prior to surgery. 15. Wash the night before surgery.  The night before surgery wash the foot and leg well with water and the antibacterial soap provided. Be sure to pay special attention to beneath the toenails and in between the toes.  Wash for at least three (3) minutes. Rinse thoroughly with water and dry well with a towel.  Perform this wash unless told not to do so by your physician.  Enclosed: 1 Ice pack (please put in freezer the night before surgery)   1 Hibiclens skin cleaner     Pre-op instructions  If you have any questions regarding the instructions, please do not hesitate to call our office.  [place]: [address], [place], [place] [address] -- [phone]  [place]: [address] [place], [place] [address] -- [phone]  [place]: [address]  [place], [place] [address] -- [phone]  [place]: [address], [address], [place], [place] [address] -- [phone]  Website:  [email or web address] 

## 2017-06-17 ENCOUNTER — Telehealth: Payer: Self-pay | Admitting: *Deleted

## 2017-06-17 NOTE — Telephone Encounter (Signed)
"  I'd like to schedule my surgery with Dr. Paulla Dolly.  He had mentioned May 30 but I can't do it then.  I can do it the following Tuesday.  Does he have anything available on May 7?"  Yes, he can do it then.  I'll get it scheduled.  You can go on-line and register.  Let them know how you want to communicate whether it be by phone call, email or text.  "Okay, I will let them know that I want to do it by email."

## 2017-06-19 ENCOUNTER — Telehealth: Payer: Self-pay | Admitting: *Deleted

## 2017-06-19 NOTE — Telephone Encounter (Signed)
"  I'm scheduled for surgery on May 7.  I am on-line trying to pre-register with the surgical center.  It's asking me which location, [place] or [place].  I don't know which one.  Can you help me?"  You will be having your surgery at [address]  The information is on the back of the brochure that was given to you.  "Oh okay, thank you so much."

## 2017-06-20 ENCOUNTER — Telehealth: Payer: Self-pay | Admitting: Podiatry

## 2017-06-20 NOTE — Telephone Encounter (Signed)
Dr. Paulla Dolly states it would be fine for pt to get an steroid injection and postpone surgery, have pt come in tomorrow. I informed Scheduler A. Horton and she contacted pt.

## 2017-06-20 NOTE — Telephone Encounter (Signed)
Okay, thank you!

## 2017-06-20 NOTE — Telephone Encounter (Addendum)
I'm a pt of Dr. Mellody Drown. I would like to speak to him or a nurse regarding my foot surgery. If there is any way possible way I can get a call back I would highly appreciate it. My number at work is [phone]. I will be here until 1:30 pm then from 2:30 - 5:30 pm. So if someone could give me a call back, I need to discuss something regarding my surgery. Thank you and have a great day.

## 2017-06-20 NOTE — Telephone Encounter (Signed)
Left message for pt to call to schedule appt for cortisone injection on 4/26 with Dr. Paulla Dolly.

## 2017-06-20 NOTE — Telephone Encounter (Signed)
I called pt to schedule her for a cortisone injection with Dr. Paulla Dolly. Pt discussed the surgery with her Husband and decided against the cortisone injection. She will go ahead with the surgery, I notified Delydia and Val(RN).

## 2017-06-20 NOTE — Telephone Encounter (Signed)
A. Horton - scheduler spoke with pt and offered appt for tomorrow, pt states she will keep her surgery date and not get the steroid injection.

## 2017-06-20 NOTE — Telephone Encounter (Signed)
I am returning your call.  How can I help you?  "The message is actually for Dr. Paulla Dolly.  I'm calling to see if maybe I can get a cortisone shot and postpone my surgery until later.  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  I would hate for [place] both to be out on leave at the same time.  Ask Dr. Paulla Dolly if it's possible for me to hold off for now and just get a cortisone shot."  I will ask him, see what he says, and then give you a call back.

## 2017-06-28 ENCOUNTER — Encounter: Payer: Self-pay | Admitting: Family Medicine

## 2017-06-28 ENCOUNTER — Ambulatory Visit (INDEPENDENT_AMBULATORY_CARE_PROVIDER_SITE_OTHER): Payer: BLUE CROSS/BLUE SHIELD | Admitting: Family Medicine

## 2017-06-28 VITALS — BP 117/74 | HR 76 | Temp 97.9°F | Resp 16 | Ht 63.5 in | Wt 189.1 lb

## 2017-06-28 DIAGNOSIS — Z79899 Other long term (current) drug therapy: Secondary | ICD-10-CM | POA: Diagnosis not present

## 2017-06-28 DIAGNOSIS — M797 Fibromyalgia: Secondary | ICD-10-CM

## 2017-06-28 DIAGNOSIS — Z23 Encounter for immunization: Secondary | ICD-10-CM

## 2017-06-28 DIAGNOSIS — E669 Obesity, unspecified: Secondary | ICD-10-CM

## 2017-06-28 MED ORDER — PHENTERMINE HCL 37.5 MG PO TABS: ORAL_TABLET | ORAL | 0 refills | Status: DC

## 2017-06-28 MED ORDER — CELECOXIB 200 MG PO CAPS: 200.0000 mg | ORAL_CAPSULE | Freq: Every day | ORAL | 2 refills | Status: DC

## 2017-06-28 NOTE — Patient Instructions (Signed)
Stop taking aleve.

## 2017-06-28 NOTE — Progress Notes (Signed)
OFFICE VISIT  06/28/2017   CC:  Chief Complaint  Patient presents with  . Joint Pain   HPI:    Patient is a 62 y.o. Caucasian female who presents for joint and muscle pains. Onset of essentially full body joint and muscle pains about 7-10 yrs ago in her best estimation.  She does recall that at one point in time things got better when she lost wt on phentermine and topamax. Rheumatologist testing and eval in remote past was all normal (About 2012 to best of pt's recollection). Knees bilat, wrists bilat, neck, upper back/shoulders, legs.  Then goes on to say that "everything hurts"---all muscles and joints, essentially all day every day. When she gets sharp pain in L wrist she also feels some tingling/numbness in the L hand.  No hx of joint redness or swelling.  She has some soft tissue swelling chronically in distal forearms just proximal to the wrists.. Intensity of pain is the same all day long.  Intensity 7-8/10 constant.  Takes 1 aleve every night and this helps her sleep b/c it helps her pain some.  Got a new mattress and "my pillow" and neither of these helped. Exercise: essentially none.  She works full time at [employer], sometimes works in the yard.  No formal working out. Diet: tries to limit portion size, takes phentermine daily (but not in the last 1 wk b/c of pre-surgery recommendations). Has R foot/toes problems (bunion, hx of poorly healed fracture, exostosis on dorsal aspect) lately and is having surgery on it in 2 days.  ROS: no rashes or fevers.  NO eye pain or redness.  No vision or hearing c/o. No paresthesias except as mentioned in L hand above.  No extremity weakness.  No hematuria or melena. No HAs.  No CP, no SOB, no palpitaitons.  Past Medical History:  Diagnosis Date  . Asthma   . Borderline hyperlipidemia 2017/2018   Her 10 yr Framingham risk = 5% in 2018.  LDL improved 02/2017 (on no meds).  . Breast mass, right 07/08/14   Two 7 mm masses, ultrasound  impression "low suspicion of malignancy" bx showed benign fibrocystic breast tissue; repeat R mammo 01/12/15 NEG-- Repeat diagnostic R breast mammo recommended 6 mo  . Colon cancer screening 03/2016   Cologuard negative.  Repeat 03/2019.  Marland Kitchen Concussion with no loss of consciousness 04/01/2008  . Depression   . Hypertension   . Obesity   . Perennial allergic rhinitis   . Polyarthralgia    Dr. Ouida Sills did rheum w/u which was neg approx 2012--all this got better with wt loss    Past Surgical History:  Procedure Laterality Date  . CESAREAN SECTION     x 2  . 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  Plan repeat 2 yrs.  . left foot surgery     2nd toe dislocation repair + bunion repair ([facility])  . ROTATOR CUFF REPAIR  Approx 2009   Right    Outpatient Medications Prior to Visit  Medication Sig Dispense Refill  . albuterol (PROVENTIL HFA) 108 (90 Base) MCG/ACT inhaler Inhale 2 puffs into the lungs every 4 (four) hours as needed. 1 Inhaler 1  . Beclomethasone Diprop HFA (QVAR REDIHALER) 80 MCG/ACT AERB Inhale 2 puffs into the lungs 2 (two) times daily. 1 Inhaler 11  . Calcium Carbonate-Vitamin D (CALTRATE 600+D) 600-400 MG-UNIT per chew tablet Chew 1 tablet by mouth daily.      . cetirizine (ZYRTEC) 10  MG tablet Take 10 mg by mouth daily.    . irbesartan-hydrochlorothiazide (AVALIDE) 150-12.5 MG tablet TAKE 1 TABLET BY MOUTH DAILY. 30 tablet 6  . Multiple Vitamin (MULTIVITAMIN) capsule Take 1 capsule by mouth daily.      Marland Kitchen trimethoprim-polymyxin b (POLYTRIM) ophthalmic solution Place 2 drops into the right eye every 4 (four) hours. 10 mL 0  . vitamin E 100 UNIT capsule Take 100 Units by mouth daily.      . phentermine (ADIPEX-P) 37.5 MG tablet TAKE 1 TABLET EVERY DAY BEFORE BREAKFAST 30 tablet 0  . fluticasone (FLONASE) 50 MCG/ACT nasal spray Place 2 sprays into both nostrils daily. 16 g 6  . cyclobenzaprine (FLEXERIL) 10 MG tablet Take 10 mg by mouth at bedtime as needed.  1  .  olopatadine (PATANOL) 0.1 % ophthalmic solution INSTILL 1 DROP(S) IN EACH EYE BY OPHTHALMIC ROUTE 2 TIME(S) PER DAY  6   No facility-administered medications prior to visit.     Allergies  Allergen Reactions  . Augmentin [Amoxicillin-Pot Clavulanate] Rash    ROS As per HPI  PE: Blood pressure 117/74, pulse 76, temperature 97.9 F (36.6 C), temperature source Oral, resp. rate 16, height 5' 3.5" (1.613 m), weight 189 lb 2 oz (85.8 kg), SpO2 97 %. Body mass index is 32.98 kg/m.  Gen: Alert, well appearing.  Patient is oriented to person, place, time, and situation. AFFECT: pleasant, lucid thought and speech. ENT: Ears: EACs clear, normal epithelium.  TMs with good light reflex and landmarks bilaterally.  Eyes: no injection, icteris, swelling, or exudate.  EOMI, PERRLA. Nose: no drainage or turbinate edema/swelling.  No injection or focal lesion.  Mouth: lips without lesion/swelling.  Oral mucosa pink and moist.  Dentition intact and without obvious caries or gingival swelling.  Oropharynx without erythema, exudate, or swelling.  Neck - No masses or thyromegaly or limitation in range of motion CV: RRR, no m/r/g.   LUNGS: CTA bilat, nonlabored resps, good aeration in all lung fields. EXT: no clubbing, cyanosis, or edema.  Musculoskeletal: no joint swelling, erythema, warmth, or tenderness.  ROM of all joints intact. She has minimal tenderness of left sided trap muscle from top of shoulder down into mid back. All other muscles w/out tenderness.  LABS:    Chemistry      Component Value Date/Time   NA 141 03/27/2017 0940   K 4.0 03/27/2017 0940   CL 102 03/27/2017 0940   CO2 33 (H) 03/27/2017 0940   BUN 18 03/27/2017 0940   CREATININE 0.62 03/27/2017 0940      Component Value Date/Time   CALCIUM 8.9 03/27/2017 0940   ALKPHOS 70 03/27/2017 0940   AST 16 03/27/2017 0940   ALT 22 03/27/2017 0940   BILITOT 0.8 03/27/2017 0940     Lab Results  Component Value Date   WBC 5.7  03/27/2017   HGB 13.0 03/27/2017   HCT 38.9 03/27/2017   MCV 88.1 03/27/2017   PLT 249.0 03/27/2017   Lab Results  Component Value Date   TSH 1.75 03/27/2017   Lab Results  Component Value Date   ESRSEDRATE 51 (H) 07/20/2010   Lab Results  Component Value Date   RF <10 08/01/2010   Lab Results  Component Value Date   CKTOTAL 31 05/24/2010    IMPRESSION AND PLAN:  1) Fibromyalgia suspected. No sign of inflammatory arthritis.  It is also reassuring that a rheumatologist's evaluation in the past was normal. She has been on duloxetine in the past  but recalls no help from this med. We discussed possible use of gabapentin but she declined at this time. Since NSAIDs help, I'll have her stop her low dose aleve hs and I'll rx celebrex 200 mg once daily. Encouraged exercise but she'll have to wait until her foot surgery heals to get into this.  2) Obesity, high risk med use (phentermine). Tolerates this well.  I printed rx's for phentermine 37.5mg , 1 tab qd, #30, today for this month, June 2019, and July 2019.  Appropriate fill on/after date was noted on each rx.  An After Visit Summary was printed and given to the patient.  FOLLOW UP: Return for Keep appt already scheduled for 09/05/17.  Signed:  Crissie Sickles, MD           06/28/2017

## 2017-07-02 ENCOUNTER — Encounter: Payer: Self-pay | Admitting: Podiatry

## 2017-07-02 DIAGNOSIS — M21541 Acquired clubfoot, right foot: Secondary | ICD-10-CM | POA: Diagnosis not present

## 2017-07-02 DIAGNOSIS — D1631 Benign neoplasm of short bones of right lower limb: Secondary | ICD-10-CM | POA: Diagnosis not present

## 2017-07-02 DIAGNOSIS — I10 Essential (primary) hypertension: Secondary | ICD-10-CM | POA: Diagnosis not present

## 2017-07-02 DIAGNOSIS — M7731 Calcaneal spur, right foot: Secondary | ICD-10-CM | POA: Diagnosis not present

## 2017-07-02 DIAGNOSIS — M216X1 Other acquired deformities of right foot: Secondary | ICD-10-CM | POA: Diagnosis not present

## 2017-07-02 DIAGNOSIS — M25774 Osteophyte, right foot: Secondary | ICD-10-CM | POA: Diagnosis not present

## 2017-07-02 DIAGNOSIS — M2041 Other hammer toe(s) (acquired), right foot: Secondary | ICD-10-CM | POA: Diagnosis not present

## 2017-07-02 DIAGNOSIS — M21611 Bunion of right foot: Secondary | ICD-10-CM | POA: Diagnosis not present

## 2017-07-02 DIAGNOSIS — M2011 Hallux valgus (acquired), right foot: Secondary | ICD-10-CM | POA: Diagnosis not present

## 2017-07-03 ENCOUNTER — Encounter: Payer: Self-pay | Admitting: Family Medicine

## 2017-07-04 ENCOUNTER — Ambulatory Visit: Payer: BLUE CROSS/BLUE SHIELD | Admitting: Family Medicine

## 2017-07-04 ENCOUNTER — Telehealth: Payer: Self-pay | Admitting: Podiatry

## 2017-07-04 NOTE — Telephone Encounter (Signed)
I informed pt, that when anesthesia left a surgery foot it was not all or nothing, it was often patchy, that if her toes were warm as the non-surgery foot and of good color that was normal. I told pt that she needed to continue to rest, ice and elevate, not to dangle or be on the surgery foot more than 15 minutes/hour and to call with concerns.

## 2017-07-04 NOTE — Telephone Encounter (Signed)
I had surgery on Tuesday with Dr. Paulla Dolly. I've noticed that the toes are doing well and are pink and all that stuff but the other thing is I do feel numbness on my little toe as well as the three toes that were not surgically done. I was just wondering if that's normal? Sometimes it feels like I get a sharp pain but its mostly the tingling sensation. I would appreciate a call back at [phone]. Thank you and have a wonderful day.

## 2017-07-10 ENCOUNTER — Ambulatory Visit (INDEPENDENT_AMBULATORY_CARE_PROVIDER_SITE_OTHER): Payer: BLUE CROSS/BLUE SHIELD

## 2017-07-10 ENCOUNTER — Ambulatory Visit (INDEPENDENT_AMBULATORY_CARE_PROVIDER_SITE_OTHER): Payer: BLUE CROSS/BLUE SHIELD | Admitting: Podiatry

## 2017-07-10 VITALS — BP 140/78 | HR 78 | Temp 98.7°F

## 2017-07-10 DIAGNOSIS — M21619 Bunion of unspecified foot: Secondary | ICD-10-CM

## 2017-07-10 DIAGNOSIS — D169 Benign neoplasm of bone and articular cartilage, unspecified: Secondary | ICD-10-CM

## 2017-07-10 DIAGNOSIS — M2041 Other hammer toe(s) (acquired), right foot: Secondary | ICD-10-CM

## 2017-07-10 DIAGNOSIS — M779 Enthesopathy, unspecified: Secondary | ICD-10-CM

## 2017-07-14 NOTE — Progress Notes (Signed)
Subjective:   Patient ID: Laura Green, female   DOB: 62 y.o.   MRN: [id number]   HPI Patient states doing very well with surgery with minimal discomfort swelling and she is very satisfied with the results   ROS      Objective:  Physical Exam  Neurovascular status intact negative Homans sign noted with well-healed surgical sites right with wound edges well coapted good alignment noted and range of motion     Assessment:  Doing well post multiple foot surgeries right foot     Plan:  H&P condition reviewed and x-rays.  Today I reapplied sterile dressing instructed continue compression elevation immobilization will be seen back in the next several weeks and encouraged her to call with any questions that she may have  X-rays indicate osteotomies healing well structure looks good fixation in place in good alignment

## 2017-07-16 ENCOUNTER — Telehealth: Payer: Self-pay | Admitting: *Deleted

## 2017-07-16 DIAGNOSIS — M25474 Effusion, right foot: Secondary | ICD-10-CM | POA: Diagnosis not present

## 2017-07-16 DIAGNOSIS — M79671 Pain in right foot: Secondary | ICD-10-CM | POA: Diagnosis not present

## 2017-07-16 DIAGNOSIS — M79674 Pain in right toe(s): Secondary | ICD-10-CM | POA: Diagnosis not present

## 2017-07-16 DIAGNOSIS — M25674 Stiffness of right foot, not elsewhere classified: Secondary | ICD-10-CM | POA: Diagnosis not present

## 2017-07-16 NOTE — Telephone Encounter (Signed)
Laura Green - [employer] states pt walked in and said Dr. Paulla Dolly wanted her to begin PT.

## 2017-07-18 DIAGNOSIS — M79671 Pain in right foot: Secondary | ICD-10-CM | POA: Diagnosis not present

## 2017-07-18 DIAGNOSIS — M25674 Stiffness of right foot, not elsewhere classified: Secondary | ICD-10-CM | POA: Diagnosis not present

## 2017-07-18 DIAGNOSIS — M79674 Pain in right toe(s): Secondary | ICD-10-CM | POA: Diagnosis not present

## 2017-07-18 DIAGNOSIS — M25474 Effusion, right foot: Secondary | ICD-10-CM | POA: Diagnosis not present

## 2017-07-24 ENCOUNTER — Ambulatory Visit (INDEPENDENT_AMBULATORY_CARE_PROVIDER_SITE_OTHER): Payer: BLUE CROSS/BLUE SHIELD

## 2017-07-24 ENCOUNTER — Ambulatory Visit (INDEPENDENT_AMBULATORY_CARE_PROVIDER_SITE_OTHER): Payer: BLUE CROSS/BLUE SHIELD | Admitting: Podiatry

## 2017-07-24 DIAGNOSIS — D169 Benign neoplasm of bone and articular cartilage, unspecified: Secondary | ICD-10-CM

## 2017-07-24 DIAGNOSIS — M2041 Other hammer toe(s) (acquired), right foot: Secondary | ICD-10-CM

## 2017-07-24 DIAGNOSIS — M21619 Bunion of unspecified foot: Secondary | ICD-10-CM

## 2017-07-24 DIAGNOSIS — M25474 Effusion, right foot: Secondary | ICD-10-CM | POA: Diagnosis not present

## 2017-07-24 DIAGNOSIS — M79671 Pain in right foot: Secondary | ICD-10-CM | POA: Diagnosis not present

## 2017-07-24 DIAGNOSIS — M25674 Stiffness of right foot, not elsewhere classified: Secondary | ICD-10-CM | POA: Diagnosis not present

## 2017-07-24 DIAGNOSIS — M21611 Bunion of right foot: Secondary | ICD-10-CM

## 2017-07-24 DIAGNOSIS — M79674 Pain in right toe(s): Secondary | ICD-10-CM | POA: Diagnosis not present

## 2017-07-24 NOTE — Progress Notes (Signed)
Subjective:   Patient ID: Laura Green, female   DOB: 62 y.o.   MRN: [id number]   HPI Patient presents stating my foot feels good but I am ready to get this pin out   ROS      Objective:  Physical Exam  Neurovascular status intact negative Homans sign noted with patient's foot doing well with good alignment pin in place and first MPJ slightly restricted motion but doing well     Assessment:  Overall doing well after extensive forefoot reconstruction right     Plan:  Dispensed a [employer] brace with instructions on plantar flexing the second toe and I am referring to physical therapy for range of motion exercises first MPJ and stretching of the second MPJ to lower the toe.  Patient will be seen back by me in 3 weeks for pin removal or earlier if needed  X-ray indicates that the osteotomy is healing well good alignment is noted pin in place with good position of the first MPJ

## 2017-07-26 DIAGNOSIS — M79674 Pain in right toe(s): Secondary | ICD-10-CM | POA: Diagnosis not present

## 2017-07-26 DIAGNOSIS — M25674 Stiffness of right foot, not elsewhere classified: Secondary | ICD-10-CM | POA: Diagnosis not present

## 2017-07-26 DIAGNOSIS — M25474 Effusion, right foot: Secondary | ICD-10-CM | POA: Diagnosis not present

## 2017-07-26 DIAGNOSIS — M79671 Pain in right foot: Secondary | ICD-10-CM | POA: Diagnosis not present

## 2017-07-31 DIAGNOSIS — M79671 Pain in right foot: Secondary | ICD-10-CM | POA: Diagnosis not present

## 2017-07-31 DIAGNOSIS — M25474 Effusion, right foot: Secondary | ICD-10-CM | POA: Diagnosis not present

## 2017-07-31 DIAGNOSIS — M25674 Stiffness of right foot, not elsewhere classified: Secondary | ICD-10-CM | POA: Diagnosis not present

## 2017-07-31 DIAGNOSIS — M79674 Pain in right toe(s): Secondary | ICD-10-CM | POA: Diagnosis not present

## 2017-08-02 DIAGNOSIS — M79671 Pain in right foot: Secondary | ICD-10-CM | POA: Diagnosis not present

## 2017-08-02 DIAGNOSIS — M25474 Effusion, right foot: Secondary | ICD-10-CM | POA: Diagnosis not present

## 2017-08-02 DIAGNOSIS — M79674 Pain in right toe(s): Secondary | ICD-10-CM | POA: Diagnosis not present

## 2017-08-02 DIAGNOSIS — M25674 Stiffness of right foot, not elsewhere classified: Secondary | ICD-10-CM | POA: Diagnosis not present

## 2017-08-05 DIAGNOSIS — M79674 Pain in right toe(s): Secondary | ICD-10-CM | POA: Diagnosis not present

## 2017-08-05 DIAGNOSIS — M25674 Stiffness of right foot, not elsewhere classified: Secondary | ICD-10-CM | POA: Diagnosis not present

## 2017-08-05 DIAGNOSIS — M25474 Effusion, right foot: Secondary | ICD-10-CM | POA: Diagnosis not present

## 2017-08-05 DIAGNOSIS — M79671 Pain in right foot: Secondary | ICD-10-CM | POA: Diagnosis not present

## 2017-08-07 ENCOUNTER — Ambulatory Visit (INDEPENDENT_AMBULATORY_CARE_PROVIDER_SITE_OTHER): Payer: BLUE CROSS/BLUE SHIELD

## 2017-08-07 ENCOUNTER — Encounter: Payer: Self-pay | Admitting: Podiatry

## 2017-08-07 ENCOUNTER — Ambulatory Visit (INDEPENDENT_AMBULATORY_CARE_PROVIDER_SITE_OTHER): Payer: BLUE CROSS/BLUE SHIELD | Admitting: Podiatry

## 2017-08-07 DIAGNOSIS — M25474 Effusion, right foot: Secondary | ICD-10-CM | POA: Diagnosis not present

## 2017-08-07 DIAGNOSIS — M2041 Other hammer toe(s) (acquired), right foot: Secondary | ICD-10-CM

## 2017-08-07 DIAGNOSIS — S99921A Unspecified injury of right foot, initial encounter: Secondary | ICD-10-CM

## 2017-08-07 DIAGNOSIS — M79671 Pain in right foot: Secondary | ICD-10-CM | POA: Diagnosis not present

## 2017-08-07 DIAGNOSIS — M79674 Pain in right toe(s): Secondary | ICD-10-CM | POA: Diagnosis not present

## 2017-08-07 DIAGNOSIS — M21619 Bunion of unspecified foot: Secondary | ICD-10-CM

## 2017-08-07 DIAGNOSIS — M25674 Stiffness of right foot, not elsewhere classified: Secondary | ICD-10-CM | POA: Diagnosis not present

## 2017-08-07 NOTE — Progress Notes (Signed)
Subjective:   Patient ID: Laura Green, female   DOB: 62 y.o.   MRN: [id number]   HPI Patient states doing real well with surgery but the pain feels loose   ROS      Objective:  Physical Exam  Neurovascular status intact with forefoot surgery healing well right with mild edema with pin in place second digit right     Assessment:  Doing well post forefoot reconstruction right with pin in place second toe     Plan:  X-ray taken and advised this patient on continued compression elevation and gradual return to soft shoe gear and range of motion exercises.  Reappoint to recheck again in 4 weeks or earlier if needed  X-rays indicate the fixation looks good structural correction looks good

## 2017-08-08 ENCOUNTER — Telehealth: Payer: Self-pay | Admitting: Podiatry

## 2017-08-08 NOTE — Telephone Encounter (Signed)
This message is for Dr. Mellody Drown nurse, Janett Billow. I saw Dr. Paulla Dolly yesterday and sent me a compression sock. What happened is the nurse that was not Janett Billow gave it to me, and its too big so it does not work. I was just wondering if I should return it to you guys and get a new one or what should I do? So if Janett Billow could please give me a call back at [phone]. If I have to return it or pick up a new one I will. So if I can get a phone call back I would appreciate it. Thank you and have a great day. Bye bye.

## 2017-08-12 DIAGNOSIS — M79671 Pain in right foot: Secondary | ICD-10-CM | POA: Diagnosis not present

## 2017-08-12 DIAGNOSIS — M25474 Effusion, right foot: Secondary | ICD-10-CM | POA: Diagnosis not present

## 2017-08-12 DIAGNOSIS — M25674 Stiffness of right foot, not elsewhere classified: Secondary | ICD-10-CM | POA: Diagnosis not present

## 2017-08-12 DIAGNOSIS — M79674 Pain in right toe(s): Secondary | ICD-10-CM | POA: Diagnosis not present

## 2017-08-14 ENCOUNTER — Telehealth: Payer: Self-pay | Admitting: Podiatry

## 2017-08-14 NOTE — Telephone Encounter (Signed)
Laura Green states she assisted pt.

## 2017-08-14 NOTE — Telephone Encounter (Signed)
Patient called and wanted to ask why does her foot continue to swell by the end of the day its the size of a balloon. Not on foot all day mainly is sitting at work. Please contact patient back at [phone] she will be at lunch from 1:30-2:30.

## 2017-08-14 NOTE — Telephone Encounter (Signed)
Not sure what is being asked here. If she is having swelling I need to see her

## 2017-08-15 ENCOUNTER — Encounter: Payer: Self-pay | Admitting: Podiatry

## 2017-08-15 ENCOUNTER — Ambulatory Visit (INDEPENDENT_AMBULATORY_CARE_PROVIDER_SITE_OTHER): Payer: BLUE CROSS/BLUE SHIELD

## 2017-08-15 ENCOUNTER — Ambulatory Visit (INDEPENDENT_AMBULATORY_CARE_PROVIDER_SITE_OTHER): Payer: BLUE CROSS/BLUE SHIELD | Admitting: Podiatry

## 2017-08-15 DIAGNOSIS — M2041 Other hammer toe(s) (acquired), right foot: Secondary | ICD-10-CM | POA: Diagnosis not present

## 2017-08-15 DIAGNOSIS — R600 Localized edema: Secondary | ICD-10-CM

## 2017-08-15 NOTE — Progress Notes (Signed)
Subjective:   Patient ID: Laura Green, female   DOB: 62 y.o.   MRN: [id number]   HPI Patient presents stating her right foot has become quite swollen and hard for her to walk with and she is concerned she may have done something to the bone neurovascular status intact   ROS      Objective:  Physical Exam  Muscle strength adequate patient found to have quite a bit of forefoot edema into the midfoot and ankle right with +1 to +2 pitting edema.  Patient has negative Homans sign noted with no pain in the leg or swelling.     Assessment:  Probability for swelling secondary to activity with possibility for lymphatic or venous issue     Plan:  X-rays reviewed and applied an Unna boot to reduce the swelling in the foot and ankle and advised on elevation and patient will be seen back for [place] to recheck again next week and will continue with physical therapy which is doing a very good job for range of motion  X-ray indicates osteotomy is healing well fixation in place good alignment noted

## 2017-08-19 DIAGNOSIS — M79671 Pain in right foot: Secondary | ICD-10-CM | POA: Diagnosis not present

## 2017-08-19 DIAGNOSIS — M25674 Stiffness of right foot, not elsewhere classified: Secondary | ICD-10-CM | POA: Diagnosis not present

## 2017-08-19 DIAGNOSIS — M25474 Effusion, right foot: Secondary | ICD-10-CM | POA: Diagnosis not present

## 2017-08-19 DIAGNOSIS — M79674 Pain in right toe(s): Secondary | ICD-10-CM | POA: Diagnosis not present

## 2017-08-21 ENCOUNTER — Encounter: Payer: Self-pay | Admitting: Podiatry

## 2017-08-21 ENCOUNTER — Ambulatory Visit (INDEPENDENT_AMBULATORY_CARE_PROVIDER_SITE_OTHER): Payer: BLUE CROSS/BLUE SHIELD | Admitting: Podiatry

## 2017-08-21 ENCOUNTER — Ambulatory Visit (INDEPENDENT_AMBULATORY_CARE_PROVIDER_SITE_OTHER): Payer: BLUE CROSS/BLUE SHIELD

## 2017-08-21 DIAGNOSIS — M2041 Other hammer toe(s) (acquired), right foot: Secondary | ICD-10-CM

## 2017-08-21 DIAGNOSIS — M25474 Effusion, right foot: Secondary | ICD-10-CM | POA: Diagnosis not present

## 2017-08-21 DIAGNOSIS — R600 Localized edema: Secondary | ICD-10-CM

## 2017-08-21 DIAGNOSIS — M79671 Pain in right foot: Secondary | ICD-10-CM | POA: Diagnosis not present

## 2017-08-21 DIAGNOSIS — M25674 Stiffness of right foot, not elsewhere classified: Secondary | ICD-10-CM | POA: Diagnosis not present

## 2017-08-21 DIAGNOSIS — M79674 Pain in right toe(s): Secondary | ICD-10-CM | POA: Diagnosis not present

## 2017-08-22 NOTE — Progress Notes (Signed)
Subjective:   Patient ID: Laura Green, female   DOB: 62 y.o.   MRN: [id number]   HPI Patient states the swelling has gone down quite a bit and she still has discomfort but it is improving   ROS      Objective:  Physical Exam  Neurovascular status intact with patient's right foot doing very well with minimal discomfort mild swelling and negative Homans sign noted     Assessment:  Patient continues to improve with significant improvement in swelling with compression of the Unna boot     Plan:  Reviewed x-ray and at this point we are not in a recompressed but it may be necessary at one point future and she will continue with elevation home compression and tennis shoe usage.  Reappoint in 4 weeks or earlier if needed  X-ray indicates osteotomies are healing well good positional component with good alignment noted and fixation in place

## 2017-09-05 ENCOUNTER — Ambulatory Visit: Payer: BLUE CROSS/BLUE SHIELD | Admitting: Family Medicine

## 2017-09-09 ENCOUNTER — Other Ambulatory Visit: Payer: BLUE CROSS/BLUE SHIELD

## 2017-09-23 ENCOUNTER — Ambulatory Visit (INDEPENDENT_AMBULATORY_CARE_PROVIDER_SITE_OTHER): Payer: BLUE CROSS/BLUE SHIELD

## 2017-09-23 ENCOUNTER — Encounter: Payer: Self-pay | Admitting: Podiatry

## 2017-09-23 ENCOUNTER — Ambulatory Visit (INDEPENDENT_AMBULATORY_CARE_PROVIDER_SITE_OTHER): Payer: BLUE CROSS/BLUE SHIELD | Admitting: Podiatry

## 2017-09-23 DIAGNOSIS — M779 Enthesopathy, unspecified: Secondary | ICD-10-CM | POA: Diagnosis not present

## 2017-09-23 DIAGNOSIS — M2041 Other hammer toe(s) (acquired), right foot: Secondary | ICD-10-CM

## 2017-09-23 NOTE — Progress Notes (Signed)
Subjective:   Patient ID: Laura Green, female   DOB: 62 y.o.   MRN: [id number]   HPI Patient presents stating doing pretty well with the right foot with mild swelling still noted and no current numbness with developing discomfort in the outside of the right foot that she was concerned about   ROS      Objective:  Physical Exam  Neurovascular status intact muscle strength was found to be adequate with patient doing very well postoperatively right foot with good alignment noted excellent range of motion of the first MPJ.  There is moderate inflammation at the insertion of the tendon into the base of the fifth metatarsal right     Assessment:  Doing well overall with moderate inflammation tendon group     Plan:  H&P condition reviewed and recommended that we utilize ice therapy and supportive shoes and I may have to put medicine into the outside of the foot.  For the inside of the foot patient's discharge is allowed to return to normal activities  X-ray indicates the osteotomy is healing well with good alignment noted good structural position with fixation in place

## 2017-09-24 ENCOUNTER — Other Ambulatory Visit: Payer: Self-pay | Admitting: Podiatry

## 2017-09-24 DIAGNOSIS — M2041 Other hammer toe(s) (acquired), right foot: Secondary | ICD-10-CM

## 2017-09-27 ENCOUNTER — Other Ambulatory Visit: Payer: Self-pay | Admitting: Family Medicine

## 2017-09-27 NOTE — Telephone Encounter (Signed)
RX refill request for Celebrex.  Last Rx states 06/28/17 # 30 x 2 rfs.  Please advise.

## 2017-09-30 ENCOUNTER — Other Ambulatory Visit: Payer: BLUE CROSS/BLUE SHIELD

## 2017-10-24 ENCOUNTER — Encounter: Payer: Self-pay | Admitting: Podiatry

## 2017-10-24 NOTE — Progress Notes (Signed)
Medical records including billing statements requested by [facility] were placed up front to be mailed out to them in a large manila envelope either today, Thursday 29 August or tomorrow, Friday 30 August. Records were mailed to the following address:  [employer] [address] [place], [place] [address]

## 2017-11-06 ENCOUNTER — Ambulatory Visit: Payer: BLUE CROSS/BLUE SHIELD | Admitting: Family Medicine

## 2017-11-11 ENCOUNTER — Other Ambulatory Visit: Payer: Self-pay | Admitting: Family Medicine

## 2017-11-11 DIAGNOSIS — Z1231 Encounter for screening mammogram for malignant neoplasm of breast: Secondary | ICD-10-CM | POA: Diagnosis not present

## 2017-11-11 DIAGNOSIS — I10 Essential (primary) hypertension: Secondary | ICD-10-CM

## 2017-11-11 LAB — HM MAMMOGRAPHY

## 2017-12-03 ENCOUNTER — Ambulatory Visit: Payer: BLUE CROSS/BLUE SHIELD | Admitting: Family Medicine

## 2017-12-11 ENCOUNTER — Ambulatory Visit (INDEPENDENT_AMBULATORY_CARE_PROVIDER_SITE_OTHER): Payer: BLUE CROSS/BLUE SHIELD

## 2017-12-11 ENCOUNTER — Encounter: Payer: Self-pay | Admitting: Podiatry

## 2017-12-11 ENCOUNTER — Ambulatory Visit (INDEPENDENT_AMBULATORY_CARE_PROVIDER_SITE_OTHER): Payer: BLUE CROSS/BLUE SHIELD | Admitting: Podiatry

## 2017-12-11 DIAGNOSIS — M7671 Peroneal tendinitis, right leg: Secondary | ICD-10-CM | POA: Diagnosis not present

## 2017-12-11 DIAGNOSIS — M2041 Other hammer toe(s) (acquired), right foot: Secondary | ICD-10-CM

## 2017-12-11 DIAGNOSIS — B372 Candidiasis of skin and nail: Secondary | ICD-10-CM | POA: Insufficient documentation

## 2017-12-11 MED ORDER — TRIAMCINOLONE ACETONIDE 10 MG/ML IJ SUSP
10.0000 mg | Freq: Once | INTRAMUSCULAR | Status: AC
  Administered 2017-12-11: 10 mg

## 2017-12-11 NOTE — Progress Notes (Signed)
Subjective:   Patient ID: Laura Green, female   DOB: 62 y.o.   MRN: [id number]   HPI Patient states and doing well with the inside of my right foot but I am still having a lot of pain in the outside of the right foot and it feels very inflamed on the tendon   ROS      Objective:  Physical Exam  Neurovascular status intact negative Homans sign noted with patient found to have discomfort in the perineal base fifth metatarsal insertion into the metatarsal with inflammation around the area     Assessment:  Doing well post surgery right with inflammation of the lateral side that is not gotten better over the last few months despite walking more on the medial side of her foot perineal inflammation     Plan:  Reviewed condition reviewed x-ray and today I did a careful sheath injection of the peroneal tendon 3 mg Kenalog 5 mg Xylocaine at its insertion and applied fascial brace to lift the lateral side of the foot along with ice therapy.  Reappoint to recheck in the next 4 to 6 weeks and x-rays taken today indicated good healing of the osteotomy with good alignment noted fixation in place

## 2017-12-28 ENCOUNTER — Other Ambulatory Visit: Payer: Self-pay | Admitting: Family Medicine

## 2017-12-30 ENCOUNTER — Encounter: Payer: Self-pay | Admitting: *Deleted

## 2017-12-30 ENCOUNTER — Ambulatory Visit (INDEPENDENT_AMBULATORY_CARE_PROVIDER_SITE_OTHER): Payer: BLUE CROSS/BLUE SHIELD | Admitting: Family Medicine

## 2017-12-30 ENCOUNTER — Encounter: Payer: Self-pay | Admitting: Family Medicine

## 2017-12-30 VITALS — BP 126/77 | HR 81 | Temp 98.2°F | Resp 16 | Ht 63.5 in | Wt 188.4 lb

## 2017-12-30 DIAGNOSIS — I10 Essential (primary) hypertension: Secondary | ICD-10-CM

## 2017-12-30 DIAGNOSIS — M797 Fibromyalgia: Secondary | ICD-10-CM | POA: Diagnosis not present

## 2017-12-30 DIAGNOSIS — K219 Gastro-esophageal reflux disease without esophagitis: Secondary | ICD-10-CM

## 2017-12-30 DIAGNOSIS — R0789 Other chest pain: Secondary | ICD-10-CM

## 2017-12-30 DIAGNOSIS — E669 Obesity, unspecified: Secondary | ICD-10-CM | POA: Diagnosis not present

## 2017-12-30 MED ORDER — PHENTERMINE HCL 37.5 MG PO TABS: ORAL_TABLET | ORAL | 0 refills | Status: DC

## 2017-12-30 NOTE — Patient Instructions (Signed)
Take over the counter, generic PEPCID 20mg ; 1 tab every morning and 1 tab every evening.

## 2017-12-30 NOTE — Telephone Encounter (Signed)
RF request for celecoxib LOV: 12/30/17 Next ov: 04/11/18 Last written: 09/27/17 #90 w/ 0NL  Please advise. Thanks.

## 2017-12-30 NOTE — Progress Notes (Signed)
OFFICE VISIT  12/30/2017   CC:  Chief Complaint  Patient presents with  . Follow-up    RCI, pt is not fasting.      HPI:    Patient is a 62 y.o. Caucasian female who presents for f/u HTN, fibromyalgia syndrome, and obesity.  Fibromyalgia: last f/u visit we d/c'd her aleve and tried a once daily dose of celebrex. She noted a significant improvement the first couple of months.  Still with some persistent bilat base of thumb pains--she types all day.  Not anything else in the way of fibro pain lately.  HTN: Checks bp [facility] when she picks up rx's: consistently <130/80.  Obesity: taking phentermine long term.  Controlled substance contract done today. Chronic foot pain/recent foot surgery has kept her from doing significant CV exercise. Trying to walk regularly.  Diets sporadically.  4 nights ago she got a sudden mid epigastric, sharp pain that radiated up into R side of chest.  She was lying on her side. She felt nervous and got diaphoretic after/during.  No palpitations or SOB.  No dizziness.  It subsided in about 10 min. She took a baby ASA.  No s'xs since that time. Takes gas ex every once in a while.  ROS:  no wheezing, no cough, no dizziness, no HAs, no rashes, no melena/hematochezia.  No polyuria or polydipsia.   Past Medical History:  Diagnosis Date  . Asthma   . Borderline hyperlipidemia 2017/2018   Her 10 yr Framingham risk = 5% in 2018.  LDL improved 02/2017 (on no meds).  . Breast mass, right 07/08/14   Two 7 mm masses, ultrasound impression "low suspicion of malignancy" bx showed benign fibrocystic breast tissue; repeat R mammo 01/12/15 NEG-- Repeat diagnostic R breast mammo recommended 6 mo  . Colon cancer screening 03/2016   Cologuard negative.  Repeat 03/2019.  Marland Kitchen Concussion with no loss of consciousness 04/01/2008  . Depression   . Hypertension   . Obesity   . Perennial allergic rhinitis   . Polyarthralgia    Dr. Ouida Sills did rheum w/u which was neg approx  2012--all this got better with wt loss  . Right foot pain    Hammertoe, bunion, and dorsal spur to be treated surgically by podiatrist as of spring 2019    Past Surgical History:  Procedure Laterality Date  . CESAREAN SECTION     x 2  . 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  Plan repeat 2 yrs.  . left foot surgery     2nd toe dislocation repair + bunion repair ([facility])  . ROTATOR CUFF REPAIR  Approx 2009   Right    Outpatient Medications Prior to Visit  Medication Sig Dispense Refill  . albuterol (PROVENTIL HFA) 108 (90 Base) MCG/ACT inhaler Inhale 2 puffs into the lungs every 4 (four) hours as needed. 1 Inhaler 1  . Beclomethasone Diprop HFA (QVAR REDIHALER) 80 MCG/ACT AERB Inhale 2 puffs into the lungs 2 (two) times daily. 1 Inhaler 11  . Calcium Carbonate-Vitamin D (CALTRATE 600+D) 600-400 MG-UNIT per chew tablet Chew 1 tablet by mouth daily.      . celecoxib (CELEBREX) 200 MG capsule TAKE 1 CAPSULE BY MOUTH EVERY DAY 90 capsule 0  . cetirizine (ZYRTEC) 10 MG tablet Take 10 mg by mouth daily.    . irbesartan-hydrochlorothiazide (AVALIDE) 150-12.5 MG tablet TAKE 1 TABLET BY MOUTH EVERY DAY 90 tablet 1  . Multiple Vitamin (MULTIVITAMIN) capsule Take 1 capsule by mouth  daily.      . phentermine (ADIPEX-P) 37.5 MG tablet TAKE 1 TABLET EVERY DAY BEFORE BREAKFAST 30 tablet 0  . vitamin E 100 UNIT capsule Take 100 Units by mouth daily.      . fluticasone (FLONASE) 50 MCG/ACT nasal spray Place 2 sprays into both nostrils daily. 16 g 6  . trimethoprim-polymyxin b (POLYTRIM) ophthalmic solution Place 2 drops into the right eye every 4 (four) hours. (Patient not taking: Reported on 12/30/2017) 10 mL 0   No facility-administered medications prior to visit.     Allergies  Allergen Reactions  . Augmentin [Amoxicillin-Pot Clavulanate] Rash    ROS As per HPI  PE: Blood pressure 126/77, pulse 81, temperature 98.2 F (36.8 C), temperature source Oral, resp. rate 16, height 5'  3.5" (1.613 m), weight 188 lb 6 oz (85.4 kg), SpO2 98 %. Wt Readings from Last 2 Encounters:  12/30/17 188 lb 6 oz (85.4 kg)  06/28/17 189 lb 2 oz (85.8 kg)    Gen: alert, oriented x 4, affect pleasant.  Lucid thinking and conversation noted. HEENT: PERRLA, EOMI.   Neck: no LAD, mass, or thyromegaly. CV: RRR, no m/r/g LUNGS: CTA bilat, nonlabored. NEURO: no tremor or tics noted on observation.  Coordination intact. CN 2-12 grossly intact bilaterally, strength 5/5 in all extremeties.  No ataxia.   LABS:  Lab Results  Component Value Date   TSH 1.75 03/27/2017   Lab Results  Component Value Date   WBC 5.7 03/27/2017   HGB 13.0 03/27/2017   HCT 38.9 03/27/2017   MCV 88.1 03/27/2017   PLT 249.0 03/27/2017   Lab Results  Component Value Date   CREATININE 0.62 03/27/2017   BUN 18 03/27/2017   NA 141 03/27/2017   K 4.0 03/27/2017   CL 102 03/27/2017   CO2 33 (H) 03/27/2017   Lab Results  Component Value Date   ALT 22 03/27/2017   AST 16 03/27/2017   ALKPHOS 70 03/27/2017   BILITOT 0.8 03/27/2017   Lab Results  Component Value Date   CHOL 234 (H) 03/27/2017   Lab Results  Component Value Date   HDL 69.30 03/27/2017   Lab Results  Component Value Date   LDLCALC 149 (H) 03/27/2017   Lab Results  Component Value Date   TRIG 80.0 03/27/2017   Lab Results  Component Value Date   CHOLHDL 3 03/27/2017    IMPRESSION AND PLAN:  1) Epigastric/atypical CP: suspect GERD/esophageal pain. Start Pepcid 20mg  bid, watch diet. Signs/symptoms to call or return for were reviewed and pt expressed understanding.  2) HTN: The current medical regimen is effective;  continue present plan and medications. Lytes/cr consistently stable: ok to wait and monitor these at next visit for CPE in 3 mo.  3) Obesity: foot pain preventing much CV/calorie-burning exercise. She struggles with diet, but finds that phentermine does help suppress appetite and is likely preventing too much wt  gain at this time. Controlled substance contract reviewed with patient today.  Patient signed this and it will be placed in the chart.   I did electronic rx's for phentermine 37.5mg , 1 qd, #30 today for this month, Dec 2019, and Jan 2020.  Appropriate fill on/after date was noted on each rx.  4) Fibromyalgia: improved the last 3 mo. Continue celebrex 200 mg qd prn.  An After Visit Summary was printed and given to the patient.  FOLLOW UP: No follow-ups on file.3 mo cpe  Signed:  Crissie Sickles, MD  12/30/2017     

## 2018-03-05 ENCOUNTER — Ambulatory Visit (INDEPENDENT_AMBULATORY_CARE_PROVIDER_SITE_OTHER): Payer: BLUE CROSS/BLUE SHIELD

## 2018-03-05 ENCOUNTER — Encounter: Payer: Self-pay | Admitting: Podiatry

## 2018-03-05 ENCOUNTER — Ambulatory Visit (INDEPENDENT_AMBULATORY_CARE_PROVIDER_SITE_OTHER): Payer: BLUE CROSS/BLUE SHIELD | Admitting: Podiatry

## 2018-03-05 ENCOUNTER — Other Ambulatory Visit: Payer: Self-pay | Admitting: Podiatry

## 2018-03-05 DIAGNOSIS — M2041 Other hammer toe(s) (acquired), right foot: Secondary | ICD-10-CM

## 2018-03-05 DIAGNOSIS — M778 Other enthesopathies, not elsewhere classified: Secondary | ICD-10-CM

## 2018-03-05 DIAGNOSIS — M7751 Other enthesopathy of right foot: Secondary | ICD-10-CM

## 2018-03-05 DIAGNOSIS — M779 Enthesopathy, unspecified: Secondary | ICD-10-CM | POA: Diagnosis not present

## 2018-03-05 MED ORDER — TRIAMCINOLONE ACETONIDE 10 MG/ML IJ SUSP
10.0000 mg | Freq: Once | INTRAMUSCULAR | Status: AC
  Administered 2018-03-05: 10 mg

## 2018-03-05 NOTE — Progress Notes (Signed)
Subjective:   Patient ID: Laura Green, female   DOB: 63 y.o.   MRN: [id number]   HPI Patient states the right midfoot is painful in the area we did the surgery seems to be progressing well but she does have some numbness and this all occurred secondary to an accident that occurred at [place]      Objective:  Physical Exam  Neurovascular status intact with patient second digit in a much better position structural bunion much better after having had a acute flexor plate dislocation second MPJ secondary to injury sustained [employer].  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 with extensor tendinitis and probability for low-grade arthritic symptomatology in the midfoot     Assessment:  Midfoot tendinitis secondary to probable arthritis with healing of previous surgical sites from injury that she had sustained with good alignment noted and mild numbness     Plan:  I do believe the numbness will gradually go away over the next 6 months and today I did do a sterile prep of the left midfoot and injected the sterile tendon complex 3 mg Kenalog 5 mg like and advised on reduced activity ice therapy wide shoes and will be seen back in 3 months to reevaluate  X-ray indicates good structural alignment with good positional component all of which was conveyed to the patient

## 2018-04-11 ENCOUNTER — Ambulatory Visit (INDEPENDENT_AMBULATORY_CARE_PROVIDER_SITE_OTHER): Payer: BLUE CROSS/BLUE SHIELD | Admitting: Family Medicine

## 2018-04-11 ENCOUNTER — Encounter: Payer: Self-pay | Admitting: Family Medicine

## 2018-04-11 VITALS — BP 115/78 | HR 84 | Temp 97.8°F | Resp 16 | Ht 63.25 in | Wt 189.2 lb

## 2018-04-11 DIAGNOSIS — E7800 Pure hypercholesterolemia, unspecified: Secondary | ICD-10-CM | POA: Diagnosis not present

## 2018-04-11 DIAGNOSIS — E669 Obesity, unspecified: Secondary | ICD-10-CM | POA: Diagnosis not present

## 2018-04-11 DIAGNOSIS — Z0000 Encounter for general adult medical examination without abnormal findings: Secondary | ICD-10-CM

## 2018-04-11 DIAGNOSIS — I10 Essential (primary) hypertension: Secondary | ICD-10-CM

## 2018-04-11 LAB — COMPREHENSIVE METABOLIC PANEL
ALT: 17 U/L (ref 0–35)
AST: 15 U/L (ref 0–37)
Albumin: 3.4 g/dL — ABNORMAL LOW (ref 3.5–5.2)
Alkaline Phosphatase: 72 U/L (ref 39–117)
BUN: 16 mg/dL (ref 6–23)
CHLORIDE: 104 meq/L (ref 96–112)
CO2: 31 mEq/L (ref 19–32)
Calcium: 8.9 mg/dL (ref 8.4–10.5)
Creatinine, Ser: 0.67 mg/dL (ref 0.40–1.20)
GFR: 88.78 mL/min (ref >60.00)
Glucose, Bld: 88 mg/dL (ref 70–99)
Potassium: 3.9 mEq/L (ref 3.5–5.1)
Sodium: 142 mEq/L (ref 135–145)
Total Bilirubin: 0.6 mg/dL (ref 0.2–1.2)
Total Protein: 6.5 g/dL (ref 6.0–8.3)

## 2018-04-11 LAB — CBC WITH DIFFERENTIAL/PLATELET
Basophils Absolute: 0 10*3/uL (ref 0.0–0.1)
Basophils Relative: 0.6 % (ref 0.0–3.0)
Eosinophils Absolute: 0.1 10*3/uL (ref 0.0–0.7)
Eosinophils Relative: 1.7 % (ref 0.0–5.0)
HEMATOCRIT: 38.1 % (ref 36.0–46.0)
Hemoglobin: 12.6 g/dL (ref 12.0–15.0)
Lymphocytes Relative: 17.6 % (ref 12.0–46.0)
Lymphs Abs: 1 10*3/uL (ref 0.7–4.0)
MCHC: 33 g/dL (ref 30.0–36.0)
MCV: 87.6 fl (ref 78.0–100.0)
Monocytes Absolute: 0.4 10*3/uL (ref 0.1–1.0)
Monocytes Relative: 7.2 % (ref 3.0–12.0)
Neutro Abs: 4 10*3/uL (ref 1.4–7.7)
Neutrophils Relative %: 72.9 % (ref 43.0–77.0)
PLATELETS: 255 10*3/uL (ref 150.0–400.0)
RBC: 4.35 Mil/uL (ref 3.87–5.11)
RDW: 13.1 % (ref 11.5–15.5)
WBC: 5.4 10*3/uL (ref 4.0–10.5)

## 2018-04-11 LAB — LIPID PANEL
Cholesterol: 223 mg/dL — ABNORMAL HIGH (ref 0–200)
HDL: 66.5 mg/dL (ref >39.00)
LDL CALC: 141 mg/dL — AB (ref 0–99)
NonHDL: 156.34
Total CHOL/HDL Ratio: 3
Triglycerides: 76 mg/dL (ref 0.0–149.0)
VLDL: 15.2 mg/dL (ref 0.0–40.0)

## 2018-04-11 LAB — TSH: TSH: 2.28 u[IU]/mL (ref 0.35–4.50)

## 2018-04-11 MED ORDER — IRBESARTAN-HYDROCHLOROTHIAZIDE 150-12.5 MG PO TABS: 1.0000 | ORAL_TABLET | Freq: Every day | ORAL | 3 refills | Status: DC

## 2018-04-11 MED ORDER — PHENTERMINE HCL 37.5 MG PO TABS: ORAL_TABLET | ORAL | 0 refills | Status: DC

## 2018-04-11 NOTE — Progress Notes (Signed)
Office Note 04/11/2018  CC:  Chief Complaint  Patient presents with  . Annual Exam    Pt is fasting.    HPI:  Laura Green is a 63 y.o. White female who is here for annual health maintenance exam. GYN MD: Dr. Paula Compton.  Having epigastric discomfort this morning.  Uses prevacid prn but not today. Some nausea this morning and lightheaded feeling but this resolved after her shower. No recurrent prob's with postprandial nausea or abd pain.  She is staying active all the time. She is high strung. She is apprehensive about the thought of retiring b/c.   Past Medical History:  Diagnosis Date  . Asthma   . Borderline hyperlipidemia 2017/2018   Her 10 yr Framingham risk = 5% in 2018.  LDL improved 02/2017 (on no meds).  . Breast mass, right 07/08/14   Two 7 mm masses, ultrasound impression "low suspicion of malignancy" bx showed benign fibrocystic breast tissue; repeat R mammo 01/12/15 NEG-- Repeat diagnostic R breast mammo recommended 6 mo  . Colon cancer screening 03/2016   Cologuard negative.  Repeat 03/2019.  Marland Kitchen Concussion with no loss of consciousness 04/01/2008  . Depression   . Hypertension   . Obesity   . Perennial allergic rhinitis   . Polyarthralgia    Dr. Ouida Sills did rheum w/u which was neg approx 2012--all this got better with wt loss  . Right foot pain    Hammertoe, bunion, and dorsal spur to be treated surgically by podiatrist as of spring 2019    Past Surgical History:  Procedure Laterality Date  . CESAREAN SECTION     x 2  . 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  Plan repeat 2 yrs.  . left foot surgery     2nd toe dislocation repair + bunion repair ([facility])  . ROTATOR CUFF REPAIR  Approx 2009   Right    Family History  Problem Relation Age of Onset  . Arthritis Mother   . Colon cancer Mother   . COPD Father   . Hyperlipidemia Father     Social History   Socioeconomic History  . Marital status: Married    Spouse name: Not  on file  . Number of children: Not on file  . Years of education: Not on file  . Highest education level: Not on file  Occupational History  . Not on file  Social Needs  . Financial resource strain: Not on file  . Food insecurity:    Worry: Not on file    Inability: Not on file  . Transportation needs:    Medical: Not on file    Non-medical: Not on file  Tobacco Use  . Smoking status: Never Smoker  . Smokeless tobacco: Never Used  Substance and Sexual Activity  . Alcohol use: No  . Drug use: No  . Sexual activity: Yes  Lifestyle  . Physical activity:    Days per week: Not on file    Minutes per session: Not on file  . Stress: Not on file  Relationships  . Social connections:    Talks on phone: Not on file    Gets together: Not on file    Attends religious service: Not on file    Active member of club or organization: Not on file    Attends meetings of clubs or organizations: Not on file    Relationship status: Not on file  . Intimate partner violence:    Fear of  current or ex partner: Not on file    Emotionally abused: Not on file    Physically abused: Not on file    Forced sexual activity: Not on file  Other Topics Concern  . Not on file  Social History Narrative   Married 40+ yrs, two daughters, 3 grandchildren.Marland Kitchen   [place] prior to [place] since [place]s.   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   No T/A/Ds.   Exercise: Zumba weekly.    Outpatient Medications Prior to Visit  Medication Sig Dispense Refill  . albuterol (PROVENTIL HFA) 108 (90 Base) MCG/ACT inhaler Inhale 2 puffs into the lungs every 4 (four) hours as needed. 1 Inhaler 1  . Beclomethasone Diprop HFA (QVAR REDIHALER) 80 MCG/ACT AERB Inhale 2 puffs into the lungs 2 (two) times daily. 1 Inhaler 11  . Calcium Carbonate-Vitamin D (CALTRATE 600+D) 600-400 MG-UNIT per chew tablet Chew 1 tablet by mouth daily.      . celecoxib (CELEBREX) 200 MG capsule TAKE 1 CAPSULE BY MOUTH  EVERY DAY 90 capsule 1  . cetirizine (ZYRTEC) 10 MG tablet Take 10 mg by mouth daily.    Marland Kitchen FAMOTIDINE PO Take 1 tablet by mouth daily as needed.    . Multiple Vitamin (MULTIVITAMIN) capsule Take 1 capsule by mouth daily.      Marland Kitchen olopatadine (PATANOL) 0.1 % ophthalmic solution Place 1 drop into both eyes daily as needed.    . vitamin E 100 UNIT capsule Take 100 Units by mouth daily.      . irbesartan-hydrochlorothiazide (AVALIDE) 150-12.5 MG tablet TAKE 1 TABLET BY MOUTH EVERY DAY 90 tablet 1  . phentermine (ADIPEX-P) 37.5 MG tablet TAKE 1 TABLET EVERY DAY BEFORE BREAKFAST 30 tablet 0  . fluticasone (FLONASE) 50 MCG/ACT nasal spray Place 2 sprays into both nostrils daily. 16 g 6   No facility-administered medications prior to visit.     Allergies  Allergen Reactions  . Augmentin [Amoxicillin-Pot Clavulanate] Rash    ROS Review of Systems  Constitutional: Negative for appetite change, chills, fatigue and fever.  HENT: Negative for congestion, dental problem, ear pain and sore throat.   Eyes: Negative for discharge, redness and visual disturbance.  Respiratory: Negative for cough, chest tightness, shortness of breath and wheezing.   Cardiovascular: Negative for chest pain, palpitations and leg swelling.  Gastrointestinal: Positive for abdominal pain. Negative for blood in stool, diarrhea, nausea and vomiting.  Genitourinary: Negative for difficulty urinating, dysuria, flank pain, frequency, hematuria and urgency.  Musculoskeletal: Negative for arthralgias, back pain, joint swelling, myalgias and neck stiffness.  Skin: Negative for pallor and rash.  Neurological: Positive for dizziness. Negative for speech difficulty, weakness and headaches.  Hematological: Negative for adenopathy. Does not bruise/bleed easily.  Psychiatric/Behavioral: Negative for confusion and sleep disturbance. The patient is not nervous/anxious.     PE; Blood pressure 115/78, pulse 84, temperature 97.8 F (36.6 C),  temperature source Oral, resp. rate 16, height 5' 3.25" (1.607 m), weight 189 lb 4 oz (85.8 kg), SpO2 98 %. Body mass index is 33.26 kg/m. Pt examined with Helayne Seminole, CMA, as chaperone.  Gen: Alert, well appearing.  Patient is oriented to person, place, time, and situation. AFFECT: pleasant, lucid thought and speech. ENT: Ears: EACs clear, normal epithelium.  TMs with good light reflex and landmarks bilaterally.  Eyes: no injection, icteris, swelling, or exudate.  EOMI, PERRLA. Nose: no drainage or turbinate edema/swelling.  No injection or focal lesion.  Mouth: lips without lesion/swelling.  Oral mucosa  pink and moist.  Dentition intact and without obvious caries or gingival swelling.  Oropharynx without erythema, exudate, or swelling.  Neck: supple/nontender.  No LAD, mass, or TM.  Carotid pulses 2+ bilaterally, without bruits. CV: RRR, no m/r/g.   LUNGS: CTA bilat, nonlabored resps, good aeration in all lung fields. ABD: soft, NT, ND, mild subxyphoid discomfort to palpation. BS slightly hyperactive.  No hepatospenomegaly or mass.  No bruits. EXT: no clubbing, cyanosis, or edema.  Musculoskeletal: no joint swelling, erythema, warmth, or tenderness.  ROM of all joints intact. Skin - no sores or suspicious lesions or rashes or color changes   Pertinent labs:  Lab Results  Component Value Date   TSH 1.75 03/27/2017   Lab Results  Component Value Date   WBC 5.7 03/27/2017   HGB 13.0 03/27/2017   HCT 38.9 03/27/2017   MCV 88.1 03/27/2017   PLT 249.0 03/27/2017   Lab Results  Component Value Date   CREATININE 0.62 03/27/2017   BUN 18 03/27/2017   NA 141 03/27/2017   K 4.0 03/27/2017   CL 102 03/27/2017   CO2 33 (H) 03/27/2017   Lab Results  Component Value Date   ALT 22 03/27/2017   AST 16 03/27/2017   ALKPHOS 70 03/27/2017   BILITOT 0.8 03/27/2017   Lab Results  Component Value Date   CHOL 234 (H) 03/27/2017   Lab Results  Component Value Date   HDL 69.30  03/27/2017   Lab Results  Component Value Date   LDLCALC 149 (H) 03/27/2017   Lab Results  Component Value Date   TRIG 80.0 03/27/2017   Lab Results  Component Value Date   CHOLHDL 3 03/27/2017     ASSESSMENT AND PLAN:   Health maintenance exam: Reviewed age and gender appropriate health maintenance issues (prudent diet, regular exercise, health risks of tobacco and excessive alcohol, use of seatbelts, fire alarms in home, use of sunscreen).  Also reviewed age and gender appropriate health screening as well as vaccine recommendations. Vaccines: all UTD, including shingrix. Labs: fasting HP today. Cervical ca screening: pap UTD (GYN: Dr. Paula Compton). Breast ca screening: GYN-->done summer 2019. Colon ca screening: due for repeat cologuard 03/2019.  GI sx's, with brief dizziness this morning prior to coming in: I think she is having some GER/mild gastritis sx's, + anxiety.  Encouraged pt to start her prevacid daily for a week, call if sx's worsen or new problems.  An After Visit Summary was printed and given to the patient.  FOLLOW UP:  Return in about 6 months (around 10/10/2018) for routine chronic illness f/u.  Signed:  Crissie Sickles, MD           04/11/2018

## 2018-04-11 NOTE — Patient Instructions (Signed)

## 2018-04-13 ENCOUNTER — Encounter: Payer: Self-pay | Admitting: Family Medicine

## 2018-04-14 ENCOUNTER — Encounter: Payer: Self-pay | Admitting: Family Medicine

## 2018-05-12 ENCOUNTER — Other Ambulatory Visit: Payer: Self-pay | Admitting: Family Medicine

## 2018-05-12 NOTE — Telephone Encounter (Signed)
Copied from [place] [id number]. Topic: Quick Communication - Rx Refill/Question >> May 12, 2018  4:34 PM Blase Mess A wrote: Medication: Beclomethasone Diprop HFA (QVAR REDIHALER) 80 MCG/ACT AERB [[id number]]   Has the patient contacted their pharmacy? Yes  (Agent: If no, request that the patient contact the pharmacy for the refill.) (Agent: If yes, when and what did the pharmacy advise?)  Preferred Pharmacy (with phone number or street name): [facility] [place], [place] [phone] (Phone) [phone] (Fax)    Agent: Please be advised that RX refills may take up to 3 business days. We ask that you follow-up with your pharmacy.

## 2018-05-13 MED ORDER — BECLOMETHASONE DIPROP HFA 80 MCG/ACT IN AERB: 2.0000 | INHALATION_SPRAY | Freq: Two times a day (BID) | RESPIRATORY_TRACT | 6 refills | Status: DC

## 2018-06-04 ENCOUNTER — Ambulatory Visit: Payer: BLUE CROSS/BLUE SHIELD | Admitting: Podiatry

## 2018-07-09 ENCOUNTER — Other Ambulatory Visit: Payer: Self-pay

## 2018-07-09 ENCOUNTER — Ambulatory Visit (INDEPENDENT_AMBULATORY_CARE_PROVIDER_SITE_OTHER): Payer: BLUE CROSS/BLUE SHIELD | Admitting: Podiatry

## 2018-07-09 ENCOUNTER — Ambulatory Visit (INDEPENDENT_AMBULATORY_CARE_PROVIDER_SITE_OTHER): Payer: BLUE CROSS/BLUE SHIELD

## 2018-07-09 ENCOUNTER — Encounter: Payer: Self-pay | Admitting: Podiatry

## 2018-07-09 VITALS — Temp 97.9°F

## 2018-07-09 DIAGNOSIS — M2041 Other hammer toe(s) (acquired), right foot: Secondary | ICD-10-CM

## 2018-07-09 DIAGNOSIS — M779 Enthesopathy, unspecified: Secondary | ICD-10-CM

## 2018-07-09 MED ORDER — TRIAMCINOLONE ACETONIDE 10 MG/ML IJ SUSP
10.0000 mg | Freq: Once | INTRAMUSCULAR | Status: AC
  Administered 2018-07-09: 10 mg

## 2018-07-09 NOTE — Progress Notes (Signed)
Subjective:   Patient ID: Laura Green, female   DOB: 63 y.o.   MRN: [id number]   HPI Patient presents stating she seems to really be improving continuously from her previous surgery that was done last year with mild swelling only upon prolonged weightbearing but is having a lot of pain in her ankle now and states the top of her foot still hurts   ROS      Objective:  Physical Exam  Neurovascular status intact with patient surgical sites right doing well with good alignment noted wound edges well coapted and good range of motion of the first MPJ the patient's right sinus tarsi is quite inflamed and sore with mild pain on the dorsal extensor surface     Assessment:  Overall doing well from surgery right and I am discharging her from the procedure is done after injury she sustained and inflammatory sinus tarsitis     Plan:  Sterile prep applied and today I injected the sinus tarsi 3 mg Kenalog 5 mg Xylocaine advised on reduced activity and reappoint for [place] to recheck

## 2018-07-25 ENCOUNTER — Other Ambulatory Visit: Payer: Self-pay | Admitting: Family Medicine

## 2018-07-25 MED ORDER — CELECOXIB 200 MG PO CAPS: ORAL_CAPSULE | ORAL | 3 refills | Status: DC

## 2018-07-25 NOTE — Telephone Encounter (Signed)
RF request for celebrex. Last OV 04/11/2018 Next OV 10/10/2018 Last RX 12/30/2017 # 90 x 1 rf.  Please advise.

## 2018-08-25 ENCOUNTER — Other Ambulatory Visit: Payer: Self-pay | Admitting: Family Medicine

## 2018-08-25 NOTE — Telephone Encounter (Signed)
RF request for phentermine. Last OV 04/11/2018 Next OV 10/10/2018 Last RX dated for 05/03/2018 # 90 No refills.  Please advise.

## 2018-09-21 DIAGNOSIS — M79675 Pain in left toe(s): Secondary | ICD-10-CM | POA: Diagnosis not present

## 2018-10-03 ENCOUNTER — Ambulatory Visit (INDEPENDENT_AMBULATORY_CARE_PROVIDER_SITE_OTHER): Payer: Self-pay

## 2018-10-03 ENCOUNTER — Ambulatory Visit (INDEPENDENT_AMBULATORY_CARE_PROVIDER_SITE_OTHER): Payer: BC Managed Care – PPO | Admitting: Podiatry

## 2018-10-03 ENCOUNTER — Other Ambulatory Visit: Payer: Self-pay

## 2018-10-03 ENCOUNTER — Other Ambulatory Visit: Payer: Self-pay | Admitting: Podiatry

## 2018-10-03 ENCOUNTER — Encounter: Payer: Self-pay | Admitting: Podiatry

## 2018-10-03 VITALS — Temp 97.4°F

## 2018-10-03 DIAGNOSIS — S92502A Displaced unspecified fracture of left lesser toe(s), initial encounter for closed fracture: Secondary | ICD-10-CM

## 2018-10-03 DIAGNOSIS — M2041 Other hammer toe(s) (acquired), right foot: Secondary | ICD-10-CM

## 2018-10-06 ENCOUNTER — Ambulatory Visit (INDEPENDENT_AMBULATORY_CARE_PROVIDER_SITE_OTHER): Payer: BLUE CROSS/BLUE SHIELD | Admitting: Family Medicine

## 2018-10-06 ENCOUNTER — Other Ambulatory Visit: Payer: Self-pay

## 2018-10-06 ENCOUNTER — Encounter: Payer: Self-pay | Admitting: Family Medicine

## 2018-10-06 VITALS — BP 140/80 | HR 77 | Temp 97.9°F | Ht 63.25 in | Wt 186.0 lb

## 2018-10-06 DIAGNOSIS — M2550 Pain in unspecified joint: Secondary | ICD-10-CM

## 2018-10-06 DIAGNOSIS — M25562 Pain in left knee: Secondary | ICD-10-CM

## 2018-10-06 DIAGNOSIS — G8929 Other chronic pain: Secondary | ICD-10-CM

## 2018-10-06 DIAGNOSIS — I10 Essential (primary) hypertension: Secondary | ICD-10-CM

## 2018-10-06 DIAGNOSIS — M25561 Pain in right knee: Secondary | ICD-10-CM

## 2018-10-06 DIAGNOSIS — J4530 Mild persistent asthma, uncomplicated: Secondary | ICD-10-CM

## 2018-10-06 DIAGNOSIS — R252 Cramp and spasm: Secondary | ICD-10-CM

## 2018-10-06 DIAGNOSIS — E669 Obesity, unspecified: Secondary | ICD-10-CM

## 2018-10-06 MED ORDER — ALBUTEROL SULFATE HFA 108 (90 BASE) MCG/ACT IN AERS: INHALATION_SPRAY | RESPIRATORY_TRACT | 0 refills | Status: DC

## 2018-10-06 MED ORDER — PHENTERMINE HCL 37.5 MG PO TABS: ORAL_TABLET | ORAL | 0 refills | Status: DC

## 2018-10-06 MED ORDER — CELECOXIB 200 MG PO CAPS: ORAL_CAPSULE | ORAL | 3 refills | Status: DC

## 2018-10-06 NOTE — Progress Notes (Signed)
Virtual Visit via Video Note  I connected with pt on 10/06/18 at  4:00 PM EDT by a video enabled telemedicine application and verified that I am speaking with the correct person using two identifiers.  Location patient: home Location provider:work or home office Persons participating in the virtual visit: patient, provider  I discussed the limitations of evaluation and management by telemedicine and the availability of in person appointments. The patient expressed understanding and agreed to proceed.  Telemedicine visit is a necessity given the COVID-19 restrictions in place at the current time.  HPI: 63 y/o WF being seen for 6 mo f/u HTN, asthma, and obesity. Working from home, stressful.  Obesity: takes phentermine (long term) for appetite suppression. PMP AWARE reviewed today.  Most recent rx fill for phentermine was 08/25/18, #90, rx'd by myself. No red flags/suspicious activity.  Very little exercise.  Tries to make good food choices.  HTN: bp consistently normal at home.    Asthma: well controlled.  Taking QVAR prn.  Does not have albuterol at home at all.  Having pains in bilat CMC joints of hands, bilat knees, feet: takes celebrex daily. Has arthritis in feet, sees Dr. Earle Gell. Knees swell sometimes.  No other joints swelling. No joint redness, warmth, or stiffness.   No myalgias except bilat calf muscle cramps lately.  ROS: no CP, no SOB, no wheezing, no cough, no dizziness, no HAs, no rashes, no melena/hematochezia.  No polyuria or polydipsia.  No myalgias.  Past Medical History:  Diagnosis Date  . Asthma   . Borderline hyperlipidemia 2017/2018   Her 10 yr Framingham risk = 5% in 2018.  LDL improved 02/2017 (on no meds).  Fram risk 03/2018 4.5%  . Breast mass, right 07/08/14   Two 7 mm masses, ultrasound impression "low suspicion of malignancy" bx showed benign fibrocystic breast tissue; repeat R mammo 01/12/15 NEG-- Repeat diagnostic R breast mammo recommended 6 mo   . Colon cancer screening 03/2016   Cologuard negative.  Repeat 03/2019.  Marland Kitchen Concussion with no loss of consciousness 04/01/2008  . Depression   . Hypertension   . Obesity   . Perennial allergic rhinitis   . Polyarthralgia    Dr. Ouida Sills did rheum w/u which was neg approx 2012--all this got better with wt loss  . Right foot pain    Hammertoe, bunion, and dorsal spur to be treated surgically by podiatrist as of spring 2019    Past Surgical History:  Procedure Laterality Date  . CESAREAN SECTION     x 2  . 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  Plan repeat 2 yrs.  . left foot surgery     2nd toe dislocation repair + bunion repair ([facility])  . ROTATOR CUFF REPAIR  Approx 2009   Right    Family History  Problem Relation Age of Onset  . Arthritis Mother   . Colon cancer Mother   . COPD Father   . Hyperlipidemia Father     SOCIAL HX:  Social History   Socioeconomic History  . Marital status: Married    Spouse name: Not on file  . Number of children: Not on file  . Years of education: Not on file  . Highest education level: Not on file  Occupational History  . Not on file  Social Needs  . Financial resource strain: Not on file  . Food insecurity    Worry: Not on file    Inability: Not on file  .  Transportation needs    Medical: Not on file    Non-medical: Not on file  Tobacco Use  . Smoking status: Never Smoker  . Smokeless tobacco: Never Used  Substance and Sexual Activity  . Alcohol use: No  . Drug use: No  . Sexual activity: Yes  Lifestyle  . Physical activity    Days per week: Not on file    Minutes per session: Not on file  . Stress: Not on file  Relationships  . Social Herbalist on phone: Not on file    Gets together: Not on file    Attends religious service: Not on file    Active member of club or organization: Not on file    Attends meetings of clubs or organizations: Not on file    Relationship status: Not on file  Other  Topics Concern  . Not on file  Social History Narrative   Married 40+ yrs, two daughters, 3 grandchildren.Marland Kitchen   [place] prior to [place] since [place]s.   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   No T/A/Ds.   Exercise: Zumba weekly.      Current Outpatient Medications:  .  beclomethasone (QVAR REDIHALER) 80 MCG/ACT inhaler, Inhale 2 puffs into the lungs 2 (two) times daily., Disp: 1 Inhaler, Rfl: 6 .  Calcium Carbonate-Vitamin D (CALTRATE 600+D) 600-400 MG-UNIT per chew tablet, Chew 1 tablet by mouth daily.  , Disp: , Rfl:  .  celecoxib (CELEBREX) 200 MG capsule, TAKE 1 CAPSULE BY MOUTH EVERY DAY, Disp: 90 capsule, Rfl: 3 .  cetirizine (ZYRTEC) 10 MG tablet, Take 10 mg by mouth daily., Disp: , Rfl:  .  FAMOTIDINE PO, Take 1 tablet by mouth daily as needed., Disp: , Rfl:  .  irbesartan-hydrochlorothiazide (AVALIDE) 150-12.5 MG tablet, Take 1 tablet by mouth daily., Disp: 90 tablet, Rfl: 3 .  Multiple Vitamin (MULTIVITAMIN) capsule, Take 1 capsule by mouth daily.  , Disp: , Rfl:  .  naproxen sodium (ALEVE) 220 MG tablet, Take by mouth as needed., Disp: , Rfl:  .  olopatadine (PATANOL) 0.1 % ophthalmic solution, Place 1 drop into both eyes daily as needed., Disp: , Rfl:  .  phentermine (ADIPEX-P) 37.5 MG tablet, TAKE 1 TABLET BY MOUTH EVERY DAY BEFORE BREAKFAST, Disp: 90 tablet, Rfl: 0 .  vitamin E 100 UNIT capsule, Take 100 Units by mouth daily.  , Disp: , Rfl:  .  fluticasone (FLONASE) 50 MCG/ACT nasal spray, Place 2 sprays into both nostrils daily. (Patient not taking: Reported on 10/06/2018), Disp: 16 g, Rfl: 6  EXAM:  VITALS per patient if applicable: BP 481/85 (BP Location: Left Arm, Patient Position: Sitting, Cuff Size: Normal)   Pulse 77   Temp 97.9 F (36.6 C) (Oral)   Ht 5' 3.25" (1.607 m)   Wt 186 lb (84.4 kg)   BMI 32.69 kg/m    GENERAL: alert, oriented, appears well and in no acute distress  HEENT: atraumatic, conjunttiva clear, no obvious  abnormalities on inspection of external nose and ears  NECK: normal movements of the head and neck  LUNGS: on inspection no signs of respiratory distress, breathing rate appears normal, no obvious gross SOB, gasping or wheezing  CV: no obvious cyanosis  MS: moves all visible extremities without noticeable abnormality  PSYCH/NEURO: pleasant and cooperative, no obvious depression or anxiety, speech and thought processing grossly intact  LABS: none today    Chemistry      Component Value Date/Time  NA 142 04/11/2018 0901   K 3.9 04/11/2018 0901   CL 104 04/11/2018 0901   CO2 31 04/11/2018 0901   BUN 16 04/11/2018 0901   CREATININE 0.67 04/11/2018 0901      Component Value Date/Time   CALCIUM 8.9 04/11/2018 0901   ALKPHOS 72 04/11/2018 0901   AST 15 04/11/2018 0901   ALT 17 04/11/2018 0901   BILITOT 0.6 04/11/2018 0901      ASSESSMENT AND PLAN:  Discussed the following assessment and plan:  1) HTN: The current medical regimen is effective;  continue present plan and medications. Plan recheck BMET 6 mo at CPE.  2) Obesity: wt is pretty steady.  Continue phentermine 37.5mg  qd.  I rx'd #90 today, no RF. Need to get CSC in chart at the next f/u visit in office. Encouraged her to start exercising some and continue to work on improved diet.  3) Asthma: stable.  OK to use QVAR prn periods of worsening but will also send in albuterol rx for her to have on hand as rescue med.  4) Polyarthralgia: no sign of inflammatory arthritis. Will check bilat knee plain films to assess for osteoarthritis changes. I think her bilat CMC pain in thumbs is more of a tendonitis related to frequent/chronic keyboard use. She will continue celebrex 200mg  qd  5) Calf muscle cramps: tonic water 2-3 oz bid and Mag oxide 500 mg qd recommended today. Encouraged good hydration habits, rest.  I discussed the assessment and treatment plan with the patient. The patient was provided an opportunity to ask  questions and all were answered. The patient agreed with the plan and demonstrated an understanding of the instructions.   The patient was advised to call back or seek an in-person evaluation if the symptoms worsen or if the condition fails to improve as anticipated.  F/u: 6 mo CPE  Signed:  Crissie Sickles, MD           10/06/2018

## 2018-10-08 NOTE — Progress Notes (Signed)
Subjective:   Patient ID: Laura Green, female   DOB: 63 y.o.   MRN: [id number]   HPI Patient presents stating that she traumatized her left foot a couple weeks ago and has a tentative diagnosis of fracture but wanted to get it checked and it is been red and swollen and slightly improved   ROS      Objective:  Physical Exam  Neurovascular status intact with patient found to have inflammation pain third digit left extending into the third metatarsal that is painful when palpated and is making walking difficult     Assessment:  Inflammatory changes third digit left foot with pain associated with it with possibility for fracture     Plan:  H&P condition reviewed and at this point I have recommended wider shoes ice therapy and rigid bottom type shoes.  I explained that should get better but it is probably can take another 8 to 12 weeks to improve completely  X-ray was inconclusive as to whether there may be a mild fracture but if there is it does not appear to extend through the joint it appears to be localized

## 2018-10-10 ENCOUNTER — Ambulatory Visit: Payer: BLUE CROSS/BLUE SHIELD | Admitting: Family Medicine

## 2018-10-13 ENCOUNTER — Other Ambulatory Visit: Payer: Self-pay

## 2018-10-13 ENCOUNTER — Encounter: Payer: Self-pay | Admitting: Family Medicine

## 2018-10-13 ENCOUNTER — Ambulatory Visit (INDEPENDENT_AMBULATORY_CARE_PROVIDER_SITE_OTHER): Payer: Self-pay

## 2018-10-13 DIAGNOSIS — M25561 Pain in right knee: Secondary | ICD-10-CM

## 2018-10-13 DIAGNOSIS — G8929 Other chronic pain: Secondary | ICD-10-CM

## 2018-10-13 DIAGNOSIS — M25562 Pain in left knee: Secondary | ICD-10-CM

## 2018-11-27 ENCOUNTER — Encounter: Payer: Self-pay | Admitting: Family Medicine

## 2018-11-27 DIAGNOSIS — Z1231 Encounter for screening mammogram for malignant neoplasm of breast: Secondary | ICD-10-CM | POA: Diagnosis not present

## 2018-11-27 DIAGNOSIS — M8589 Other specified disorders of bone density and structure, multiple sites: Secondary | ICD-10-CM | POA: Diagnosis not present

## 2018-11-27 LAB — HM MAMMOGRAPHY

## 2018-12-02 ENCOUNTER — Telehealth: Payer: Self-pay

## 2018-12-02 NOTE — Telephone Encounter (Signed)
Received results for pt's recent bone density. Placed on PCP desk for review.

## 2018-12-08 ENCOUNTER — Telehealth: Payer: Self-pay

## 2018-12-08 ENCOUNTER — Encounter: Payer: Self-pay | Admitting: Family Medicine

## 2018-12-08 NOTE — Telephone Encounter (Signed)
Pls call pt and see if her GYN contacted her about her bone density results. I got a copy sent to me but I did not order the test. If she did not get contacted by her GYN about the results:  There was no change in bone density compared to 2017, continue vit D and calcium and plan repeat bone density testing in 2 yrs.

## 2018-12-08 NOTE — Telephone Encounter (Signed)
Received pt's mammogram results. Given to PCP for review.

## 2018-12-08 NOTE — Telephone Encounter (Signed)
[facility]: mammogram normal. Repeat 1 yr.

## 2018-12-09 ENCOUNTER — Telehealth: Payer: Self-pay | Admitting: Podiatry

## 2018-12-09 NOTE — Telephone Encounter (Signed)
Patient advised and voiced understanding.  

## 2018-12-09 NOTE — Telephone Encounter (Signed)
LM for pt to return call to discuss results/recommendations.  
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
## 2018-12-10 NOTE — Telephone Encounter (Signed)
This is [place] calling from [facility]. I'm calling with a question regarding a medical records request that was sent last week. Please call me back at [phone]. Thank you.

## 2018-12-23 ENCOUNTER — Encounter: Payer: Self-pay | Admitting: Family Medicine

## 2018-12-23 ENCOUNTER — Telehealth: Payer: Self-pay

## 2018-12-23 NOTE — Telephone Encounter (Signed)
Must be a duplicate, b/c we've already called pt about this DEXA.

## 2018-12-23 NOTE — Telephone Encounter (Signed)
Received results from [facility] on patients bone density. Placed on providers desk for review on 12/23/2018

## 2018-12-26 ENCOUNTER — Ambulatory Visit (INDEPENDENT_AMBULATORY_CARE_PROVIDER_SITE_OTHER): Payer: BC Managed Care – PPO | Admitting: Family Medicine

## 2018-12-26 ENCOUNTER — Other Ambulatory Visit: Payer: Self-pay

## 2018-12-26 ENCOUNTER — Encounter: Payer: Self-pay | Admitting: Family Medicine

## 2018-12-26 VITALS — BP 123/81 | HR 98 | Temp 98.5°F | Resp 16 | Ht 63.25 in | Wt 186.1 lb

## 2018-12-26 DIAGNOSIS — L729 Follicular cyst of the skin and subcutaneous tissue, unspecified: Secondary | ICD-10-CM

## 2018-12-26 NOTE — Progress Notes (Signed)
OFFICE VISIT  12/26/2018   CC:  Chief Complaint  Patient presents with  . skin check    knot of the right side/back of neck. noticed it last night. so sx. hurts when she presses on knot. has not fallen   HPI:    Patient is a 63 y.o. Caucasian female who presents for "knot on back of neck". Felt it last night, hurts a little to push on it. No bite or injury. No fevers or malaise. No treatment tried at home. She is worried and wants to make sure it is not some kind of tumor.  Past Medical History:  Diagnosis Date  . Asthma   . Borderline hyperlipidemia 2017/2018   Her 10 yr Framingham risk = 5% in 2018.  LDL improved 02/2017 (on no meds).  Fram risk 03/2018 4.5%  . Breast mass, right 07/08/14   Two 7 mm masses, ultrasound impression "low suspicion of malignancy" bx showed benign fibrocystic breast tissue; repeat R mammo 01/12/15 NEG-- Repeat diagnostic R breast mammo recommended 6 mo  . Colon cancer screening 03/2016   Cologuard negative.  Repeat 03/2019.  Marland Kitchen Concussion with no loss of consciousness 04/01/2008  . Depression   . Hypertension   . Obesity   . Osteoarthritis of knees, bilateral    mild/mod on plain films 09/2018  . Perennial allergic rhinitis   . Polyarthralgia    Dr. Ouida Sills did rheum w/u which was neg approx 2012--all this got better with wt loss  . Right foot pain    Hammertoe, bunion, and dorsal spur to be treated surgically by podiatrist as of spring 2019    Past Surgical History:  Procedure Laterality Date  . CESAREAN SECTION     x 2  . DEXA  07/19/15; 11/27/18   2017: 'penia (T-Score -1.4).  11/2018 T score -1.7.  Plan rpt 2 yrs (her GYN has been doing these)  . left foot surgery     2nd toe dislocation repair + bunion repair ([facility])  . ROTATOR CUFF REPAIR  Approx 2009   Right    Outpatient Medications Prior to Visit  Medication Sig Dispense Refill  . albuterol (PROVENTIL HFA) 108 (90 Base) MCG/ACT inhaler 1-2 puffs every 4 hours as needed  for wheezing 18 g 0  . beclomethasone (QVAR REDIHALER) 80 MCG/ACT inhaler Inhale 2 puffs into the lungs 2 (two) times daily. 1 Inhaler 6  . Calcium Carbonate-Vitamin D (CALTRATE 600+D) 600-400 MG-UNIT per chew tablet Chew 1 tablet by mouth daily.      . celecoxib (CELEBREX) 200 MG capsule TAKE 1 CAPSULE BY MOUTH EVERY DAY 90 capsule 3  . cetirizine (ZYRTEC) 10 MG tablet Take 10 mg by mouth daily.    Marland Kitchen FAMOTIDINE PO Take 1 tablet by mouth daily as needed.    . irbesartan-hydrochlorothiazide (AVALIDE) 150-12.5 MG tablet Take 1 tablet by mouth daily. 90 tablet 3  . Multiple Vitamin (MULTIVITAMIN) capsule Take 1 capsule by mouth daily.      Marland Kitchen olopatadine (PATANOL) 0.1 % ophthalmic solution Place 1 drop into both eyes daily as needed.    . phentermine (ADIPEX-P) 37.5 MG tablet TAKE 1 TABLET BY MOUTH EVERY DAY BEFORE BREAKFAST 90 tablet 0  . vitamin E 100 UNIT capsule Take 100 Units by mouth daily.      . fluticasone (FLONASE) 50 MCG/ACT nasal spray Place 2 sprays into both nostrils daily. (Patient not taking: Reported on 10/06/2018) 16 g 6   No facility-administered medications prior to visit.  Allergies  Allergen Reactions  . Augmentin [Amoxicillin-Pot Clavulanate] Rash    ROS As per HPI  PE: Blood pressure 123/81, pulse 98, temperature 98.5 F (36.9 C), temperature source Temporal, resp. rate 16, height 5' 3.25" (1.607 m), weight 186 lb 2 oz (84.4 kg), SpO2 99 %. Gen: Alert, well appearing.  Patient is oriented to person, place, time, and situation. AFFECT: pleasant, lucid thought and speech. R occipt region of scalp has a 2 cm oval subQ nodular lesion that is a bit compressible, c/w cyst.  Mild TTP.  No drainage tract is present. No overlying erythema or skin changes.  Hair is normal.  LABS:    Chemistry      Component Value Date/Time   NA 142 04/11/2018 0901   K 3.9 04/11/2018 0901   CL 104 04/11/2018 0901   CO2 31 04/11/2018 0901   BUN 16 04/11/2018 0901   CREATININE 0.67  04/11/2018 0901      Component Value Date/Time   CALCIUM 8.9 04/11/2018 0901   ALKPHOS 72 04/11/2018 0901   AST 15 04/11/2018 0901   ALT 17 04/11/2018 0901   BILITOT 0.6 04/11/2018 0901      IMPRESSION AND PLAN:  Scalp cyst. Hot compress 20 min qd-bid. Reassured. Signs/symptoms to call or return for were reviewed and pt expressed understanding.  An After Visit Summary was printed and given to the patient.  FOLLOW UP: Return if symptoms worsen or fail to improve.  Signed:  Crissie Sickles, MD           12/26/2018

## 2019-03-12 DIAGNOSIS — H47323 Drusen of optic disc, bilateral: Secondary | ICD-10-CM | POA: Diagnosis not present

## 2019-03-12 DIAGNOSIS — H04123 Dry eye syndrome of bilateral lacrimal glands: Secondary | ICD-10-CM | POA: Diagnosis not present

## 2019-03-12 DIAGNOSIS — H26493 Other secondary cataract, bilateral: Secondary | ICD-10-CM | POA: Diagnosis not present

## 2019-03-12 DIAGNOSIS — H26491 Other secondary cataract, right eye: Secondary | ICD-10-CM | POA: Diagnosis not present

## 2019-03-12 DIAGNOSIS — Z961 Presence of intraocular lens: Secondary | ICD-10-CM | POA: Diagnosis not present

## 2019-03-12 DIAGNOSIS — H18513 Endothelial corneal dystrophy, bilateral: Secondary | ICD-10-CM | POA: Diagnosis not present

## 2019-03-12 HISTORY — PX: RETINAL LASER PROCEDURE: SHX2339

## 2019-03-24 ENCOUNTER — Other Ambulatory Visit: Payer: Self-pay | Admitting: Family Medicine

## 2019-03-24 DIAGNOSIS — H26492 Other secondary cataract, left eye: Secondary | ICD-10-CM | POA: Diagnosis not present

## 2019-03-24 NOTE — Telephone Encounter (Signed)
RF request for Phentermine LOV:12/26/18 Next ov: 04/15/19 Last written:10/06/18(90,0)  Please Advise. Medication pending

## 2019-03-26 HISTORY — PX: RETINAL LASER PROCEDURE: SHX2339

## 2019-03-31 DIAGNOSIS — H26492 Other secondary cataract, left eye: Secondary | ICD-10-CM | POA: Diagnosis not present

## 2019-03-31 DIAGNOSIS — H47323 Drusen of optic disc, bilateral: Secondary | ICD-10-CM | POA: Diagnosis not present

## 2019-03-31 DIAGNOSIS — H43392 Other vitreous opacities, left eye: Secondary | ICD-10-CM | POA: Diagnosis not present

## 2019-03-31 DIAGNOSIS — H18513 Endothelial corneal dystrophy, bilateral: Secondary | ICD-10-CM | POA: Diagnosis not present

## 2019-03-31 DIAGNOSIS — H538 Other visual disturbances: Secondary | ICD-10-CM | POA: Diagnosis not present

## 2019-04-13 ENCOUNTER — Other Ambulatory Visit: Payer: Self-pay

## 2019-04-15 ENCOUNTER — Other Ambulatory Visit: Payer: Self-pay

## 2019-04-15 ENCOUNTER — Encounter: Payer: Self-pay | Admitting: Family Medicine

## 2019-04-15 ENCOUNTER — Ambulatory Visit (INDEPENDENT_AMBULATORY_CARE_PROVIDER_SITE_OTHER): Payer: BC Managed Care – PPO | Admitting: Family Medicine

## 2019-04-15 VITALS — BP 129/86 | HR 82 | Temp 98.1°F | Resp 16 | Ht 63.25 in | Wt 184.4 lb

## 2019-04-15 DIAGNOSIS — M8949 Other hypertrophic osteoarthropathy, multiple sites: Secondary | ICD-10-CM | POA: Diagnosis not present

## 2019-04-15 DIAGNOSIS — Z23 Encounter for immunization: Secondary | ICD-10-CM | POA: Diagnosis not present

## 2019-04-15 DIAGNOSIS — Z0000 Encounter for general adult medical examination without abnormal findings: Secondary | ICD-10-CM | POA: Diagnosis not present

## 2019-04-15 DIAGNOSIS — I10 Essential (primary) hypertension: Secondary | ICD-10-CM

## 2019-04-15 DIAGNOSIS — M159 Polyosteoarthritis, unspecified: Secondary | ICD-10-CM

## 2019-04-15 DIAGNOSIS — Z1231 Encounter for screening mammogram for malignant neoplasm of breast: Secondary | ICD-10-CM

## 2019-04-15 DIAGNOSIS — Z1211 Encounter for screening for malignant neoplasm of colon: Secondary | ICD-10-CM

## 2019-04-15 LAB — CBC WITH DIFFERENTIAL/PLATELET
Basophils Absolute: 0 10*3/uL (ref 0.0–0.1)
Basophils Relative: 1.1 % (ref 0.0–3.0)
Eosinophils Absolute: 0.2 10*3/uL (ref 0.0–0.7)
Eosinophils Relative: 4.7 % (ref 0.0–5.0)
HCT: 38 % (ref 36.0–46.0)
Hemoglobin: 12.6 g/dL (ref 12.0–15.0)
Lymphocytes Relative: 28 % (ref 12.0–46.0)
Lymphs Abs: 1.2 10*3/uL (ref 0.7–4.0)
MCHC: 33 g/dL (ref 30.0–36.0)
MCV: 88.8 fl (ref 78.0–100.0)
Monocytes Absolute: 0.4 10*3/uL (ref 0.1–1.0)
Monocytes Relative: 9.4 % (ref 3.0–12.0)
Neutro Abs: 2.5 10*3/uL (ref 1.4–7.7)
Neutrophils Relative %: 56.8 % (ref 43.0–77.0)
Platelets: 232 10*3/uL (ref 150.0–400.0)
RBC: 4.28 Mil/uL (ref 3.87–5.11)
RDW: 12.7 % (ref 11.5–15.5)
WBC: 4.3 10*3/uL (ref 4.0–10.5)

## 2019-04-15 LAB — COMPREHENSIVE METABOLIC PANEL
ALT: 15 U/L (ref 0–35)
AST: 14 U/L (ref 0–37)
Albumin: 3.3 g/dL — ABNORMAL LOW (ref 3.5–5.2)
Alkaline Phosphatase: 62 U/L (ref 39–117)
BUN: 19 mg/dL (ref 6–23)
CO2: 32 mEq/L (ref 19–32)
Calcium: 8.8 mg/dL (ref 8.4–10.5)
Chloride: 104 mEq/L (ref 96–112)
Creatinine, Ser: 0.68 mg/dL (ref 0.40–1.20)
GFR: 87 mL/min (ref >60.00)
Glucose, Bld: 86 mg/dL (ref 70–99)
Potassium: 3.9 mEq/L (ref 3.5–5.1)
Sodium: 140 mEq/L (ref 135–145)
Total Bilirubin: 0.8 mg/dL (ref 0.2–1.2)
Total Protein: 6.4 g/dL (ref 6.0–8.3)

## 2019-04-15 LAB — TSH: TSH: 1.73 u[IU]/mL (ref 0.35–4.50)

## 2019-04-15 LAB — LIPID PANEL
Cholesterol: 217 mg/dL — ABNORMAL HIGH (ref 0–200)
HDL: 68.5 mg/dL (ref >39.00)
LDL Cholesterol: 137 mg/dL — ABNORMAL HIGH (ref 0–99)
NonHDL: 148.88
Total CHOL/HDL Ratio: 3
Triglycerides: 58 mg/dL (ref 0.0–149.0)
VLDL: 11.6 mg/dL (ref 0.0–40.0)

## 2019-04-15 MED ORDER — CELECOXIB 200 MG PO CAPS: ORAL_CAPSULE | ORAL | 3 refills | Status: DC

## 2019-04-15 MED ORDER — IRBESARTAN-HYDROCHLOROTHIAZIDE 150-12.5 MG PO TABS: 1.0000 | ORAL_TABLET | Freq: Every day | ORAL | 3 refills | Status: DC

## 2019-04-15 NOTE — Progress Notes (Signed)
Office Note 04/15/2019  CC:  Chief Complaint  Patient presents with  . Annual Exam    pt is fasting    HPI:  Laura Green is a 64 y.o. White female who is here for annual health maintenance exam.  Lots of joint pains; back, knees, feet, wrists.  It impairs her sleep some. Occ takes aleve at bedtime to help. Celebrex 200mg  daily.  Past Medical History:  Diagnosis Date  . Asthma   . Borderline hyperlipidemia 2017/2018   Her 10 yr Framingham risk = 5% in 2018.  LDL improved 02/2017 (on no meds).  Fram risk 03/2018 4.5%  . Breast mass, right 07/08/14   Two 7 mm masses, ultrasound impression "low suspicion of malignancy" bx showed benign fibrocystic breast tissue; repeat R mammo 01/12/15 NEG-- Repeat diagnostic R breast mammo recommended 6 mo  . Colon cancer screening 03/2016   Cologuard negative.  Repeat 03/2019.  Marland Kitchen Concussion with no loss of consciousness 04/01/2008  . Depression   . Hypertension   . Obesity   . Osteoarthritis of knees, bilateral    mild/mod on plain films 09/2018  . Perennial allergic rhinitis   . Polyarthralgia    Dr. Ouida Sills did rheum w/u which was neg approx 2012--all this got better with wt loss  . Right foot pain    Hammertoe, bunion, and dorsal spur to be treated surgically by podiatrist as of spring 2019    Past Surgical History:  Procedure Laterality Date  . CESAREAN SECTION     x 2  . DEXA  07/19/15; 11/27/18   2017: 'penia (T-Score -1.4).  11/2018 T score -1.7.  Plan rpt 2 yrs (her GYN has been doing these)  . left foot surgery     2nd toe dislocation repair + bunion repair ([facility])  . RETINAL LASER PROCEDURE Right 03/12/2019  . RETINAL LASER PROCEDURE Left 03/26/2019  . ROTATOR CUFF REPAIR  Approx 2009   Right    Family History  Problem Relation Age of Onset  . Arthritis Mother   . Colon cancer Mother   . COPD Father   . Hyperlipidemia Father     Social History   Socioeconomic History  . Marital status: Married   Spouse name: Not on file  . Number of children: Not on file  . Years of education: Not on file  . Highest education level: Not on file  Occupational History  . Not on file  Tobacco Use  . Smoking status: Never Smoker  . Smokeless tobacco: Never Used  Substance and Sexual Activity  . Alcohol use: No  . Drug use: No  . Sexual activity: Yes  Other Topics Concern  . Not on file  Social History Narrative   Married 40+ yrs, two daughters, 3 grandchildren.Marland Kitchen   [place] prior to [place] since [place]s.   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   No T/A/Ds.   Exercise: Zumba weekly.   Social Determinants of Health   Financial Resource Strain:   . Difficulty of Paying Living Expenses: Not on file  Food Insecurity:   . Worried About Charity fundraiser in the Last Year: Not on file  . Ran Out of Food in the Last Year: Not on file  Transportation Needs:   . Lack of Transportation (Medical): Not on file  . Lack of Transportation (Non-Medical): Not on file  Physical Activity:   . Days of Exercise per Week: Not on file  . Minutes of  Exercise per Session: Not on file  Stress:   . Feeling of Stress : Not on file  Social Connections:   . Frequency of Communication with Friends and Family: Not on file  . Frequency of Social Gatherings with Friends and Family: Not on file  . Attends Religious Services: Not on file  . Active Member of Clubs or Organizations: Not on file  . Attends Archivist Meetings: Not on file  . Marital Status: Not on file  Intimate Partner Violence:   . Fear of Current or Ex-Partner: Not on file  . Emotionally Abused: Not on file  . Physically Abused: Not on file  . Sexually Abused: Not on file    Outpatient Medications Prior to Visit  Medication Sig Dispense Refill  . albuterol (PROVENTIL HFA) 108 (90 Base) MCG/ACT inhaler 1-2 puffs every 4 hours as needed for wheezing 18 g 0  . beclomethasone (QVAR REDIHALER) 80 MCG/ACT inhaler  Inhale 2 puffs into the lungs 2 (two) times daily. 1 Inhaler 6  . Calcium Carbonate-Vitamin D (CALTRATE 600+D) 600-400 MG-UNIT per chew tablet Chew 1 tablet by mouth daily.      . celecoxib (CELEBREX) 200 MG capsule TAKE 1 CAPSULE BY MOUTH EVERY DAY 90 capsule 3  . cetirizine (ZYRTEC) 10 MG tablet Take 10 mg by mouth daily.    Marland Kitchen FAMOTIDINE PO Take 1 tablet by mouth daily as needed.    . fluticasone (FLONASE) 50 MCG/ACT nasal spray Place 2 sprays into both nostrils daily. 16 g 6  . irbesartan-hydrochlorothiazide (AVALIDE) 150-12.5 MG tablet Take 1 tablet by mouth daily. 90 tablet 3  . Multiple Vitamin (MULTIVITAMIN) capsule Take 1 capsule by mouth daily.      Marland Kitchen olopatadine (PATANOL) 0.1 % ophthalmic solution Place 1 drop into both eyes daily as needed.    . phentermine (ADIPEX-P) 37.5 MG tablet TAKE 1 TABLET BY MOUTH EVERY DAY BEFORE BREAKFAST 90 tablet 0  . vitamin E 100 UNIT capsule Take 100 Units by mouth daily.      . silver sulfADIAZINE (SILVADENE) 1 % cream APPLY A 1/16TH INCH LAYER OF CREAM TO ENTIRE BURN TWICE DAILY     No facility-administered medications prior to visit.    Allergies  Allergen Reactions  . Augmentin [Amoxicillin-Pot Clavulanate] Rash    ROS Review of Systems  Constitutional: Negative for appetite change, chills, fatigue and fever.  HENT: Negative for congestion, dental problem, ear pain and sore throat.   Eyes: Negative for discharge, redness and visual disturbance.  Respiratory: Negative for cough, chest tightness, shortness of breath and wheezing.   Cardiovascular: Negative for chest pain, palpitations and leg swelling.  Gastrointestinal: Negative for abdominal pain, blood in stool, diarrhea, nausea and vomiting.  Genitourinary: Negative for difficulty urinating, dysuria, flank pain, frequency, hematuria and urgency.  Musculoskeletal: Positive for arthralgias. Negative for back pain, joint swelling, myalgias and neck stiffness.  Skin: Negative for pallor and  rash.  Neurological: Negative for dizziness, speech difficulty, weakness and headaches.  Hematological: Negative for adenopathy. Does not bruise/bleed easily.  Psychiatric/Behavioral: Negative for confusion and sleep disturbance. The patient is not nervous/anxious.     PE; Blood pressure 129/86, pulse 82, temperature 98.1 F (36.7 C), temperature source Temporal, resp. rate 16, height 5' 3.25" (1.607 m), weight 184 lb 6.4 oz (83.6 kg), SpO2 98 %. Body mass index is 32.41 kg/m. Exam chaperoned by Deveron Furlong, CMA.  Gen: Alert, well appearing.  Patient is oriented to person, place, time, and situation. AFFECT: pleasant,  lucid thought and speech. ENT: Ears: EACs clear, normal epithelium.  TMs with good light reflex and landmarks bilaterally.  Eyes: no injection, icteris, swelling, or exudate.  EOMI, PERRLA. Nose: no drainage or turbinate edema/swelling.  No injection or focal lesion.  Mouth: lips without lesion/swelling.  Oral mucosa pink and moist.  Dentition intact and without obvious caries or gingival swelling.  Oropharynx without erythema, exudate, or swelling.  Neck: supple/nontender.  No LAD, mass, or TM.  Carotid pulses 2+ bilaterally, without bruits. CV: RRR, no m/r/g.   LUNGS: CTA bilat, nonlabored resps, good aeration in all lung fields. ABD: soft, NT, ND, BS normal.  No hepatospenomegaly or mass.  No bruits. EXT: no clubbing, cyanosis, or edema.  Musculoskeletal: no joint swelling, erythema, warmth, or tenderness.  ROM of all joints intact. Skin - no sores or suspicious lesions or rashes or color changes   Pertinent labs:  Lab Results  Component Value Date   TSH 2.28 04/11/2018   Lab Results  Component Value Date   WBC 5.4 04/11/2018   HGB 12.6 04/11/2018   HCT 38.1 04/11/2018   MCV 87.6 04/11/2018   PLT 255.0 04/11/2018   Lab Results  Component Value Date   CREATININE 0.67 04/11/2018   BUN 16 04/11/2018   NA 142 04/11/2018   K 3.9 04/11/2018   CL 104  04/11/2018   CO2 31 04/11/2018   Lab Results  Component Value Date   ALT 17 04/11/2018   AST 15 04/11/2018   ALKPHOS 72 04/11/2018   BILITOT 0.6 04/11/2018   Lab Results  Component Value Date   CHOL 223 (H) 04/11/2018   Lab Results  Component Value Date   HDL 66.50 04/11/2018   Lab Results  Component Value Date   LDLCALC 141 (H) 04/11/2018   Lab Results  Component Value Date   TRIG 76.0 04/11/2018   Lab Results  Component Value Date   CHOLHDL 3 04/11/2018    ASSESSMENT AND PLAN:   1) Osteoarthritis: primarily knees, but also mild sx's diffusely that we have done rheumatic/connective tissue lab work for-->neg x 2.  Will increase celebrex to 200mg  bid.  Encouraged regular exercise. Discussed option of ortho referral in future to see if visco-supplementation injections would be an option for her.  2) Health maintenance exam: Reviewed age and gender appropriate health maintenance issues (prudent diet, regular exercise, health risks of tobacco and excessive alcohol, use of seatbelts, fire alarms in home, use of sunscreen).  Also reviewed age and gender appropriate health screening as well as vaccine recommendations. Vaccines: Tdap due-->given today.  Otherwise all UTD. Labs: fasting HP labs ordered. Cervical ca screening: PAP smear 03/2017 normal per GYN.  Repeat 2022 per GYN.  Has GYN appt 06/2019. Breast ca screening: mammogram normal 11/27/2018->repeat 1 yr. Colon ca screening: due for repeat cologuard->ordered today. Estrogen deficiency/osteoporosis screening: per GYN MD->osteopenia->plan repeat DEXA 11/2020.  An After Visit Summary was printed and given to the patient.  FOLLOW UP:  No follow-ups on file.  Signed:  Crissie Sickles, MD           04/15/2019

## 2019-04-15 NOTE — Addendum Note (Signed)
Addended by: Caroll Rancher L on: 04/15/2019 09:07 AM   Modules accepted: Orders

## 2019-04-15 NOTE — Patient Instructions (Signed)
Health Maintenance, Female Adopting a healthy lifestyle and getting preventive care are important in promoting health and wellness. Ask your health care provider about:  The right schedule for you to have regular tests and exams.  Things you can do on your own to prevent diseases and keep yourself healthy. What should I know about diet, weight, and exercise? Eat a healthy diet   Eat a diet that includes plenty of vegetables, fruits, low-fat dairy products, and lean protein.  Do not eat a lot of foods that are high in solid fats, added sugars, or sodium. Maintain a healthy weight Body mass index (BMI) is used to identify weight problems. It estimates body fat based on height and weight. Your health care provider can help determine your BMI and help you achieve or maintain a healthy weight. Get regular exercise Get regular exercise. This is one of the most important things you can do for your health. Most adults should:  Exercise for at least 150 minutes each week. The exercise should increase your heart rate and make you sweat (moderate-intensity exercise).  Do strengthening exercises at least twice a week. This is in addition to the moderate-intensity exercise.  Spend less time sitting. Even light physical activity can be beneficial. Watch cholesterol and blood lipids Have your blood tested for lipids and cholesterol at 65 years of age, then have this test every 5 years. Have your cholesterol levels checked more often if:  Your lipid or cholesterol levels are high.  You are older than 65 years of age.  You are at high risk for heart disease. What should I know about cancer screening? Depending on your health history and family history, you may need to have cancer screening at various ages. This may include screening for:  Breast cancer.  Cervical cancer.  Colorectal cancer.  Skin cancer.  Lung cancer. What should I know about heart disease, diabetes, and high blood  pressure? Blood pressure and heart disease  High blood pressure causes heart disease and increases the risk of stroke. This is more likely to develop in people who have high blood pressure readings, are of African descent, or are overweight.  Have your blood pressure checked: ? Every 3-5 years if you are 18-39 years of age. ? Every year if you are 40 years old or older. Diabetes Have regular diabetes screenings. This checks your fasting blood sugar level. Have the screening done:  Once every three years after age 40 if you are at a normal weight and have a low risk for diabetes.  More often and at a younger age if you are overweight or have a high risk for diabetes. What should I know about preventing infection? Hepatitis B If you have a higher risk for hepatitis B, you should be screened for this virus. Talk with your health care provider to find out if you are at risk for hepatitis B infection. Hepatitis C Testing is recommended for:  Everyone born from 1945 through 1965.  Anyone with known risk factors for hepatitis C. Sexually transmitted infections (STIs)  Get screened for STIs, including gonorrhea and chlamydia, if: ? You are sexually active and are younger than 65 years of age. ? You are older than 65 years of age and your health care provider tells you that you are at risk for this type of infection. ? Your sexual activity has changed since you were last screened, and you are at increased risk for chlamydia or gonorrhea. Ask your health care provider if   you are at risk.  Ask your health care provider about whether you are at high risk for HIV. Your health care provider may recommend a prescription medicine to help prevent HIV infection. If you choose to take medicine to prevent HIV, you should first get tested for HIV. You should then be tested every 3 months for as long as you are taking the medicine. Pregnancy  If you are about to stop having your period (premenopausal) and  you may become pregnant, seek counseling before you get pregnant.  Take 400 to 800 micrograms (mcg) of folic acid every day if you become pregnant.  Ask for birth control (contraception) if you want to prevent pregnancy. Osteoporosis and menopause Osteoporosis is a disease in which the bones lose minerals and strength with aging. This can result in bone fractures. If you are 65 years old or older, or if you are at risk for osteoporosis and fractures, ask your health care provider if you should:  Be screened for bone loss.  Take a calcium or vitamin D supplement to lower your risk of fractures.  Be given hormone replacement therapy (HRT) to treat symptoms of menopause. Follow these instructions at home: Lifestyle  Do not use any products that contain nicotine or tobacco, such as cigarettes, e-cigarettes, and chewing tobacco. If you need help quitting, ask your health care provider.  Do not use street drugs.  Do not share needles.  Ask your health care provider for help if you need support or information about quitting drugs. Alcohol use  Do not drink alcohol if: ? Your health care provider tells you not to drink. ? You are pregnant, may be pregnant, or are planning to become pregnant.  If you drink alcohol: ? Limit how much you use to 0-1 drink a day. ? Limit intake if you are breastfeeding.  Be aware of how much alcohol is in your drink. In the [place] one drink equals one 12 oz bottle of beer (355 mL), one 5 oz glass of wine (148 mL), or one 1 oz glass of hard liquor (44 mL). General instructions  Schedule regular health, dental, and eye exams.  Stay current with your vaccines.  Tell your health care provider if: ? You often feel depressed. ? You have ever been abused or do not feel safe at home. Summary  Adopting a healthy lifestyle and getting preventive care are important in promoting health and wellness.  Follow your health care provider's instructions about healthy  diet, exercising, and getting tested or screened for diseases.  Follow your health care provider's instructions on monitoring your cholesterol and blood pressure. This information is not intended to replace advice given to you by your health care provider. Make sure you discuss any questions you have with your health care provider. Document Revised: 02/05/2018 Document Reviewed: 02/05/2018 Elsevier Patient Education  2020 [employer] Inc[employer]  

## 2019-06-03 DIAGNOSIS — Z1211 Encounter for screening for malignant neoplasm of colon: Secondary | ICD-10-CM | POA: Diagnosis not present

## 2019-06-08 ENCOUNTER — Other Ambulatory Visit: Payer: Self-pay

## 2019-06-08 ENCOUNTER — Encounter: Payer: Self-pay | Admitting: Family Medicine

## 2019-06-08 DIAGNOSIS — Z1211 Encounter for screening for malignant neoplasm of colon: Secondary | ICD-10-CM

## 2019-06-08 LAB — COLOGUARD
COLOGUARD: NEGATIVE
Cologuard: NEGATIVE

## 2019-06-08 NOTE — Addendum Note (Signed)
Addended by: Gerilyn Nestle on: 06/08/2019 01:20 PM   Modules accepted: Orders

## 2019-08-26 DIAGNOSIS — L237 Allergic contact dermatitis due to plants, except food: Secondary | ICD-10-CM | POA: Diagnosis not present

## 2019-08-28 ENCOUNTER — Telehealth: Payer: BC Managed Care – PPO | Admitting: Family Medicine

## 2019-09-23 ENCOUNTER — Telehealth: Payer: Self-pay

## 2019-09-23 NOTE — Telephone Encounter (Signed)
Noted. Agree.

## 2019-09-23 NOTE — Telephone Encounter (Signed)
Patient states her feet and ankles are swollen. Just got back from vacation. Please advise,  Please call [phone]. Thank you,

## 2019-09-23 NOTE — Telephone Encounter (Signed)
Pt was called and she said she went to the beach and was walking from the house to the beach and drove the 4 hrs home late yesterday. She said she is taking all medications as RXed. BP is WNL. Pt advised to elevate feet above heart as much as possible, wear compression stockings all day, monitor fluid intake and sodium, and monitor weight. Parameters given for weight increase and when to call back. Pt advised if she begins to have SOB to call 911. She was advised to call back if she gets worse or  Friday morning not any better.

## 2019-09-23 NOTE — Telephone Encounter (Signed)
Pt did state she had a 3 lbs weight increase since coming home from vacation.

## 2019-10-13 ENCOUNTER — Ambulatory Visit: Payer: BC Managed Care – PPO | Admitting: Family Medicine

## 2019-10-19 ENCOUNTER — Other Ambulatory Visit: Payer: Self-pay

## 2019-10-22 ENCOUNTER — Encounter: Payer: Self-pay | Admitting: Family Medicine

## 2019-10-22 ENCOUNTER — Other Ambulatory Visit: Payer: Self-pay

## 2019-10-22 ENCOUNTER — Telehealth (INDEPENDENT_AMBULATORY_CARE_PROVIDER_SITE_OTHER): Payer: Self-pay | Admitting: Family Medicine

## 2019-10-22 VITALS — BP 125/71 | HR 91 | Temp 99.1°F | Wt 189.0 lb

## 2019-10-22 DIAGNOSIS — I10 Essential (primary) hypertension: Secondary | ICD-10-CM

## 2019-10-22 DIAGNOSIS — M159 Polyosteoarthritis, unspecified: Secondary | ICD-10-CM

## 2019-10-22 DIAGNOSIS — E669 Obesity, unspecified: Secondary | ICD-10-CM

## 2019-10-22 DIAGNOSIS — M8949 Other hypertrophic osteoarthropathy, multiple sites: Secondary | ICD-10-CM

## 2019-10-22 MED ORDER — CELECOXIB 200 MG PO CAPS: ORAL_CAPSULE | ORAL | 3 refills | Status: DC

## 2019-10-22 MED ORDER — QVAR REDIHALER 80 MCG/ACT IN AERB: 2.0000 | INHALATION_SPRAY | Freq: Two times a day (BID) | RESPIRATORY_TRACT | 11 refills | Status: DC

## 2019-10-22 MED ORDER — PHENTERMINE HCL 37.5 MG PO TABS: ORAL_TABLET | ORAL | 1 refills | Status: DC

## 2019-10-22 NOTE — Progress Notes (Addendum)
Virtual Visit via Video Note  I connected with pt on 10/22/19 at  8:30 AM EDT by telephone due to failure of the video enabled telemedicine application, and verified that I am speaking with the correct person using two identifiers.  Location patient: home Location provider:work or home office Persons participating in the virtual visit: patient, provider  I discussed the limitations of evaluation and management by telemedicine and the availability of in person appointments. The patient expressed understanding and agreed to proceed.  Telemedicine visit is a necessity given the COVID-19 restrictions in place at the current time.  HPI: 64 y/o WF being seen today for 6 mo f/u HTN, obesity for which she's been taking phentermine long-term, and osteoarthritis multiple joints.  A/P as of last visit: "1) Osteoarthritis: primarily knees, but also mild sx's diffusely that we have done rheumatic/connective tissue lab work for-->neg x 2.  Will increase celebrex to 200mg  bid.  Encouraged regular exercise. Discussed option of ortho referral in future to see if visco-supplementation injections would be an option for her.  2) Health maintenance exam: Reviewed age and gender appropriate health maintenance issues (prudent diet, regular exercise, health risks of tobacco and excessive alcohol, use of seatbelts, fire alarms in home, use of sunscreen).  Also reviewed age and gender appropriate health screening as well as vaccine recommendations. Vaccines: Tdap due-->given today.  Otherwise all UTD. Labs: fasting HP labs ordered. Cervical ca screening: PAP smear 03/2017 normal per GYN.  Repeat 2022 per GYN.  Has GYN appt 06/2019. Breast ca screening: mammogram normal 11/27/2018->repeat 1 yr. Colon ca screening: due for repeat cologuard->ordered today. Estrogen deficiency/osteoporosis screening: per GYN MD->osteopenia->plan repeat DEXA 11/2020."  INTERIM HX: Things going pretty well. Home bps normal. Not taking  phentermine the last 3-4 mo, apparently she requested it but this must have fallen through the cracks.  Her wt is up some since last visit.  Not much formal exercise, diet is pretty fair. Still with her aches and pains all over. Increased celebrex has helped some.  Most pain is the worst in the morning. As she gets going through her day the pain gets less.  Both knees and both 1st MCP jts are primary areas.  PMP AWARE reviewed today: most recent rx for phentermine was filled 03/24/19, #90, rx by me. No red flags.   ROS: no fevers, no CP, no SOB, no cough, no dizziness, no HAs, no rashes, no melena/hematochezia.  No polyuria or polydipsia. No focal weakness, paresthesias, or tremors.  No acute vision or hearing abnormalities. No n/v/d or abd pain.  No palpitations.     Past Medical History:  Diagnosis Date  . Asthma   . Borderline hyperlipidemia 2017/2018   Her 10 yr Framingham risk = 5% in 2018.  LDL improved 02/2017 (on no meds).  Fram risk 03/2018 4.5%  . Breast mass, right 07/08/14   Two 7 mm masses, ultrasound impression "low suspicion of malignancy" bx showed benign fibrocystic breast tissue; repeat R mammo 01/12/15 NEG-- Repeat diagnostic R breast mammo recommended 6 mo  . Colon cancer screening 03/2016   2018 Cologuard negative.  Repeat 05/2019 Neg.  . Concussion with no loss of consciousness 04/01/2008  . Depression   . Hypertension   . Obesity   . Osteoarthritis of knees, bilateral    mild/mod on plain films 09/2018  . Perennial allergic rhinitis   . Polyarthralgia    Dr. Ouida Sills did rheum w/u which was neg approx 2012--all this got better with wt loss  .  Right foot pain    Hammertoe, bunion, and dorsal spur to be treated surgically by podiatrist as of spring 2019    Past Surgical History:  Procedure Laterality Date  . CESAREAN SECTION     x 2  . DEXA  07/19/15; 11/27/18   2017: 'penia (T-Score -1.4).  11/2018 T score -1.7.  Plan rpt 2 yrs (her GYN has been doing these)  .  left foot surgery     2nd toe dislocation repair + bunion repair ([facility])  . RETINAL LASER PROCEDURE Right 03/12/2019  . RETINAL LASER PROCEDURE Left 03/26/2019  . ROTATOR CUFF REPAIR  Approx 2009   Right    Family History  Problem Relation Age of Onset  . Arthritis Mother   . Colon cancer Mother   . COPD Father   . Hyperlipidemia Father     Current Outpatient Medications:  .  albuterol (PROVENTIL HFA) 108 (90 Base) MCG/ACT inhaler, 1-2 puffs every 4 hours as needed for wheezing, Disp: 18 g, Rfl: 0 .  Calcium Carbonate-Vitamin D (CALTRATE 600+D) 600-400 MG-UNIT per chew tablet, Chew 1 tablet by mouth daily.  , Disp: , Rfl:  .  celecoxib (CELEBREX) 200 MG capsule, TAKE 1 CAPSULE BY MOUTH EVERY DAY, Disp: 180 capsule, Rfl: 3 .  cetirizine (ZYRTEC) 10 MG tablet, Take 10 mg by mouth daily., Disp: , Rfl:  .  FAMOTIDINE PO, Take 1 tablet by mouth daily as needed., Disp: , Rfl:  .  irbesartan-hydrochlorothiazide (AVALIDE) 150-12.5 MG tablet, Take 1 tablet by mouth daily., Disp: 90 tablet, Rfl: 3 .  Multiple Vitamin (MULTIVITAMIN) capsule, Take 1 capsule by mouth daily.  , Disp: , Rfl:  .  olopatadine (PATANOL) 0.1 % ophthalmic solution, Place 1 drop into both eyes daily as needed., Disp: , Rfl:  .  vitamin E 100 UNIT capsule, Take 100 Units by mouth daily.  , Disp: , Rfl:  .  beclomethasone (QVAR REDIHALER) 80 MCG/ACT inhaler, Inhale 2 puffs into the lungs 2 (two) times daily. (Patient not taking: Reported on 10/22/2019), Disp: 1 Inhaler, Rfl: 6 .  fluticasone (FLONASE) 50 MCG/ACT nasal spray, Place 2 sprays into both nostrils daily. (Patient not taking: Reported on 10/22/2019), Disp: 16 g, Rfl: 6 .  phentermine (ADIPEX-P) 37.5 MG tablet, TAKE 1 TABLET BY MOUTH EVERY DAY BEFORE BREAKFAST (Patient not taking: Reported on 10/22/2019), Disp: 90 tablet, Rfl: 0  EXAM:  VITALS per patient if applicable:  Vitals with BMI 10/22/2019 04/15/2019 12/26/2018  Height - 5' 3.25" 5' 3.25"  Weight  189 lbs 184 lbs 6 oz 186 lbs 2 oz  BMI - 52.77 82.42  Systolic 353 614 431  Diastolic 71 86 81  Pulse 91 82 98     GENERAL: alert, oriented, sounds well and in no acute distress  No further exam b/c audio visit only.   LABS: none today    Chemistry      Component Value Date/Time   NA 140 04/15/2019 0854   K 3.9 04/15/2019 0854   CL 104 04/15/2019 0854   CO2 32 04/15/2019 0854   BUN 19 04/15/2019 0854   CREATININE 0.68 04/15/2019 0854      Component Value Date/Time   CALCIUM 8.8 04/15/2019 0854   ALKPHOS 62 04/15/2019 0854   AST 14 04/15/2019 0854   ALT 15 04/15/2019 0854   BILITOT 0.8 04/15/2019 0854     Lab Results  Component Value Date   CHOL 217 (H) 04/15/2019   HDL 68.50 04/15/2019  LDLCALC 137 (H) 04/15/2019   LDLDIRECT 146.6 09/15/2008   TRIG 58.0 04/15/2019   CHOLHDL 3 04/15/2019   Lab Results  Component Value Date   WBC 4.3 04/15/2019   HGB 12.6 04/15/2019   HCT 38.0 04/15/2019   MCV 88.8 04/15/2019   PLT 232.0 04/15/2019   Lab Results  Component Value Date   TSH 1.73 04/15/2019    ASSESSMENT AND PLAN:  Discussed the following assessment and plan:  1) HTN: The current medical regimen is effective;  continue present plan and medications. Lytes/cr future--pt to schedule.  2) Obesity, class I:  Diet fair, no exercise due to chronic joint pains. We'll get her back on her phentermine 37.5mg  qd. Rx for this sent today, #90, RF x 1.  3) Osteoarthritis, multiple sites, but also mild sx's diffusely that we have done rheumatic/connective tissue lab work for-->neg x 2.  Continue celebrex 200 mg bid.  Monitoring sCr--ordered--since on chronic NSAIDs. Encouraged regular exercise. Discussed option of ortho referral in future to see if visco-supplementation injections would be an option for her.   -we discussed possible serious and likely etiologies, options for evaluation and workup, limitations of telemedicine visit vs in person visit, treatment,  treatment risks and precautions. Pt prefers to treat via telemedicine empirically rather then risking or undertaking an in person visit at this moment.  Work/School slipped offered: Advised to seek prompt follow up telemedicine visit or in person care if worsening, new symptoms arise, or if is not improving with treatment. Did let her know that I only do telemedicine on Tuesdays and Thursdays for [facility] and advised follow up visit with PCP or [facility] if needs follow up or if any further questions arise to avoid any delays.   I discussed the assessment and treatment plan with the patient. The patient was provided an opportunity to ask questions and all were answered. The patient agreed with the plan and demonstrated an understanding of the instructions.   The patient was advised to call back or seek an in-person evaluation if the symptoms worsen or if the condition fails to improve as anticipated.  F/u: 6 mo  Signed:  Crissie Sickles, MD           10/22/2019

## 2019-12-11 ENCOUNTER — Other Ambulatory Visit: Payer: Self-pay

## 2019-12-11 DIAGNOSIS — Z20822 Contact with and (suspected) exposure to covid-19: Secondary | ICD-10-CM | POA: Diagnosis not present

## 2019-12-13 LAB — SARS-COV-2, NAA 2 DAY TAT

## 2019-12-13 LAB — NOVEL CORONAVIRUS, NAA: SARS-CoV-2, NAA: NOT DETECTED

## 2019-12-17 ENCOUNTER — Telehealth: Payer: Self-pay

## 2019-12-17 NOTE — Telephone Encounter (Signed)
A user error has taken place: encounter opened in error, closed for administrative reasons.

## 2019-12-22 ENCOUNTER — Ambulatory Visit: Payer: Self-pay

## 2019-12-29 ENCOUNTER — Other Ambulatory Visit: Payer: Self-pay

## 2019-12-29 ENCOUNTER — Ambulatory Visit (INDEPENDENT_AMBULATORY_CARE_PROVIDER_SITE_OTHER): Payer: BC Managed Care – PPO

## 2019-12-29 DIAGNOSIS — M8949 Other hypertrophic osteoarthropathy, multiple sites: Secondary | ICD-10-CM

## 2019-12-29 DIAGNOSIS — I10 Essential (primary) hypertension: Secondary | ICD-10-CM

## 2019-12-29 DIAGNOSIS — M159 Polyosteoarthritis, unspecified: Secondary | ICD-10-CM

## 2019-12-29 LAB — BASIC METABOLIC PANEL
BUN: 18 mg/dL (ref 6–23)
CO2: 31 mEq/L (ref 19–32)
Calcium: 9 mg/dL (ref 8.4–10.5)
Chloride: 101 mEq/L (ref 96–112)
Creatinine, Ser: 0.8 mg/dL (ref 0.40–1.20)
GFR: 77.49 mL/min (ref >60.00)
Glucose, Bld: 88 mg/dL (ref 70–99)
Potassium: 4.1 mEq/L (ref 3.5–5.1)
Sodium: 140 mEq/L (ref 135–145)

## 2020-04-04 ENCOUNTER — Encounter: Payer: Self-pay | Admitting: Internal Medicine

## 2020-04-04 ENCOUNTER — Ambulatory Visit (INDEPENDENT_AMBULATORY_CARE_PROVIDER_SITE_OTHER): Payer: BC Managed Care – PPO | Admitting: Internal Medicine

## 2020-04-04 ENCOUNTER — Other Ambulatory Visit: Payer: Self-pay

## 2020-04-04 VITALS — BP 151/99 | HR 68 | Temp 98.1°F | Resp 18 | Ht 63.25 in | Wt 186.5 lb

## 2020-04-04 DIAGNOSIS — W19XXXA Unspecified fall, initial encounter: Secondary | ICD-10-CM

## 2020-04-04 DIAGNOSIS — I10 Essential (primary) hypertension: Secondary | ICD-10-CM

## 2020-04-04 NOTE — Progress Notes (Signed)
Subjective:    Patient ID: Laura Green, female    DOB: Feb 09, 1956, 65 y.o.   MRN: [id number]  DOS:  04/04/2020 Type of visit - description: acute The patient was walking her dog this morning, she slipped on ice, landed on her buttocks, upper back and hit her head. No LOC, no neck pain or hip pain. Some discomfort in the upper back bilaterally when she turns her head.  She had a similar incident 10 years ago, a CT was done at the time, it was negative.   BP Readings from Last 3 Encounters:  04/04/20 (!) 151/99  10/22/19 125/71  04/15/19 129/86     Review of Systems Denies any problems such as nausea, vomiting. No headache, dizziness, diplopia or slurred speech  Past Medical History:  Diagnosis Date  . Asthma   . Borderline hyperlipidemia 2017/2018   Her 10 yr Framingham risk = 5% in 2018.  LDL improved 02/2017 (on no meds).  Fram risk 03/2018 4.5%  . Breast mass, right 07/08/14   Two 7 mm masses, ultrasound impression "low suspicion of malignancy" bx showed benign fibrocystic breast tissue; repeat R mammo 01/12/15 NEG-- Repeat diagnostic R breast mammo recommended 6 mo  . Colon cancer screening 03/2016   2018 Cologuard negative.  Repeat 05/2019 Neg.  . Concussion with no loss of consciousness 04/01/2008  . Depression   . Hypertension   . Obesity   . Osteoarthritis of knees, bilateral    mild/mod on plain films 09/2018  . Perennial allergic rhinitis   . Polyarthralgia    Dr. Ouida Sills did rheum w/u which was neg approx 2012--all this got better with wt loss  . Right foot pain    Hammertoe, bunion, and dorsal spur to be treated surgically by podiatrist as of spring 2019    Past Surgical History:  Procedure Laterality Date  . CESAREAN SECTION     x 2  . DEXA  07/19/15; 11/27/18   2017: 'penia (T-Score -1.4).  11/2018 T score -1.7.  Plan rpt 2 yrs (her GYN has been doing these)  . left foot surgery     2nd toe dislocation repair + bunion repair ([facility])  .  RETINAL LASER PROCEDURE Right 03/12/2019  . RETINAL LASER PROCEDURE Left 03/26/2019  . ROTATOR CUFF REPAIR  Approx 2009   Right    Social History   Socioeconomic History  . Marital status: Married    Spouse name: Not on file  . Number of children: Not on file  . Years of education: Not on file  . Highest education level: Not on file  Occupational History  . Not on file  Tobacco Use  . Smoking status: Never Smoker  . Smokeless tobacco: Never Used  Vaping Use  . Vaping Use: Never used  Substance and Sexual Activity  . Alcohol use: No  . Drug use: No  . Sexual activity: Yes  Other Topics Concern  . Not on file  Social History Narrative   Married 40+ yrs, two daughters, 3 grandchildren.Marland Kitchen   [place] prior to [place] since [place]s.   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   No T/A/Ds.   Exercise: Zumba weekly.   Social Determinants of Health   Financial Resource Strain: Not on file  Food Insecurity: Not on file  Transportation Needs: Not on file  Physical Activity: Not on file  Stress: Not on file  Social Connections: Not on file  Intimate Partner Violence: Not on file  Allergies as of 04/04/2020      Reactions   Augmentin [amoxicillin-pot Clavulanate] Rash      Medication List       Accurate as of April 04, 2020  3:19 PM. If you have any questions, ask your nurse or doctor.        albuterol 108 (90 Base) MCG/ACT inhaler Commonly known as: Proventil HFA 1-2 puffs every 4 hours as needed for wheezing   Calcium Carbonate-Vitamin D 600-400 MG-UNIT chew tablet Chew 1 tablet by mouth daily.   celecoxib 200 MG capsule Commonly known as: CELEBREX TAKE 1 CAPSULE BY MOUTH TWICE DAILY   cetirizine 10 MG tablet Commonly known as: ZYRTEC Take 10 mg by mouth daily.   FAMOTIDINE PO Take 1 tablet by mouth daily as needed.   fluticasone 50 MCG/ACT nasal spray Commonly known as: FLONASE Place 2 sprays into both nostrils daily.    irbesartan-hydrochlorothiazide 150-12.5 MG tablet Commonly known as: AVALIDE Take 1 tablet by mouth daily.   multivitamin capsule Take 1 capsule by mouth daily.   olopatadine 0.1 % ophthalmic solution Commonly known as: PATANOL Place 1 drop into both eyes daily as needed.   phentermine 37.5 MG tablet Commonly known as: ADIPEX-P 1 tab po qd   Qvar RediHaler 80 MCG/ACT inhaler Generic drug: beclomethasone Inhale 2 puffs into the lungs 2 (two) times daily.   vitamin E 45 MG (100 UNITS) capsule Take 100 Units by mouth daily.          Objective:   Physical Exam BP (!) 151/99 (BP Location: Right Arm, Patient Position: Sitting, Cuff Size: Small)   Pulse 68   Temp 98.1 F (36.7 C) (Oral)   Resp 18   Ht 5' 3.25" (1.607 m)   Wt 186 lb 8 oz (84.6 kg)   SpO2 98%   BMI 32.78 kg/m  General:   Well developed, NAD, BMI noted. HEENT:  Normocephalic . Face symmetric, atraumatic Neck: Full range of motion, no TTP at the cervical spine. Lungs:  CTA B Normal respiratory effort, no intercostal retractions, no accessory muscle use. Heart: RRR,  no murmur.  MSK: ROM bilateral hips and shoulders: Normal with no discomfort Skin: Not pale. Not jaundice Neurologic:  alert & oriented X3.  Speech normal, gait appropriate for age and unassisted. EOMI.  Motor symmetric Psych--  Cognition and judgment appear intact.  Cooperative with normal attention span and concentration.  Behavior appropriate. No anxious or depressed appearing.      Assessment     65 year old female, history of HTN, cough variant asthma, DJD, presents with:  Fall, contusion upper back, possibly head concussion as well: Events as described above, neurological exam is normal, no red flag symptoms. Anticipate she will be a little more sore tomorrow at the upper back and perhaps the neck.  If that is the case I will recommend Tylenol and a warm compress. If she develops headache, nausea, vomiting, dizziness or  severe pain: To call immediately. Fall prevention also discussed. HTN: BP is a slightly elevated today, at home is consistently normal.  No change for now.  This visit occurred during the SARS-CoV-2 public health emergency.  Safety protocols were in place, including screening questions prior to the visit, additional usage of staff PPE, and extensive cleaning of exam room while observing appropriate contact time as indicated for disinfecting solutions.

## 2020-04-04 NOTE — Progress Notes (Unsigned)
Pre visit review using our clinic review tool, if applicable. No additional management support is needed unless otherwise documented below in the visit note. 

## 2020-04-04 NOTE — Patient Instructions (Signed)
Check the  blood pressure regulalrly  BP GOAL is between 110/65 and  135/85. If it is consistently higher or lower, let me know   Head Injury, Adult There are many types of head injuries. They can be as minor as a small bump. Some head injuries can be worse. Worse injuries include:  A strong hit to the head that shakes the brain back and forth, causing damage (concussion).  A bruise (contusion) of the brain. This means there is bleeding in the brain that can cause swelling.  A cracked skull (skull fracture).  Bleeding in the brain that gathers, gets thick (makes a clot), and forms a bump (hematoma). Most problems from a head injury come in the first 24 hours. However, you may still have side effects up to 7-10 days after your injury. It is important to watch your condition for any changes. You may need to be watched in the emergency department or urgent care, or you may need to stay in the hospital. What are the causes? There are many possible causes of a head injury. A serious head injury may be caused by:  A car accident.  Bicycle or motorcycle accidents.  Sports injuries.  Falls.  Being hit by an object. What are the signs or symptoms? Symptoms of a head injury include a bruise, bump, or bleeding where the injury happened. Other physical symptoms may include:  Headache.  Feeling like you may vomit (nauseous) or vomiting.  Dizziness.  Blurred or double vision.  Being uncomfortable around bright lights or loud noises.  Shaking movements that you cannot control (seizures).  Feeling tired.  Trouble being woken up.  Fainting or loss of consciousness. Mental or emotional symptoms may include:  Feeling grumpy or cranky.  Confusion and memory problems.  Having trouble paying attention or concentrating.  Changes in eating or sleeping habits.  Feeling worried or nervous (anxious).  Feeling sad (depressed). How is this treated? Treatment for this condition  depends on how severe the injury is and the type of injury you have. The main goal is to prevent problems and to allow the brain time to heal. Mild head injury If you have a mild head injury, you may be sent home, and treatment may include:  Being watched. A responsible adult should stay with you for 24 hours after your injury and check on you often.  Physical rest.  Brain rest.  Pain medicines. Severe head injury If you have a severe head injury, treatment may include:  Being watched closely. This includes staying in the hospital.  Medicines to: ? Help with pain. ? Prevent seizures. ? Help with brain swelling.  Protecting your airway and using a machine that helps you breathe (ventilator).  Treatments to watch for and manage swelling inside the brain.  Brain surgery. This may be needed to: ? Remove a collection of blood or blood clots. ? Stop the bleeding. ? Remove a part of the skull. This allows room for the brain to swell. Follow these instructions at home: Activity  Rest.  Avoid activities that are hard or tiring.  Make sure you get enough sleep.  Let your brain rest. Do this by limiting activities that need a lot of thought or attention, such as: ? Watching TV. ? Playing memory games and puzzles. ? Job-related work or homework. ? Working on [employer], and texting.  Avoid activities that could cause another head injury until your doctor says it is okay. This includes playing sports. Having  another head injury, especially before the first one has healed, can be dangerous.  Ask your doctor when it is safe for you to go back to your normal activities, such as work or school. Ask your doctor for a step-by-step plan for slowly going back to your normal activities.  Ask your doctor when you can drive, ride a bicycle, or use heavy machinery. Do not do these activities if you are dizzy. Lifestyle  Do not drink alcohol until your doctor says it is  okay.  Do not use drugs.  If it is harder than usual to remember things, write them down.  If you are easily distracted, try to do one thing at a time.  Talk with family members or close friends when making important decisions.  Tell your friends, family, a trusted co-worker, and work Freight forwarder about your injury, symptoms, and limits (restrictions). Have them watch for any problems that are new or getting worse.   General instructions  Take over-the-counter and prescription medicines only as told by your doctor.  Have someone stay with you for 24 hours after your head injury. This person should watch you for any changes in your symptoms and be ready to get help.  Keep all follow-up visits as told by your doctor. This is important. How is this prevented?  Work on [employer]. This can help you avoid falls.  Wear a seat belt when you are in a moving vehicle.  Wear a helmet when you: ? Ride a bicycle. ? Ski. ? Do any other sport or activity that has a risk of injury.  If you drink alcohol: ? Limit how much you use to:  0-1 drink a day for nonpregnant women.  0-2 drinks a day for men. ? Be aware of how much alcohol is in your drink. In the [place] one drink equals one 12 oz bottle of beer (355 mL), one 5 oz glass of wine (148 mL), or one 1 oz glass of hard liquor (44 mL).  Make your home safer by: ? Getting rid of clutter from the floors and stairs. This includes things that can make you trip. ? Using grab bars in bathrooms and handrails by stairs. ? Placing non-slip mats on floors and in bathtubs. ? Putting more light in dim areas. Where to find more information  Centers for Disease Control and Prevention: http://www.wolf.info/ Get help right away if:  You have: ? A very bad headache that is not helped by medicine. ? Trouble walking or weakness in your arms and legs. ? Clear or bloody fluid coming from your nose or ears. ? Changes in how you see (vision). ? A  seizure. ? More confusion or more grumpy moods.  Your symptoms get worse.  You are sleepier than normal and have trouble staying awake.  You lose your balance.  The black centers of your eyes (pupils) change in size.  Your speech is slurred.  Your dizziness gets worse.  You vomit. These symptoms may be an emergency. Do not wait to see if the symptoms will go away. Get medical help right away. Call your local emergency services (911 in the [place] Do not drive yourself to the hospital. Summary  Head injuries can be as minor as a small bump. Some head injuries can be worse.  Treatment for this condition depends on how severe the injury is and the type of injury you have.  Have someone stay with you for 24 hours after your head injury.  Ask your doctor when it is safe for you to go back to your normal activities, such as work or school.  To prevent a head injury, wear a seat belt in a car, wear a helmet when you use a bicycle, limit your alcohol use, and make your home safer. This information is not intended to replace advice given to you by your health care provider. Make sure you discuss any questions you have with your health care provider. Document Revised: 12/26/2018 Document Reviewed: 12/26/2018 Elsevier Patient Education  2021 [employer].

## 2020-04-26 ENCOUNTER — Other Ambulatory Visit: Payer: Self-pay | Admitting: Family Medicine

## 2020-04-26 DIAGNOSIS — I10 Essential (primary) hypertension: Secondary | ICD-10-CM

## 2020-05-10 DIAGNOSIS — Z1231 Encounter for screening mammogram for malignant neoplasm of breast: Secondary | ICD-10-CM | POA: Diagnosis not present

## 2020-05-10 LAB — HM MAMMOGRAPHY

## 2020-05-13 ENCOUNTER — Telehealth: Payer: Self-pay

## 2020-05-13 NOTE — Telephone Encounter (Signed)
Noted  

## 2020-05-13 NOTE — Telephone Encounter (Signed)
Pls notify pt that her mammogram was NORMAL.

## 2020-05-13 NOTE — Telephone Encounter (Signed)
Received Mammogram results from [place] on 05/13/20. Placed on PCP desk for review.

## 2020-05-13 NOTE — Telephone Encounter (Signed)
LM for pt to return call to discuss.  

## 2020-05-13 NOTE — Telephone Encounter (Signed)
Patient returned call and I relayed message that results are normal. Patient expressed happy understanding.

## 2020-05-18 DIAGNOSIS — Z124 Encounter for screening for malignant neoplasm of cervix: Secondary | ICD-10-CM | POA: Diagnosis not present

## 2020-05-18 DIAGNOSIS — Z01419 Encounter for gynecological examination (general) (routine) without abnormal findings: Secondary | ICD-10-CM | POA: Diagnosis not present

## 2020-05-18 DIAGNOSIS — Z130 Encounter for screening for diseases of the blood and blood-forming organs and certain disorders involving the immune mechanism: Secondary | ICD-10-CM | POA: Diagnosis not present

## 2020-05-18 DIAGNOSIS — Z6832 Body mass index (BMI) 32.0-32.9, adult: Secondary | ICD-10-CM | POA: Diagnosis not present

## 2020-05-19 ENCOUNTER — Encounter: Payer: Self-pay | Admitting: Family Medicine

## 2020-05-19 DIAGNOSIS — Z124 Encounter for screening for malignant neoplasm of cervix: Secondary | ICD-10-CM | POA: Diagnosis not present

## 2020-05-19 LAB — HM PAP SMEAR: HM Pap smear: NEGATIVE

## 2020-05-23 ENCOUNTER — Telehealth: Payer: Self-pay

## 2020-05-23 NOTE — Telephone Encounter (Signed)
Received Mammogram results from [place] on 05/23/20. Placed on PCP desk for review.

## 2020-05-24 NOTE — Telephone Encounter (Signed)
Normal mammogram

## 2020-05-27 ENCOUNTER — Other Ambulatory Visit: Payer: Self-pay

## 2020-05-30 ENCOUNTER — Encounter: Payer: Self-pay | Admitting: Family Medicine

## 2020-05-30 ENCOUNTER — Other Ambulatory Visit: Payer: Self-pay

## 2020-05-30 ENCOUNTER — Ambulatory Visit (INDEPENDENT_AMBULATORY_CARE_PROVIDER_SITE_OTHER): Payer: BC Managed Care – PPO | Admitting: Family Medicine

## 2020-05-30 VITALS — BP 136/90 | HR 72 | Temp 98.1°F | Ht 63.0 in | Wt 186.2 lb

## 2020-05-30 DIAGNOSIS — Z23 Encounter for immunization: Secondary | ICD-10-CM | POA: Diagnosis not present

## 2020-05-30 DIAGNOSIS — M150 Primary generalized (osteo)arthritis: Secondary | ICD-10-CM

## 2020-05-30 DIAGNOSIS — E669 Obesity, unspecified: Secondary | ICD-10-CM

## 2020-05-30 DIAGNOSIS — Z0000 Encounter for general adult medical examination without abnormal findings: Secondary | ICD-10-CM | POA: Diagnosis not present

## 2020-05-30 DIAGNOSIS — I10 Essential (primary) hypertension: Secondary | ICD-10-CM

## 2020-05-30 DIAGNOSIS — M159 Polyosteoarthritis, unspecified: Secondary | ICD-10-CM

## 2020-05-30 DIAGNOSIS — M8949 Other hypertrophic osteoarthropathy, multiple sites: Secondary | ICD-10-CM | POA: Diagnosis not present

## 2020-05-30 DIAGNOSIS — E66811 Obesity, class 1: Secondary | ICD-10-CM

## 2020-05-30 LAB — CBC WITH DIFFERENTIAL/PLATELET
Basophils Absolute: 0.1 10*3/uL (ref 0.0–0.1)
Basophils Relative: 1.2 % (ref 0.0–3.0)
Eosinophils Absolute: 0.2 10*3/uL (ref 0.0–0.7)
Eosinophils Relative: 3.5 % (ref 0.0–5.0)
HCT: 39.2 % (ref 36.0–46.0)
Hemoglobin: 12.8 g/dL (ref 12.0–15.0)
Lymphocytes Relative: 25.3 % (ref 12.0–46.0)
Lymphs Abs: 1.2 10*3/uL (ref 0.7–4.0)
MCHC: 32.7 g/dL (ref 30.0–36.0)
MCV: 87.3 fl (ref 78.0–100.0)
Monocytes Absolute: 0.5 10*3/uL (ref 0.1–1.0)
Monocytes Relative: 10.2 % (ref 3.0–12.0)
Neutro Abs: 2.8 10*3/uL (ref 1.4–7.7)
Neutrophils Relative %: 59.8 % (ref 43.0–77.0)
Platelets: 233 10*3/uL (ref 150.0–400.0)
RBC: 4.49 Mil/uL (ref 3.87–5.11)
RDW: 13.3 % (ref 11.5–15.5)
WBC: 4.7 10*3/uL (ref 4.0–10.5)

## 2020-05-30 LAB — LIPID PANEL
Cholesterol: 218 mg/dL — ABNORMAL HIGH (ref 0–200)
HDL: 68.6 mg/dL (ref >39.00)
LDL Cholesterol: 134 mg/dL — ABNORMAL HIGH (ref 0–99)
NonHDL: 149.38
Total CHOL/HDL Ratio: 3
Triglycerides: 75 mg/dL (ref 0.0–149.0)
VLDL: 15 mg/dL (ref 0.0–40.0)

## 2020-05-30 LAB — COMPREHENSIVE METABOLIC PANEL
ALT: 21 U/L (ref 0–35)
AST: 16 U/L (ref 0–37)
Albumin: 3.4 g/dL — ABNORMAL LOW (ref 3.5–5.2)
Alkaline Phosphatase: 63 U/L (ref 39–117)
BUN: 22 mg/dL (ref 6–23)
CO2: 32 mEq/L (ref 19–32)
Calcium: 9.1 mg/dL (ref 8.4–10.5)
Chloride: 100 mEq/L (ref 96–112)
Creatinine, Ser: 0.69 mg/dL (ref 0.40–1.20)
GFR: 91 mL/min (ref >60.00)
Glucose, Bld: 88 mg/dL (ref 70–99)
Potassium: 3.9 mEq/L (ref 3.5–5.1)
Sodium: 139 mEq/L (ref 135–145)
Total Bilirubin: 0.8 mg/dL (ref 0.2–1.2)
Total Protein: 6.6 g/dL (ref 6.0–8.3)

## 2020-05-30 LAB — TSH: TSH: 2.09 u[IU]/mL (ref 0.35–4.50)

## 2020-05-30 MED ORDER — PHENTERMINE HCL 37.5 MG PO TABS
ORAL_TABLET | ORAL | 1 refills | Status: DC
Start: 2020-05-30 — End: 2021-10-20

## 2020-05-30 NOTE — Progress Notes (Signed)
Office Note 05/30/2020  CC:  Chief Complaint  Patient presents with  . Annual Exam   HPI:  Laura Green is a 65 y.o. White female who is here for annual health maintenance exam and 6 mo f/u HTN and obesity. A/P as of last visit: "1) HTN: The current medical regimen is effective;  continue present plan and medications. Lytes/cr future--pt to schedule.  2) Obesity, class I:  Diet fair, no exercise due to chronic joint pains. We'll get her back on her phentermine 37.5mg  qd. Rx for this sent today, #90, RF x 1.  3) Osteoarthritis, multiple sites, but also mild sx's diffusely that we have done rheumatic/connective tissue lab work for-->neg x 2.  Continue celebrex 200 mg bid.  Monitoring sCr--ordered--since on chronic NSAIDs. Encouraged regular exercise. Discussed option of ortho referral in future to see if visco-supplementation injections would be an option for her."  INTERIM HX: Doing well except grieving for loss of her father last week. Home bp's consistently <130/80. Still taking celebrex daily and it helps significantly with her widespread arthralgias/myalgias. She saw an orthopedist last a couple years ago and was told she had some osteoarthritis but nothing needing surgical intervention.  No formal exercise but not sedentary. She is constantly working on improving diet. Takes phentermine 37.5mg  qd for appetite suppression, no side effects.  PMP AWARE reviewed today: most recent rx for phentermine was filled 02/18/20, # 49, rx by me. No red flags.   Past Medical History:  Diagnosis Date  . Asthma   . Borderline hyperlipidemia 2017/2018   Her 10 yr Framingham risk = 5% in 2018.  LDL improved 02/2017 (on no meds).  Fram risk 03/2018 4.5%  . Breast mass, right 07/08/2014   bx benign  . Colon cancer screening 03/2016   2018 Cologuard negative.  Repeat 05/2019 Neg.  . Concussion with no loss of consciousness 04/01/2008  . Depression   . Hypertension   . Obesity   .  Osteoarthritis of knees, bilateral    mild/mod on plain films 09/2018  . Perennial allergic rhinitis   . Polyarthralgia    Dr. Ouida Sills did rheum w/u which was neg approx 2012--all this got better with wt loss  . Right foot pain    Hammertoe, bunion, and dorsal spur to be treated surgically by podiatrist as of spring 2019    Past Surgical History:  Procedure Laterality Date  . CESAREAN SECTION     x 2  . DEXA  07/19/15; 11/27/18   2017: 'penia (T-Score -1.4).  11/2018 T score -1.7.  Plan rpt 2 yrs (her GYN has been doing these)  . left foot surgery     2nd toe dislocation repair + bunion repair ([facility])  . RETINAL LASER PROCEDURE Right 03/12/2019  . RETINAL LASER PROCEDURE Left 03/26/2019  . ROTATOR CUFF REPAIR  Approx 2009   Right    Family History  Problem Relation Age of Onset  . Arthritis Mother   . Colon cancer Mother   . COPD Father   . Hyperlipidemia Father     Social History   Socioeconomic History  . Marital status: Married    Spouse name: Not on file  . Number of children: Not on file  . Years of education: Not on file  . Highest education level: Not on file  Occupational History  . Not on file  Tobacco Use  . Smoking status: Never Smoker  . Smokeless tobacco: Never Used  Vaping Use  .  Vaping Use: Never used  Substance and Sexual Activity  . Alcohol use: No  . Drug use: No  . Sexual activity: Yes  Other Topics Concern  . Not on file  Social History Narrative   Married 40+ yrs, two daughters, 3 grandchildren.Marland Kitchen   [place] prior to [place] since [place].   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   No T/A/Ds.   Exercise: Zumba weekly.   Social Determinants of Health   Financial Resource Strain: Not on file  Food Insecurity: Not on file  Transportation Needs: Not on file  Physical Activity: Not on file  Stress: Not on file  Social Connections: Not on file  Intimate Partner Violence: Not on file    Outpatient  Medications Prior to Visit  Medication Sig Dispense Refill  . albuterol (PROVENTIL HFA) 108 (90 Base) MCG/ACT inhaler 1-2 puffs every 4 hours as needed for wheezing 18 g 0  . beclomethasone (QVAR REDIHALER) 80 MCG/ACT inhaler Inhale 2 puffs into the lungs 2 (two) times daily. 1 each 11  . Calcium Carbonate-Vitamin D 600-400 MG-UNIT chew tablet Chew 1 tablet by mouth daily.    . celecoxib (CELEBREX) 200 MG capsule TAKE 1 CAPSULE BY MOUTH TWICE DAILY 180 capsule 3  . cetirizine (ZYRTEC) 10 MG tablet Take 10 mg by mouth daily.    Marland Kitchen FAMOTIDINE PO Take 1 tablet by mouth daily as needed.    . fluticasone (FLONASE) 50 MCG/ACT nasal spray Place 2 sprays into both nostrils daily. 16 g 6  . irbesartan-hydrochlorothiazide (AVALIDE) 150-12.5 MG tablet TAKE 1 TABLET BY MOUTH EVERY DAY 90 tablet 1  . Multiple Vitamin (MULTIVITAMIN) capsule Take 1 capsule by mouth daily.    Marland Kitchen olopatadine (PATANOL) 0.1 % ophthalmic solution Place 1 drop into both eyes daily as needed.    . vitamin E 100 UNIT capsule Take 100 Units by mouth daily.    . phentermine (ADIPEX-P) 37.5 MG tablet 1 tab po qd 90 tablet 1  . hydrOXYzine (VISTARIL) 100 MG capsule hydroxyzine pamoate 100 mg capsule  TAKE 1 CAPSULE BY MOUTH 3 TIMES DAILY AS NEEDED FOR ITCHING FOR UP TO 10 DAYS (Patient not taking: Reported on 05/30/2020)     No facility-administered medications prior to visit.    Allergies  Allergen Reactions  . Augmentin [Amoxicillin-Pot Clavulanate] Rash    ROS Review of Systems  Constitutional: Negative for appetite change, chills, fatigue and fever.  HENT: Negative for congestion, dental problem, ear pain and sore throat.   Eyes: Negative for discharge, redness and visual disturbance.  Respiratory: Negative for cough, chest tightness, shortness of breath and wheezing.   Cardiovascular: Negative for chest pain, palpitations and leg swelling.  Gastrointestinal: Negative for abdominal pain, blood in stool, diarrhea, nausea and  vomiting.  Genitourinary: Negative for difficulty urinating, dysuria, flank pain, frequency, hematuria and urgency.  Musculoskeletal: Positive for arthralgias (diffuse, chronic). Negative for back pain, joint swelling, myalgias and neck stiffness.  Skin: Negative for pallor and rash.  Neurological: Negative for dizziness, speech difficulty, weakness and headaches.  Hematological: Negative for adenopathy. Does not bruise/bleed easily.  Psychiatric/Behavioral: Negative for confusion and sleep disturbance. The patient is not nervous/anxious.    PE; Vitals with BMI 05/30/2020 04/04/2020 10/22/2019  Height 5\' 3"  5' 3.25" -  Weight 186 lbs 3 oz 186 lbs 8 oz 189 lbs  BMI 06.30 16.01 -  Systolic 093 235 573  Diastolic 90 99 71  Pulse 72 68 91   Exam chaperoned by cma.  Gen: Alert, well appearing.  Patient is oriented to person, place, time, and situation. AFFECT: pleasant, lucid thought and speech. ENT: Ears: EACs clear, normal epithelium.  TMs with good light reflex and landmarks bilaterally.  Eyes: no injection, icteris, swelling, or exudate.  EOMI, PERRLA. Nose: no drainage or turbinate edema/swelling.  No injection or focal lesion.  Mouth: lips without lesion/swelling.  Oral mucosa pink and moist.  Dentition intact and without obvious caries or gingival swelling.  Oropharynx without erythema, exudate, or swelling.  Neck: supple/nontender.  No LAD, mass, or TM.  Carotid pulses 2+ bilaterally, without bruits. CV: RRR, no m/r/g.   LUNGS: CTA bilat, nonlabored resps, good aeration in all lung fields. ABD: soft, NT, ND, BS normal.  No hepatospenomegaly or mass.  No bruits. EXT: no clubbing, cyanosis, or edema.  Musculoskeletal: no joint swelling, erythema, warmth, or tenderness.  ROM of all joints intact. Skin - no sores or suspicious lesions or rashes or color changes   Pertinent labs:  Lab Results  Component Value Date   TSH 1.73 04/15/2019   Lab Results  Component Value Date   WBC 4.3  04/15/2019   HGB 12.6 04/15/2019   HCT 38.0 04/15/2019   MCV 88.8 04/15/2019   PLT 232.0 04/15/2019   Lab Results  Component Value Date   CREATININE 0.80 12/29/2019   BUN 18 12/29/2019   NA 140 12/29/2019   K 4.1 12/29/2019   CL 101 12/29/2019   CO2 31 12/29/2019   Lab Results  Component Value Date   ALT 15 04/15/2019   AST 14 04/15/2019   ALKPHOS 62 04/15/2019   BILITOT 0.8 04/15/2019   Lab Results  Component Value Date   CHOL 217 (H) 04/15/2019   Lab Results  Component Value Date   HDL 68.50 04/15/2019   Lab Results  Component Value Date   LDLCALC 137 (H) 04/15/2019   Lab Results  Component Value Date   TRIG 58.0 04/15/2019   Lab Results  Component Value Date   CHOLHDL 3 04/15/2019    ASSESSMENT AND PLAN:   1) HTN: stable.  Cont irbes-hct 150-12.5 qd. Lytes/cr today.  2) Obesity:  Stable, cont working on TLCs, cont phentermine 37.5mg  qd.  3) Osteoarthritis, multiple sites.  Chronic widespread arthralgias that we have done rheumatic/connective tissue lab work for-->neg x 2.  Responds somewhat to celebrex 200mg  qd. Monitoring kidney function q85mo d/t longterm daily NSAID use.  4) Health maintenance exam: Reviewed age and gender appropriate health maintenance issues (prudent diet, regular exercise, health risks of tobacco and excessive alcohol, use of seatbelts, fire alarms in home, use of sunscreen).  Also reviewed age and gender appropriate health screening as well as vaccine recommendations. Vaccines: Pneumovax 23 due-->given today.  Otherwise ALL UTD. Labs: fasting HP labs ordered. Cervical ca screening: per GYN last week, pap normal per pt report today. Breast ca screening: mammogram normal 04/2020, plan repeat 04/2021. Colon ca screening: repeat cologuard 2024.  An After Visit Summary was printed and given to the patient.  FOLLOW UP:  Return in about 6 months (around 11/29/2020) for routine chronic illness f/u.  Signed:  Crissie Sickles, MD            05/30/2020

## 2020-05-30 NOTE — Addendum Note (Signed)
Addended by: Tora Kindred on: 05/30/2020 09:10 AM   Modules accepted: Orders

## 2020-05-30 NOTE — Patient Instructions (Signed)
Health Maintenance, Female Adopting a healthy lifestyle and getting preventive care are important in promoting health and wellness. Ask your health care provider about:  The right schedule for you to have regular tests and exams.  Things you can do on your own to prevent diseases and keep yourself healthy. What should I know about diet, weight, and exercise? Eat a healthy diet  Eat a diet that includes plenty of vegetables, fruits, low-fat dairy products, and lean protein.  Do not eat a lot of foods that are high in solid fats, added sugars, or sodium.   Maintain a healthy weight Body mass index (BMI) is used to identify weight problems. It estimates body fat based on height and weight. Your health care provider can help determine your BMI and help you achieve or maintain a healthy weight. Get regular exercise Get regular exercise. This is one of the most important things you can do for your health. Most adults should:  Exercise for at least 150 minutes each week. The exercise should increase your heart rate and make you sweat (moderate-intensity exercise).  Do strengthening exercises at least twice a week. This is in addition to the moderate-intensity exercise.  Spend less time sitting. Even light physical activity can be beneficial. Watch cholesterol and blood lipids Have your blood tested for lipids and cholesterol at 66 years of age, then have this test every 5 years. Have your cholesterol levels checked more often if:  Your lipid or cholesterol levels are high.  You are older than 66 years of age.  You are at high risk for heart disease. What should I know about cancer screening? Depending on your health history and family history, you may need to have cancer screening at various ages. This may include screening for:  Breast cancer.  Cervical cancer.  Colorectal cancer.  Skin cancer.  Lung cancer. What should I know about heart disease, diabetes, and high blood  pressure? Blood pressure and heart disease  High blood pressure causes heart disease and increases the risk of stroke. This is more likely to develop in people who have high blood pressure readings, are of African descent, or are overweight.  Have your blood pressure checked: ? Every 3-5 years if you are 18-39 years of age. ? Every year if you are 40 years old or older. Diabetes Have regular diabetes screenings. This checks your fasting blood sugar level. Have the screening done:  Once every three years after age 40 if you are at a normal weight and have a low risk for diabetes.  More often and at a younger age if you are overweight or have a high risk for diabetes. What should I know about preventing infection? Hepatitis B If you have a higher risk for hepatitis B, you should be screened for this virus. Talk with your health care provider to find out if you are at risk for hepatitis B infection. Hepatitis C Testing is recommended for:  Everyone born from 1945 through 1965.  Anyone with known risk factors for hepatitis C. Sexually transmitted infections (STIs)  Get screened for STIs, including gonorrhea and chlamydia, if: ? You are sexually active and are younger than 66 years of age. ? You are older than 66 years of age and your health care provider tells you that you are at risk for this type of infection. ? Your sexual activity has changed since you were last screened, and you are at increased risk for chlamydia or gonorrhea. Ask your health care provider   if you are at risk.  Ask your health care provider about whether you are at high risk for HIV. Your health care provider may recommend a prescription medicine to help prevent HIV infection. If you choose to take medicine to prevent HIV, you should first get tested for HIV. You should then be tested every 3 months for as long as you are taking the medicine. Pregnancy  If you are about to stop having your period (premenopausal) and  you may become pregnant, seek counseling before you get pregnant.  Take 400 to 800 micrograms (mcg) of folic acid every day if you become pregnant.  Ask for birth control (contraception) if you want to prevent pregnancy. Osteoporosis and menopause Osteoporosis is a disease in which the bones lose minerals and strength with aging. This can result in bone fractures. If you are 65 years old or older, or if you are at risk for osteoporosis and fractures, ask your health care provider if you should:  Be screened for bone loss.  Take a calcium or vitamin D supplement to lower your risk of fractures.  Be given hormone replacement therapy (HRT) to treat symptoms of menopause. Follow these instructions at home: Lifestyle  Do not use any products that contain nicotine or tobacco, such as cigarettes, e-cigarettes, and chewing tobacco. If you need help quitting, ask your health care provider.  Do not use street drugs.  Do not share needles.  Ask your health care provider for help if you need support or information about quitting drugs. Alcohol use  Do not drink alcohol if: ? Your health care provider tells you not to drink. ? You are pregnant, may be pregnant, or are planning to become pregnant.  If you drink alcohol: ? Limit how much you use to 0-1 drink a day. ? Limit intake if you are breastfeeding.  Be aware of how much alcohol is in your drink. In the [place] one drink equals one 12 oz bottle of beer (355 mL), one 5 oz glass of wine (148 mL), or one 1 oz glass of hard liquor (44 mL). General instructions  Schedule regular health, dental, and eye exams.  Stay current with your vaccines.  Tell your health care provider if: ? You often feel depressed. ? You have ever been abused or do not feel safe at home. Summary  Adopting a healthy lifestyle and getting preventive care are important in promoting health and wellness.  Follow your health care provider's instructions about healthy  diet, exercising, and getting tested or screened for diseases.  Follow your health care provider's instructions on monitoring your cholesterol and blood pressure. This information is not intended to replace advice given to you by your health care provider. Make sure you discuss any questions you have with your health care provider. Document Revised: 02/05/2018 Document Reviewed: 02/05/2018 Elsevier Patient Education  2021 Elsevier Inc.  

## 2020-05-31 ENCOUNTER — Telehealth: Payer: Self-pay | Admitting: Family Medicine

## 2020-05-31 NOTE — Telephone Encounter (Signed)
Will document in result note as well.

## 2020-05-31 NOTE — Telephone Encounter (Signed)
Patient returned call for lab results and I relayed Dr. Idelle Leech message as follows: "All labs normal except mild cholesterol elevation that does not meet criteria for medication treatment. No new recommendations." Patient voiced happy understanding.

## 2020-06-01 ENCOUNTER — Other Ambulatory Visit: Payer: Self-pay | Admitting: Family Medicine

## 2020-07-13 DIAGNOSIS — R0781 Pleurodynia: Secondary | ICD-10-CM | POA: Diagnosis not present

## 2020-07-13 DIAGNOSIS — M25511 Pain in right shoulder: Secondary | ICD-10-CM | POA: Diagnosis not present

## 2020-09-14 ENCOUNTER — Other Ambulatory Visit: Payer: Self-pay | Admitting: Family Medicine

## 2020-09-14 DIAGNOSIS — I10 Essential (primary) hypertension: Secondary | ICD-10-CM

## 2020-11-29 ENCOUNTER — Ambulatory Visit: Payer: BC Managed Care – PPO | Admitting: Family Medicine

## 2020-12-12 ENCOUNTER — Telehealth (INDEPENDENT_AMBULATORY_CARE_PROVIDER_SITE_OTHER): Payer: BC Managed Care – PPO | Admitting: Family Medicine

## 2020-12-12 ENCOUNTER — Other Ambulatory Visit: Payer: Self-pay

## 2020-12-12 ENCOUNTER — Encounter: Payer: Self-pay | Admitting: Family Medicine

## 2020-12-12 ENCOUNTER — Telehealth: Payer: Self-pay | Admitting: Family Medicine

## 2020-12-12 DIAGNOSIS — J029 Acute pharyngitis, unspecified: Secondary | ICD-10-CM

## 2020-12-12 DIAGNOSIS — R051 Acute cough: Secondary | ICD-10-CM | POA: Diagnosis not present

## 2020-12-12 DIAGNOSIS — B349 Viral infection, unspecified: Secondary | ICD-10-CM

## 2020-12-12 DIAGNOSIS — I10 Essential (primary) hypertension: Secondary | ICD-10-CM

## 2020-12-12 LAB — POCT INFLUENZA A/B
Influenza A, POC: NEGATIVE
Influenza B, POC: NEGATIVE

## 2020-12-12 LAB — POCT RAPID STREP A (OFFICE): Rapid Strep A Screen: NEGATIVE

## 2020-12-12 MED ORDER — NIRMATRELVIR/RITONAVIR (PAXLOVID)TABLET: 3.0000 | ORAL_TABLET | Freq: Two times a day (BID) | ORAL | 0 refills | Status: AC

## 2020-12-12 NOTE — Telephone Encounter (Signed)
OK, paxlovid eRX'd

## 2020-12-12 NOTE — Telephone Encounter (Signed)
FYI:Pt has taken an At [facility] and it has come back positive. She took it after her Mychart video visit with Dr. Anitra Lauth. She did take a covid test in office today. We are going to get back in touch with her once results are confirmed.

## 2020-12-12 NOTE — Telephone Encounter (Signed)
FYI

## 2020-12-12 NOTE — Telephone Encounter (Signed)
Pt.notified

## 2020-12-12 NOTE — Progress Notes (Signed)
Virtual Visit via Video Note  I connected with Laura Green on 12/12/20 at  8:00 AM EDT by a video enabled telemedicine application and verified that I am speaking with the correct person using two identifiers.  Location patient: home, [place] Location provider:work or home office Persons participating in the virtual visit: patient, provider  I discussed the limitations of evaluation and management by telemedicine and the availability of in person appointments. The patient expressed understanding and agreed to proceed.  Telemedicine visit is a necessity given the COVID-19 restrictions in place at the current time.  HPI: 65 y/o female being seen today for acute illness, (originally scheduled for CPE/RCI f/u today but d/t illness she was changed to VV--sick). Onset 2 d/a ST, face/forehead pressure/ache, some dry hacking cough.  Minimal nasal mucous/PND. No fever, no SOB, no CP, no n/v/d. Took tylenol and delsym.  No recent covid exposure (most recent was 11/29/20). No home covid testing done.  ROS: See pertinent positives and negatives per HPI.  Past Medical History:  Diagnosis Date   Asthma    Borderline hyperlipidemia 2017/2018   Her 10 yr Framingham risk = 5% in 2018.  LDL improved 02/2017 (on no meds).  Fram risk 03/2018 4.5%. Fram risk 05/2020 6.3%.   Breast mass, right 07/08/2014   bx benign   Colon cancer screening 03/2016   2018 Cologuard negative.  Repeat 05/2019 Neg.   Concussion with no loss of consciousness 04/01/2008   Depression    Hypertension    Obesity    Osteoarthritis of knees, bilateral    mild/mod on plain films 09/2018   Perennial allergic rhinitis    Polyarthralgia    Dr. Ouida Sills did rheum w/u which was neg approx 2012--all this got better with wt loss   Right foot pain    Hammertoe, bunion, and dorsal spur to be treated surgically by podiatrist as of spring 2019    Past Surgical History:  Procedure Laterality Date   CESAREAN SECTION     x 2   DEXA  07/19/15; 11/27/18    2017: 'penia (T-Score -1.4).  11/2018 T score -1.7.  Plan rpt 2 yrs (her GYN has been doing these)   left foot surgery     2nd toe dislocation repair + bunion repair ([facility])   RETINAL LASER PROCEDURE Right 03/12/2019   RETINAL LASER PROCEDURE Left 03/26/2019   ROTATOR CUFF REPAIR  Approx 2009   Right     Current Outpatient Medications:    albuterol (PROVENTIL HFA) 108 (90 Base) MCG/ACT inhaler, 1-2 puffs every 4 hours as needed for wheezing, Disp: 18 g, Rfl: 0   beclomethasone (QVAR REDIHALER) 80 MCG/ACT inhaler, Inhale 2 puffs into the lungs 2 (two) times daily., Disp: 1 each, Rfl: 11   Calcium Carbonate-Vitamin D 600-400 MG-UNIT chew tablet, Chew 1 tablet by mouth daily., Disp: , Rfl:    celecoxib (CELEBREX) 200 MG capsule, TAKE 1 CAPSULE BY MOUTH EVERY DAY, Disp: 90 capsule, Rfl: 7   cetirizine (ZYRTEC) 10 MG tablet, Take 10 mg by mouth daily., Disp: , Rfl:    FAMOTIDINE PO, Take 1 tablet by mouth daily as needed., Disp: , Rfl:    fluticasone (FLONASE) 50 MCG/ACT nasal spray, Place 2 sprays into both nostrils daily., Disp: 16 g, Rfl: 6   hydrOXYzine (VISTARIL) 100 MG capsule, hydroxyzine pamoate 100 mg capsule  TAKE 1 CAPSULE BY MOUTH 3 TIMES DAILY AS NEEDED FOR ITCHING FOR UP TO 10 DAYS (Patient not taking: Reported on 05/30/2020), Disp: ,  Rfl:    irbesartan-hydrochlorothiazide (AVALIDE) 150-12.5 MG tablet, TAKE 1 TABLET BY MOUTH EVERY DAY, Disp: 90 tablet, Rfl: 0   Multiple Vitamin (MULTIVITAMIN) capsule, Take 1 capsule by mouth daily., Disp: , Rfl:    olopatadine (PATANOL) 0.1 % ophthalmic solution, Place 1 drop into both eyes daily as needed., Disp: , Rfl:    phentermine (ADIPEX-P) 37.5 MG tablet, 1 tab po qd, Disp: 90 tablet, Rfl: 1   vitamin E 100 UNIT capsule, Take 100 Units by mouth daily., Disp: , Rfl:   EXAM:  VITALS per patient if applicable:  Vitals with BMI 05/30/2020 04/04/2020 10/22/2019  Height 5\' 3"  5' 3.25" -  Weight 186 lbs 3 oz 186 lbs 8 oz 189 lbs  BMI  72.09 47.09 -  Systolic 628 366 294  Diastolic 90 99 71  Pulse 72 68 91   GENERAL: alert, oriented, appears well and in no acute distress  HEENT: atraumatic, conjunttiva clear, no obvious abnormalities on inspection of external nose and ears  NECK: normal movements of the head and neck  LUNGS: on inspection no signs of respiratory distress, breathing rate appears normal, no obvious gross SOB, gasping or wheezing  CV: no obvious cyanosis  MS: moves all visible extremities without noticeable abnormality  PSYCH/NEURO: pleasant and cooperative, no obvious depression or anxiety, speech and thought processing grossly intact  LABS: rapid strep neg, rapid flu A/B neg.  ASSESSMENT AND PLAN:  Discussed the following assessment and plan:   Viral respiratory syndrome. Rapid strep and Flu A/B NEGATIVE today. Covid PCR sent out today. RFs: Age, hx of Asthma, obesity-->treat with paxlovid if covid test returns positive.  I discussed the assessment and treatment plan with the patient. The patient was provided an opportunity to ask questions and all were answered. The patient agreed with the plan and demonstrated an understanding of the instructions.   F/u: if not signif imp in 4-5d  Signed:  Crissie Sickles, MD           12/12/2020

## 2020-12-12 NOTE — Addendum Note (Signed)
Addended by: Octaviano Glow on: 12/12/2020 08:55 AM   Modules accepted: Orders

## 2021-01-02 DIAGNOSIS — M25511 Pain in right shoulder: Secondary | ICD-10-CM | POA: Diagnosis not present

## 2021-01-03 ENCOUNTER — Other Ambulatory Visit: Payer: Self-pay | Admitting: Family Medicine

## 2021-01-03 DIAGNOSIS — I10 Essential (primary) hypertension: Secondary | ICD-10-CM

## 2021-01-11 IMAGING — DX LEFT KNEE - COMPLETE 4+ VIEW
4 series · 4 of 4 positions shown · non-contrast
Comparison: None.

CLINICAL DATA: Bilateral chronic knee pain

EXAM:
LEFT KNEE - COMPLETE 4+ VIEW; RIGHT KNEE - COMPLETE 4+ VIEW

[knee ap (1 of 3)]
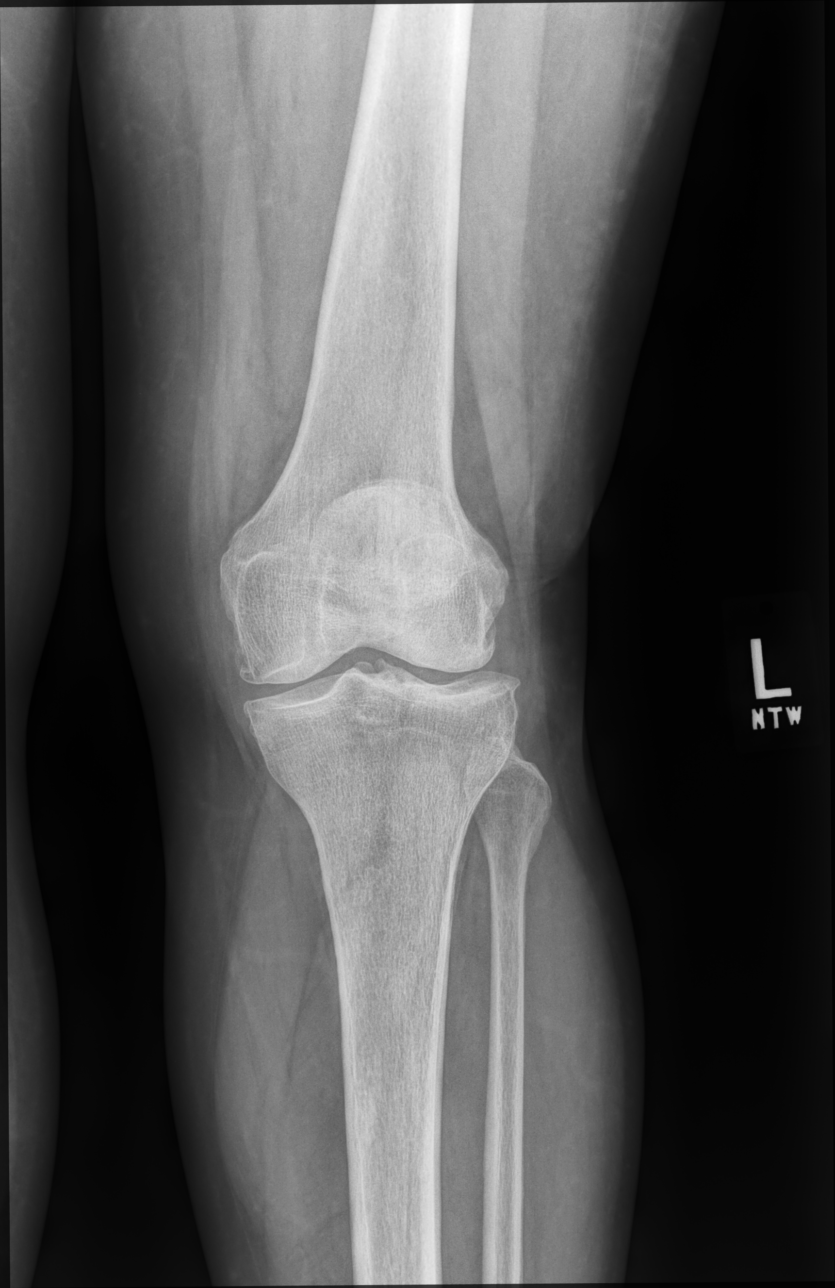

[knee ap (2 of 3)]
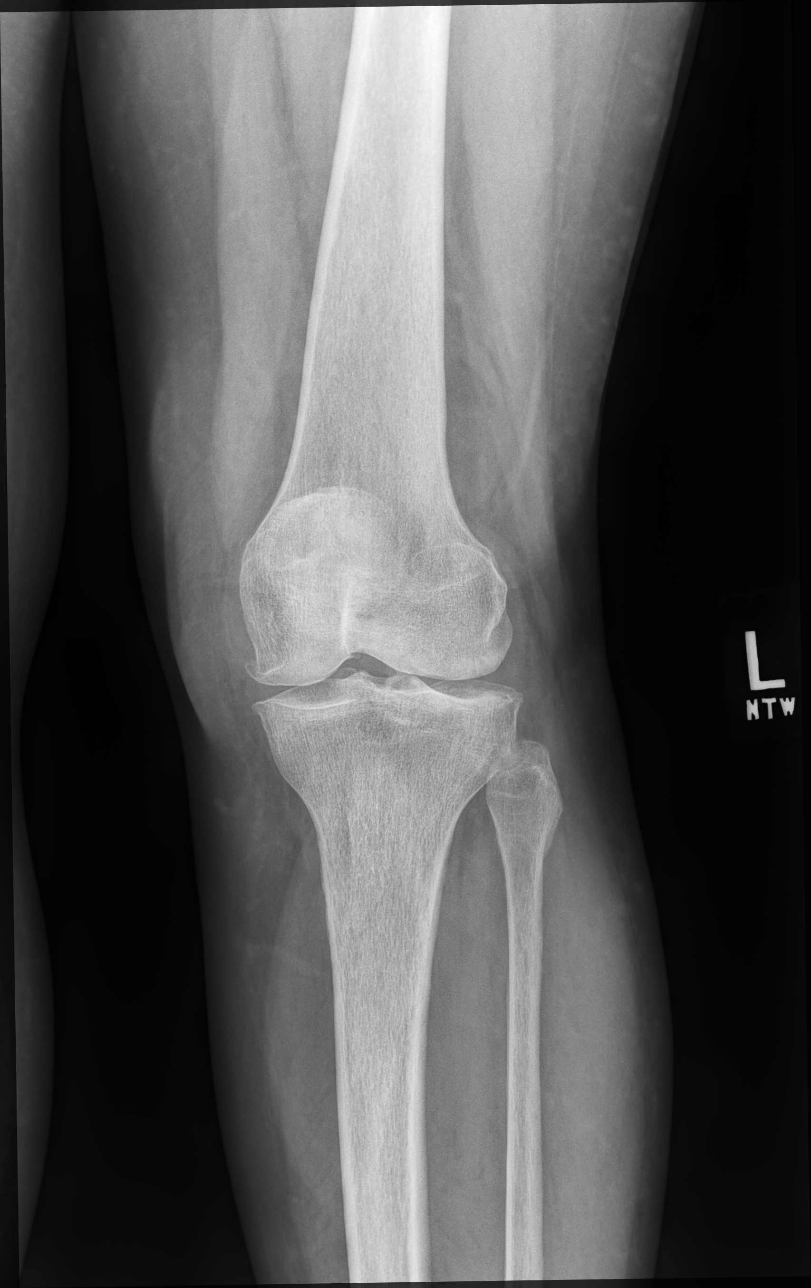

[knee ap (3 of 3)]
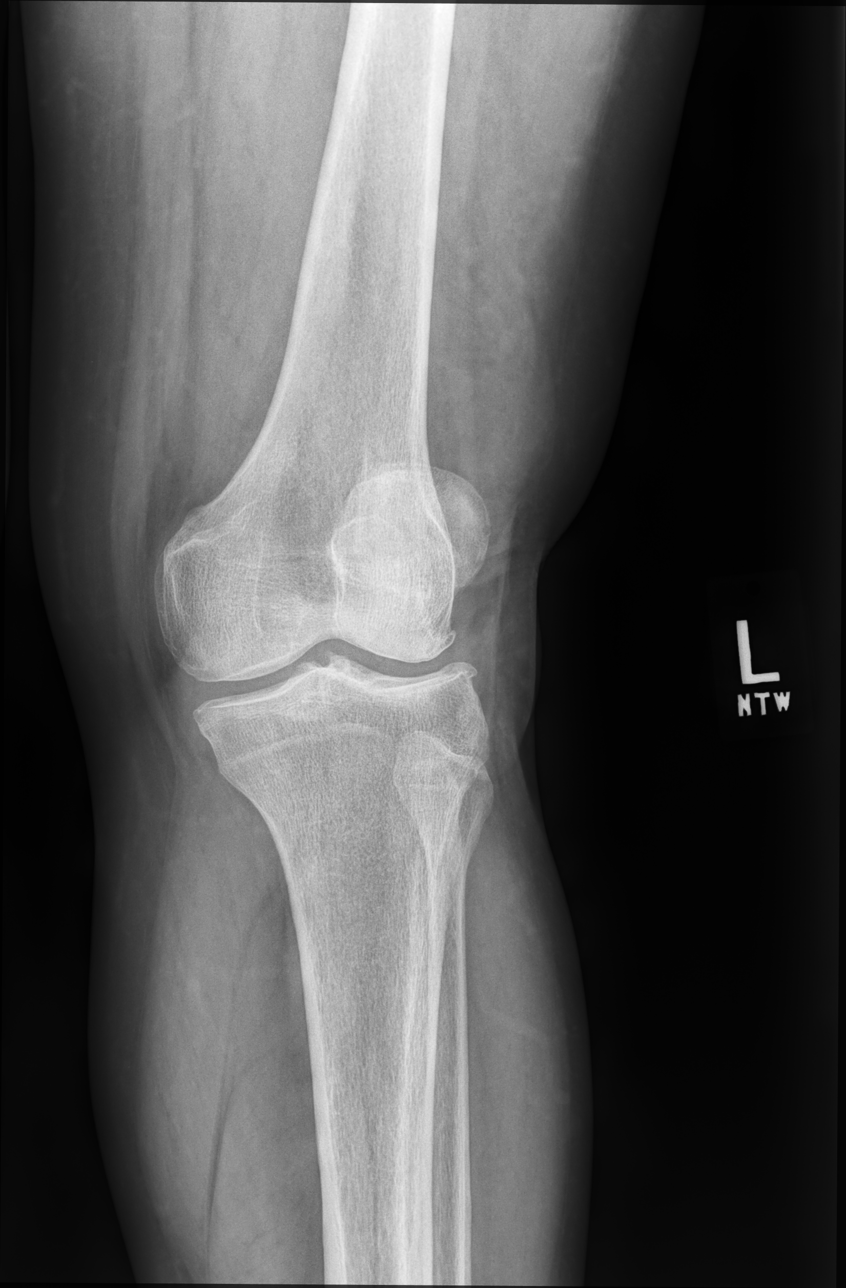

[knee lat]
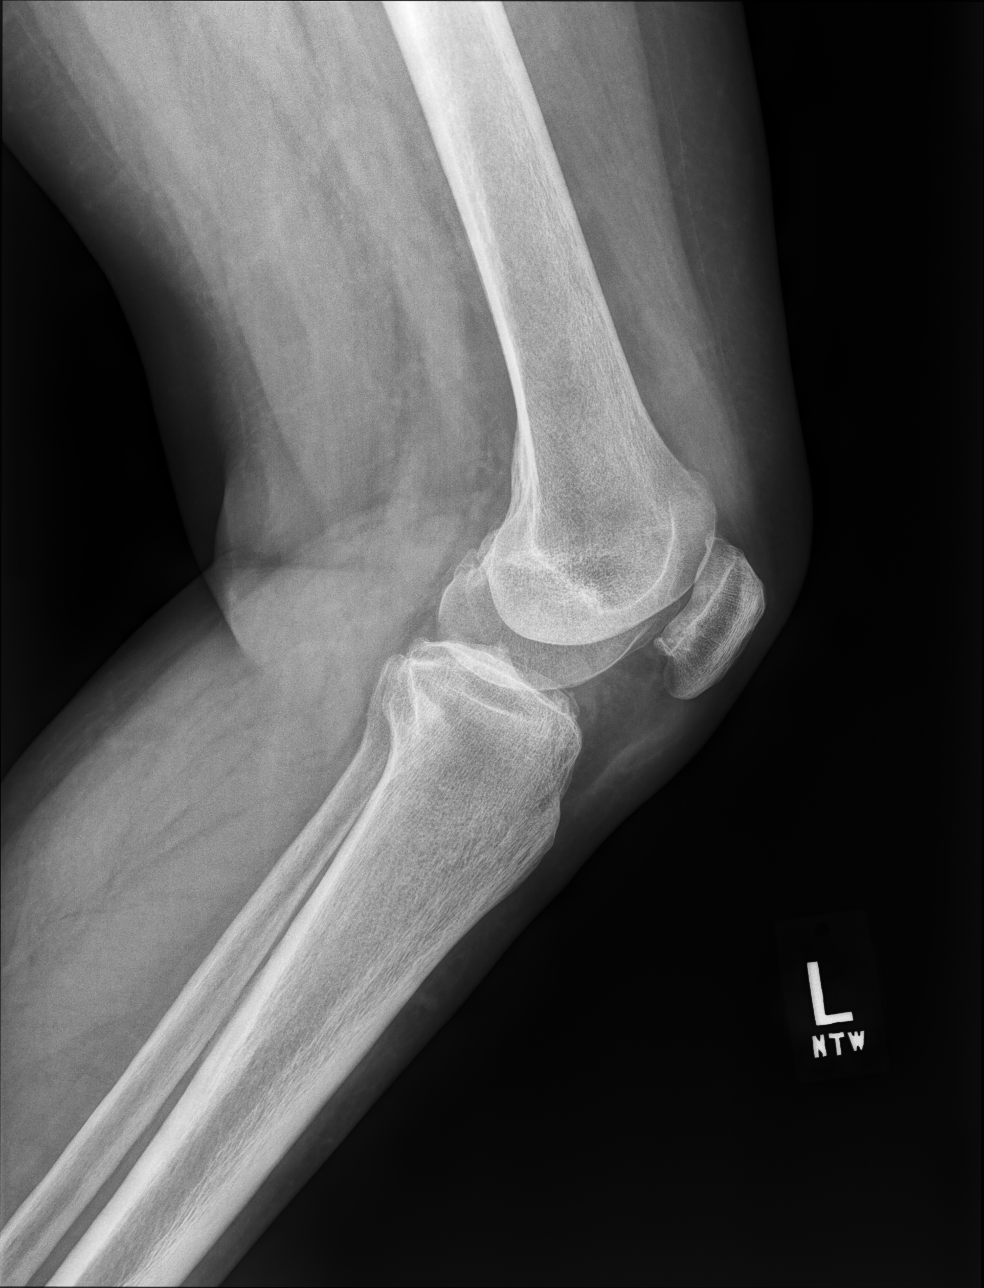

[4 of 4 positions shown; findings below may reference images not displayed]

FINDINGS: No fracture or dislocation of the bilateral knees. There is
generally mild-to-moderate, symmetric bilateral tricompartmental
joint space narrowing and osteophytosis, worst in the patellofemoral
compartments. There is a small, nonspecific left knee joint
effusion. Soft tissues are unremarkable.
IMPRESSION: No fracture or dislocation of the bilateral knees. There is
generally mild-to-moderate, symmetric bilateral tricompartmental
joint space narrowing and osteophytosis, worst in the patellofemoral
compartments. There is a small, nonspecific left knee joint
effusion. Soft tissues are unremarkable.

## 2021-01-28 ENCOUNTER — Other Ambulatory Visit: Payer: Self-pay | Admitting: Family Medicine

## 2021-01-28 DIAGNOSIS — I10 Essential (primary) hypertension: Secondary | ICD-10-CM

## 2021-01-31 ENCOUNTER — Other Ambulatory Visit: Payer: Self-pay

## 2021-01-31 ENCOUNTER — Encounter: Payer: Self-pay | Admitting: Family Medicine

## 2021-01-31 ENCOUNTER — Ambulatory Visit (INDEPENDENT_AMBULATORY_CARE_PROVIDER_SITE_OTHER): Payer: BC Managed Care – PPO | Admitting: Family Medicine

## 2021-01-31 VITALS — BP 136/80 | HR 73 | Temp 97.8°F | Ht 63.0 in | Wt 186.2 lb

## 2021-01-31 DIAGNOSIS — H8113 Benign paroxysmal vertigo, bilateral: Secondary | ICD-10-CM

## 2021-01-31 NOTE — Patient Instructions (Signed)
For prevention of muscle cramps:  Magnesium sulfate 500 mg tab (OTC) and 3-4 oz of tonic water every night.

## 2021-01-31 NOTE — Progress Notes (Signed)
OFFICE VISIT  01/31/2021  CC:  Chief Complaint  Patient presents with   Dizziness    Started yesterday; felt like she had cold sweats this morning and nausea.    HPI:    Patient is a 65 y.o. female who presents for dizziness.  HPI:  Laura Green had onset of spinning sensation yesterday, positionally induced.  Has had a couple more episodes since then.  Every episode induced by turning the head or looking down.  Gets nauseated with it, no vomiting or diarrhea..  Episodes last approximately 30 to 60 seconds.  No falls.  No recent fevers.  No hearing changes or ringing in the ears.  No headaches or palpitations.    Past Medical History:  Diagnosis Date   Asthma    Borderline hyperlipidemia 2017/2018   Her 10 yr Framingham risk = 5% in 2018.  LDL improved 02/2017 (on no meds).  Fram risk 03/2018 4.5%. Fram risk 05/2020 6.3%.   Breast mass, right 07/08/2014   bx benign   Colon cancer screening 03/2016   2018 Cologuard negative.  Repeat 05/2019 Neg.   Concussion with no loss of consciousness 04/01/2008   Depression    Hypertension    Obesity    Osteoarthritis of knees, bilateral    mild/mod on plain films 09/2018   Perennial allergic rhinitis    Polyarthralgia    Dr. Ouida Sills did rheum w/u which was neg approx 2012--all this got better with wt loss   Right foot pain    Hammertoe, bunion, and dorsal spur to be treated surgically by podiatrist as of spring 2019    Past Surgical History:  Procedure Laterality Date   CESAREAN SECTION     x 2   DEXA  07/19/15; 11/27/18   2017: 'penia (T-Score -1.4).  11/2018 T score -1.7.  Plan rpt 2 yrs (her GYN has been doing these)   left foot surgery     2nd toe dislocation repair + bunion repair ([facility])   RETINAL LASER PROCEDURE Right 03/12/2019   RETINAL LASER PROCEDURE Left 03/26/2019   ROTATOR CUFF REPAIR  Approx 2009   Right    Outpatient Medications Prior to Visit  Medication Sig Dispense Refill   Calcium Carbonate-Vitamin D  600-400 MG-UNIT chew tablet Chew 1 tablet by mouth daily.     celecoxib (CELEBREX) 200 MG capsule TAKE 1 CAPSULE BY MOUTH EVERY DAY 90 capsule 7   cetirizine (ZYRTEC) 10 MG tablet Take 10 mg by mouth daily.     irbesartan-hydrochlorothiazide (AVALIDE) 150-12.5 MG tablet Take 1 tablet by mouth daily. OFFICE VISIT NEEDED FOR FURTHER REFILLS 14 tablet 0   Multiple Vitamin (MULTIVITAMIN) capsule Take 1 capsule by mouth daily.     olopatadine (PATANOL) 0.1 % ophthalmic solution Place 1 drop into both eyes daily as needed.     vitamin E 100 UNIT capsule Take 100 Units by mouth daily.     albuterol (PROVENTIL HFA) 108 (90 Base) MCG/ACT inhaler 1-2 puffs every 4 hours as needed for wheezing (Patient not taking: Reported on 01/31/2021) 18 g 0   beclomethasone (QVAR REDIHALER) 80 MCG/ACT inhaler Inhale 2 puffs into the lungs 2 (two) times daily. (Patient not taking: Reported on 12/12/2020) 1 each 11   fluticasone (FLONASE) 50 MCG/ACT nasal spray Place 2 sprays into both nostrils daily. (Patient not taking: Reported on 01/31/2021) 16 g 6   phentermine (ADIPEX-P) 37.5 MG tablet 1 tab po qd (Patient not taking: Reported on 01/31/2021) 90 tablet 1  No facility-administered medications prior to visit.    Allergies  Allergen Reactions   Augmentin [Amoxicillin-Pot Clavulanate] Rash    ROS As per HPI  PE: Vitals with BMI 01/31/2021 05/30/2020 04/04/2020  Height 5\' 3"  5\' 3"  5' 3.25"  Weight 186 lbs 3 oz 186 lbs 3 oz 186 lbs 8 oz  BMI 32.99 37.85 88.50  Systolic 277 412 878  Diastolic 80 90 99  Pulse 73 72 68     General: Alert and well-appearing. Affect is pleasant, thought and speech are lucid. Pupils equal round and reactive to light and accommodation.  Extraocular movements intact. No nystagmus with gaze. Cardiovascular: Regular rhythm and rate without murmur rub or gallop.  Lungs are clear bilaterally, nonlabored breathing. Extremities no edema. Neuro: CN 2-12 intact bilaterally, strength 5/5 in  proximal and distal upper extremities and lower extremities bilaterally.  No sensory deficits.  No tremor.  No disdiadochokinesis.  No ataxia.  Upper extremity and lower extremity DTRs symmetric.  No pronator drift. Dix-Halpike: Vertigo and nausea induced with head turned to the left and to the right, rotary nystagmus noted.  Symptoms lasted approximately 20 to 30 seconds.  LABS:    Chemistry      Component Value Date/Time   NA 139 05/30/2020 0904   K 3.9 05/30/2020 0904   CL 100 05/30/2020 0904   CO2 32 05/30/2020 0904   BUN 22 05/30/2020 0904   CREATININE 0.69 05/30/2020 0904      Component Value Date/Time   CALCIUM 9.1 05/30/2020 0904   ALKPHOS 63 05/30/2020 0904   AST 16 05/30/2020 0904   ALT 21 05/30/2020 0904   BILITOT 0.8 05/30/2020 0904     Lab Results  Component Value Date   WBC 4.7 05/30/2020   HGB 12.8 05/30/2020   HCT 39.2 05/30/2020   MCV 87.3 05/30/2020   PLT 233.0 05/30/2020   Lab Results  Component Value Date   TSH 2.09 05/30/2020    IMPRESSION AND PLAN:  Paroxysmal positional vertigo. Discussed diagnosis with patient today.  Home Epley maneuvers demonstrated and handout given.  An After Visit Summary was printed and given to the patient.  FOLLOW UP: Return if symptoms worsen or fail to improve.  Signed:  Crissie Sickles, MD           01/31/2021

## 2021-02-22 ENCOUNTER — Other Ambulatory Visit: Payer: Self-pay | Admitting: Family Medicine

## 2021-02-23 NOTE — Telephone Encounter (Signed)
Pt called to f/u on request stating she is out and it was supposed to be sent in at last OV.

## 2021-03-02 DIAGNOSIS — H524 Presbyopia: Secondary | ICD-10-CM | POA: Diagnosis not present

## 2021-03-02 DIAGNOSIS — Z961 Presence of intraocular lens: Secondary | ICD-10-CM | POA: Diagnosis not present

## 2021-03-02 DIAGNOSIS — H47323 Drusen of optic disc, bilateral: Secondary | ICD-10-CM | POA: Diagnosis not present

## 2021-03-02 DIAGNOSIS — H52223 Regular astigmatism, bilateral: Secondary | ICD-10-CM | POA: Diagnosis not present

## 2021-03-02 DIAGNOSIS — H5213 Myopia, bilateral: Secondary | ICD-10-CM | POA: Diagnosis not present

## 2021-03-02 DIAGNOSIS — H18513 Endothelial corneal dystrophy, bilateral: Secondary | ICD-10-CM | POA: Diagnosis not present

## 2021-03-06 DIAGNOSIS — L570 Actinic keratosis: Secondary | ICD-10-CM | POA: Diagnosis not present

## 2021-04-06 ENCOUNTER — Other Ambulatory Visit: Payer: Self-pay

## 2021-04-06 ENCOUNTER — Ambulatory Visit (INDEPENDENT_AMBULATORY_CARE_PROVIDER_SITE_OTHER): Payer: BC Managed Care – PPO | Admitting: Family

## 2021-04-06 ENCOUNTER — Encounter: Payer: Self-pay | Admitting: Family

## 2021-04-06 VITALS — BP 134/83 | HR 79 | Temp 97.3°F | Ht 63.0 in | Wt 186.8 lb

## 2021-04-06 DIAGNOSIS — M5416 Radiculopathy, lumbar region: Secondary | ICD-10-CM | POA: Diagnosis not present

## 2021-04-06 MED ORDER — METHYLPREDNISOLONE ACETATE 80 MG/ML IJ SUSP
80.0000 mg | Freq: Once | INTRAMUSCULAR | Status: AC
  Administered 2021-04-06: 80 mg via INTRAMUSCULAR

## 2021-04-06 MED ORDER — CYCLOBENZAPRINE HCL 10 MG PO TABS: 10.0000 mg | ORAL_TABLET | Freq: Three times a day (TID) | ORAL | 0 refills | Status: DC | PRN

## 2021-04-06 NOTE — Progress Notes (Signed)
Subjective:     Patient ID: Laura Green, female    DOB: 10-09-1955, 66 y.o.   MRN: [id number]  Chief Complaint  Patient presents with   Back Pain    Started yesterday. Constant Pain moves down left leg. She states working from home all day, sitting at her desk. This is the first time experiencing this pain. She has used cold/hot compresses, but no relief. She has also used Bio freeze. She also mentions having a hard time with sleeping last night.    Sciatica    HPI: Pain She reports new onset lumbar pain radiating down left leg. There was not an injury that may have caused the pain. The pain started yesterday and is staying constant. The pain does radiate down the front of left leg. The pain is described as aching, burning, and tingling, is moderate in intensity, occurring constantly. Symptoms are worse in the: all day.  Aggravating factors: sitting, standing, walking, and lying on left side  She has tried NSAIDs with little relief.  Health Maintenance Due  Topic Date Due   Pneumonia Vaccine 57+ Years old (2 - PCV) 05/30/2021    Past Medical History:  Diagnosis Date   Asthma    Borderline hyperlipidemia 2017/2018   Her 10 yr Framingham risk = 5% in 2018.  LDL improved 02/2017 (on no meds).  Fram risk 03/2018 4.5%. Fram risk 05/2020 6.3%.   Breast mass, right 07/08/2014   bx benign   Colon cancer screening 03/2016   2018 Cologuard negative.  Repeat 05/2019 Neg.   Concussion with no loss of consciousness 04/01/2008   Depression    Hypertension    Obesity    Osteoarthritis of knees, bilateral    mild/mod on plain films 09/2018   Perennial allergic rhinitis    Polyarthralgia    Dr. Ouida Sills did rheum w/u which was neg approx 2012--all this got better with wt loss   Right foot pain    Hammertoe, bunion, and dorsal spur to be treated surgically by podiatrist as of spring 2019    Past Surgical History:  Procedure Laterality Date   CESAREAN SECTION     x 2   DEXA  07/19/15;  11/27/18   2017: 'penia (T-Score -1.4).  11/2018 T score -1.7.  Plan rpt 2 yrs (her GYN has been doing these)   left foot surgery     2nd toe dislocation repair + bunion repair ([facility])   RETINAL LASER PROCEDURE Right 03/12/2019   RETINAL LASER PROCEDURE Left 03/26/2019   ROTATOR CUFF REPAIR  Approx 2009   Right    Outpatient Medications Prior to Visit  Medication Sig Dispense Refill   Calcium Carbonate-Vitamin D 600-400 MG-UNIT chew tablet Chew 1 tablet by mouth daily.     celecoxib (CELEBREX) 200 MG capsule TAKE 1 CAPSULE BY MOUTH EVERY DAY 90 capsule 7   cetirizine (ZYRTEC) 10 MG tablet Take 10 mg by mouth daily.     irbesartan-hydrochlorothiazide (AVALIDE) 150-12.5 MG tablet Take 1 tablet by mouth daily. OFFICE VISIT NEEDED FOR FURTHER REFILLS 14 tablet 0   Multiple Vitamin (MULTIVITAMIN) capsule Take 1 capsule by mouth daily.     olopatadine (PATANOL) 0.1 % ophthalmic solution Place 1 drop into both eyes daily as needed.     QVAR REDIHALER 80 MCG/ACT inhaler TAKE 2 PUFFS BY MOUTH TWICE A DAY 10.6 g 11   sodium chloride (MURO 128) 5 % ophthalmic solution SMARTSIG:In Eye(s)     vitamin E 100  UNIT capsule Take 100 Units by mouth daily.     albuterol (PROVENTIL HFA) 108 (90 Base) MCG/ACT inhaler 1-2 puffs every 4 hours as needed for wheezing (Patient not taking: Reported on 01/31/2021) 18 g 0   fluticasone (FLONASE) 50 MCG/ACT nasal spray Place 2 sprays into both nostrils daily. (Patient not taking: Reported on 01/31/2021) 16 g 6   phentermine (ADIPEX-P) 37.5 MG tablet 1 tab po qd (Patient not taking: Reported on 01/31/2021) 90 tablet 1   No facility-administered medications prior to visit.    Allergies  Allergen Reactions   Augmentin [Amoxicillin-Pot Clavulanate] Rash        Objective:    Physical Exam Vitals and nursing note reviewed.  Constitutional:      Appearance: Normal appearance. She is obese.  Cardiovascular:     Rate and Rhythm: Normal rate and regular  rhythm.  Pulmonary:     Effort: Pulmonary effort is normal.     Breath sounds: Normal breath sounds.  Musculoskeletal:     Left hip: Decreased range of motion.  Skin:    General: Skin is warm and dry.  Neurological:     Mental Status: She is alert.  Psychiatric:        Mood and Affect: Mood normal.        Behavior: Behavior normal.    BP 134/83    Pulse 79    Temp (!) 97.3 F (36.3 C) (Temporal)    Ht 5\' 3"  (1.6 m)    Wt 186 lb 12.8 oz (84.7 kg)    SpO2 99%    BMI 33.09 kg/m  Wt Readings from Last 3 Encounters:  04/06/21 186 lb 12.8 oz (84.7 kg)  01/31/21 186 lb 3.2 oz (84.5 kg)  05/30/20 186 lb 3.2 oz (84.5 kg)       Assessment & Plan:   Problem List Items Addressed This Visit       Nervous and Auditory   Lumbar radiculopathy - Primary    Denies ever having before. 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 Advised pt to take frequent breaks walking around and stretching, also losing weight will help prevent future episodes. Handouts provided. Steroid injection given today and Flexeril sent, advised on use & SE.       Relevant Medications   cyclobenzaprine (FLEXERIL) 10 MG tablet    Meds ordered this encounter  Medications   cyclobenzaprine (FLEXERIL) 10 MG tablet    Sig: Take 1 tablet (10 mg total) by mouth 3 (three) times daily as needed for muscle spasms. May cause drowsiness, can just take 1-2 pills in evening or bedtime.    Dispense:  30 tablet    Refill:  0    Order Specific Question:   Supervising Provider    Answer:   ANDY, CAMILLE L [[id number]]   methylPREDNISolone acetate (DEPO-MEDROL) injection 80 mg

## 2021-04-06 NOTE — Patient Instructions (Signed)
It was very nice to see you today!  I have sent a muscle relaxer, Flexeril, to your pharmacy. This can cause drowsiness during the day, so may just want to take in evenings or at bedtime. Refer to the handouts attached for exercises & stretches to perform when you start feeling better to prevent future episodes. Also trying to lose a few pounds can also help. Be sure to take breaks often from sitting. Stand up, walk around and stretch for a few minutes to prevent future pain.    PLEASE NOTE:  If you had any lab tests please let us know if you have not heard back within a few days. You may see your results on MyChart before we have a chance to review them but we will give you a call once they are reviewed by [place]. If we ordered any referrals today, please let us know if you have not heard from their office within the next week.   Please try these tips to maintain a healthy lifestyle:  Eat most of your calories during the day when you are active. Eliminate processed foods including packaged sweets (pies, cakes, cookies), reduce intake of potatoes, white bread, white pasta, and white rice. Look for whole grain options, oat flour or almond flour.  Each meal should contain half fruits/vegetables, one quarter protein, and one quarter carbs (no bigger than a computer mouse).  Cut down on sweet beverages. This includes juice, soda, and sweet tea. Also watch fruit intake, though this is a healthier sweet option, it still contains natural sugar! Limit to 3 servings daily.  Drink at least 1 glass of water with each meal and aim for at least 8 glasses per day  Exercise at least 150 minutes every week.

## 2021-04-06 NOTE — Assessment & Plan Note (Signed)
Denies ever having before. 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 Advised pt to take frequent breaks walking around and stretching, also losing weight will help prevent future episodes. Handouts provided. Steroid injection given today and Flexeril sent, advised on use & SE.

## 2021-04-17 ENCOUNTER — Other Ambulatory Visit: Payer: Self-pay | Admitting: Family

## 2021-04-17 DIAGNOSIS — M5416 Radiculopathy, lumbar region: Secondary | ICD-10-CM

## 2021-05-27 DIAGNOSIS — Z1231 Encounter for screening mammogram for malignant neoplasm of breast: Secondary | ICD-10-CM | POA: Diagnosis not present

## 2021-05-27 LAB — HM MAMMOGRAPHY

## 2021-06-16 ENCOUNTER — Other Ambulatory Visit: Payer: Self-pay | Admitting: Family Medicine

## 2021-09-28 ENCOUNTER — Other Ambulatory Visit: Payer: Self-pay | Admitting: Family Medicine

## 2021-10-20 ENCOUNTER — Encounter: Payer: Self-pay | Admitting: Family Medicine

## 2021-10-20 ENCOUNTER — Ambulatory Visit (INDEPENDENT_AMBULATORY_CARE_PROVIDER_SITE_OTHER): Payer: BC Managed Care – PPO | Admitting: Family Medicine

## 2021-10-20 ENCOUNTER — Telehealth: Payer: Self-pay

## 2021-10-20 VITALS — BP 131/77 | HR 69 | Temp 97.8°F | Ht 63.0 in | Wt 185.2 lb

## 2021-10-20 DIAGNOSIS — M545 Low back pain, unspecified: Secondary | ICD-10-CM | POA: Diagnosis not present

## 2021-10-20 DIAGNOSIS — G8929 Other chronic pain: Secondary | ICD-10-CM | POA: Diagnosis not present

## 2021-10-20 DIAGNOSIS — I10 Essential (primary) hypertension: Secondary | ICD-10-CM | POA: Diagnosis not present

## 2021-10-20 DIAGNOSIS — M25562 Pain in left knee: Secondary | ICD-10-CM

## 2021-10-20 MED ORDER — TRIAMCINOLONE ACETONIDE 40 MG/ML IJ SUSP
40.0000 mg | Freq: Once | INTRAMUSCULAR | Status: AC
  Administered 2021-10-20: 40 mg via INTRA_ARTICULAR

## 2021-10-20 MED ORDER — PHENTERMINE HCL 37.5 MG PO TABS: ORAL_TABLET | ORAL | 1 refills | Status: DC

## 2021-10-20 MED ORDER — CELECOXIB 200 MG PO CAPS: ORAL_CAPSULE | ORAL | 0 refills | Status: DC

## 2021-10-20 MED ORDER — IRBESARTAN-HYDROCHLOROTHIAZIDE 150-12.5 MG PO TABS: 1.0000 | ORAL_TABLET | Freq: Every day | ORAL | 3 refills | Status: DC

## 2021-10-20 NOTE — Telephone Encounter (Signed)
McGowen, Phillip 4:12 PM Will you call Esme Durkin and tell her I forgot to prescribe her a hinged knee brace to wear until I see her back in 2 wks.  I'll write rx for her to pick up at her convenience.  Patient was advised. Patient or husband will come by to pick up the prescription one day next week.

## 2021-10-20 NOTE — Progress Notes (Signed)
OFFICE VISIT  10/20/2021  CC:  Chief Complaint  Patient presents with   Back Pain   Leg Pain    Left knee radiating up to back, less than 3 months. Feels hot to touch, hears a click when she goes to stand up.    Patient is a 66 y.o. female who presents for back and leg pain.  HPI:   About 6 months ago Laura Green accidentally fell in her yard into a bush and hit her left knee on the ground.  Since then she has noticed intermittent swelling of the left knee, intermittent warmth, and just recently noted more of a problem with focal swelling in the back of the left knee.  Sometimes the pain involves a lot of the leg, not just the knee.  As result of her knee issue lately she has had more diffuse low back pain and just generally hurts more all over her body.  Wakes up in the morning very stiff.  She is feeling like the Celebrex has not been helping at all lately.  (Was seen 04/06/2021 by another [place] provider right after this started, was diagnosed with lumbar radiculopathy and given a dose of methylprednisolone 80 mg in the office and prescribed Flexeril and instructed to do some stretches).  PMP AWARE reviewed today: most recent rx for phentermine was filled 10/05/20, # 90, rx by me. No red flags.   Past Medical History:  Diagnosis Date   Asthma    Borderline hyperlipidemia 2017/2018   Her 10 yr Framingham risk = 5% in 2018.  LDL improved 02/2017 (on no meds).  Fram risk 03/2018 4.5%. Fram risk 05/2020 6.3%.   Breast mass, right 07/08/2014   bx benign   Colon cancer screening 03/2016   2018 Cologuard negative.  Repeat 05/2019 Neg.   Concussion with no loss of consciousness 04/01/2008   Depression    Hypertension    Obesity    Osteoarthritis of knees, bilateral    mild/mod on plain films 09/2018   Perennial allergic rhinitis    Polyarthralgia    Dr. Ouida Sills did rheum w/u which was neg approx 2012--all this got better with wt loss   Right foot pain    Hammertoe, bunion, and dorsal spur to  be treated surgically by podiatrist as of spring 2019    Past Surgical History:  Procedure Laterality Date   CESAREAN SECTION     x 2   DEXA  07/19/15; 11/27/18   2017: 'penia (T-Score -1.4).  11/2018 T score -1.7.  Plan rpt 2 yrs (her GYN has been doing these)   left foot surgery     2nd toe dislocation repair + bunion repair ([facility])   RETINAL LASER PROCEDURE Right 03/12/2019   RETINAL LASER PROCEDURE Left 03/26/2019   ROTATOR CUFF REPAIR  Approx 2009   Right    Outpatient Medications Prior to Visit  Medication Sig Dispense Refill   Calcium Carbonate-Vitamin D 600-400 MG-UNIT chew tablet Chew 1 tablet by mouth daily.     cetirizine (ZYRTEC) 10 MG tablet Take 10 mg by mouth daily.     cyclobenzaprine (FLEXERIL) 10 MG tablet Take 1 tablet (10 mg total) by mouth 3 (three) times daily as needed for muscle spasms. May cause drowsiness, can just take 1-2 pills in evening or bedtime. 30 tablet 0   Multiple Vitamin (MULTIVITAMIN) capsule Take 1 capsule by mouth daily.     olopatadine (PATANOL) 0.1 % ophthalmic solution Place 1 drop into both eyes daily  as needed.     QVAR REDIHALER 80 MCG/ACT inhaler TAKE 2 PUFFS BY MOUTH TWICE A DAY 10.6 g 11   sodium chloride (MURO 128) 5 % ophthalmic solution SMARTSIG:In Eye(s)     vitamin E 100 UNIT capsule Take 100 Units by mouth daily.     celecoxib (CELEBREX) 200 MG capsule TAKE 1 CAPSULE BY MOUTH EVERY DAY 90 capsule 7   irbesartan-hydrochlorothiazide (AVALIDE) 150-12.5 MG tablet Take 1 tablet by mouth daily. OFFICE VISIT NEEDED FOR FURTHER REFILLS 14 tablet 0   phentermine (ADIPEX-P) 37.5 MG tablet 1 tab po qd 90 tablet 1   albuterol (PROVENTIL HFA) 108 (90 Base) MCG/ACT inhaler 1-2 puffs every 4 hours as needed for wheezing (Patient not taking: Reported on 01/31/2021) 18 g 0   fluticasone (FLONASE) 50 MCG/ACT nasal spray Place 2 sprays into both nostrils daily. (Patient not taking: Reported on 01/31/2021) 16 g 6   No facility-administered  medications prior to visit.    Allergies  Allergen Reactions   Augmentin [Amoxicillin-Pot Clavulanate] Rash    ROS As per HPI  PE:    10/20/2021    1:26 PM 04/06/2021   10:24 AM 01/31/2021    9:11 AM  Vitals with BMI  Height '5\' 3"'$  '5\' 3"'$  '5\' 3"'$   Weight 185 lbs 3 oz 186 lbs 13 oz 186 lbs 3 oz  BMI 32.81 97.3 53.29  Systolic 924 268 341  Diastolic 77 83 80  Pulse 69 79 73     Physical Exam  Gen: Alert, well appearing.  Patient is oriented to person, place, time, and situation. AFFECT: pleasant, lucid thought and speech. Mild TTP diffusely in lateral lumbar soft tissues, no facet jt or midline tenderness. No SI tenderness. +TTP over sciatic notch on L side Bilat troch TTP L knee with intact ROM although pain elicited with full extension and full flexion. Subtle left knee effusion.  No warmth or erythema. She has some joint line tenderness laterally, also mildly tender over the popliteal fossa where she has an obvious focal cystic swelling consistent with Baker's cyst.  This is not pulsatile.  LABS:  Last CBC Lab Results  Component Value Date   WBC 4.7 05/30/2020   HGB 12.8 05/30/2020   HCT 39.2 05/30/2020   MCV 87.3 05/30/2020   RDW 13.3 05/30/2020   PLT 233.0 96/22/2979   Last metabolic panel Lab Results  Component Value Date   GLUCOSE 88 05/30/2020   NA 139 05/30/2020   K 3.9 05/30/2020   CL 100 05/30/2020   CO2 32 05/30/2020   BUN 22 05/30/2020   CREATININE 0.69 05/30/2020   CALCIUM 9.1 05/30/2020   PROT 6.6 05/30/2020   ALBUMIN 3.4 (L) 05/30/2020   BILITOT 0.8 05/30/2020   ALKPHOS 63 05/30/2020   AST 16 05/30/2020   ALT 21 05/30/2020   IMPRESSION AND PLAN:  #1 acute/subacute left knee pain.  This started after a sprain/contusion type injury per her description. She does have an effusion.  Bedside ultrasound showed anechoic fluid in suprapatellar pouch as well as tracking along the lateral aspect of the patellofemoral joint.  Lateral meniscus appears  degenerative, no discrete tear visualized, no hyperemia. Moderate sized Baker's cyst is present.  Obtain left knee plain films.  She has been taking anti-inflammatory without much success. Steroid injection left knee today.  Ultrasound-guided injection is preferred based on studies that show increased duration, increased effect, greater accuracy, decreased procedural pain, increased response rate, and decreased cost with ultrasound-guided versus blind injection.  Procedure: Real-time ultrasound guided injection of left knee suprapatellar pouch, lateral approach. Device: [employer] informed consent obtained.  Timeout conducted.  No overlying erythema, induration, or other signs of local infection. After sterile prep with Betadine, injected 3 mL of 1% plain lidocaine for anesthesia, then a mixture of 40 mg triamcinolone +3 cc of 1% plain lidocaine using 25-gauge 1-1/2" needle Injectate seen filling suprapatellar pouch. Patient tolerated the procedure well.  No immediate complications.  Post-injection care discussed. Advised to call if fever/chills, erythema, drainage, or persistent bleeding.  Impression: Technically successful ultrasound-guided injection.  Hopefully a lot of her back and hips pain has been due to mechanical changes secondary to her knee injury lately.  Hopefully we will see these improve as her knee improves.  We will await x-ray, likely will have her start physical therapy. We will also need to get her a hinged knee brace to use until I see her again in 2 weeks.  An After Visit Summary was printed and given to the patient.  FOLLOW UP: Return in about 2 weeks (around 11/03/2021) for f/u left knee.  Signed:  Crissie Sickles, MD           10/20/2021

## 2021-10-20 NOTE — Addendum Note (Signed)
Addended by: Deveron Furlong D on: 10/20/2021 05:01 PM   Modules accepted: Orders

## 2021-10-24 ENCOUNTER — Ambulatory Visit (INDEPENDENT_AMBULATORY_CARE_PROVIDER_SITE_OTHER)
Admission: RE | Admit: 2021-10-24 | Discharge: 2021-10-24 | Disposition: A | Discharge status: Home / Self Care | Payer: BC Managed Care – PPO | Source: Ambulatory Visit | Attending: Family Medicine | Admitting: Family Medicine

## 2021-10-24 DIAGNOSIS — M25562 Pain in left knee: Secondary | ICD-10-CM

## 2021-10-25 ENCOUNTER — Encounter: Payer: Self-pay | Admitting: Family Medicine

## 2021-11-01 ENCOUNTER — Encounter: Payer: Self-pay | Admitting: Family Medicine

## 2021-11-01 ENCOUNTER — Telehealth (INDEPENDENT_AMBULATORY_CARE_PROVIDER_SITE_OTHER): Payer: BC Managed Care – PPO | Admitting: Family Medicine

## 2021-11-01 VITALS — BP 128/69 | HR 80 | Wt 181.2 lb

## 2021-11-01 DIAGNOSIS — M1712 Unilateral primary osteoarthritis, left knee: Secondary | ICD-10-CM | POA: Diagnosis not present

## 2021-11-01 NOTE — Progress Notes (Signed)
Virtual Visit via Video Note  I connected with Laura Green on 11/01/21 at  2:40 PM EDT by a video enabled telemedicine application and verified that I am speaking with the correct person using two identifiers.  Location patient: [place] Location provider:work or home office Persons participating in the virtual visit: patient, provider  I discussed the limitations and requested verbal permission for telemedicine visit. The patient expressed understanding and agreed to proceed.  CC: 2 week f/u knee pain A/P as of last visit: "#1 acute/subacute left knee pain.  This started after a sprain/contusion type injury per her description. She does have an effusion.  Bedside ultrasound showed anechoic fluid in suprapatellar pouch as well as tracking along the lateral aspect of the patellofemoral joint.  Lateral meniscus appears degenerative, no discrete tear visualized, no hyperemia. Moderate sized Baker's cyst is present.  Obtain left knee plain films.  She has been taking anti-inflammatory without much success. Steroid injection left knee today.   Hopefully a lot of her back and hips pain has been due to mechanical changes secondary to her knee injury lately.  Hopefully we will see these improve as her knee improves.  We will await x-ray, likely will have her start physical therapy. We will also need to get her a hinged knee brace to use until I see her again in 2 weeks"  INTERIM HX: Her left knee x-rays on 10/24/21 showed tricompartmental moderate to severe degenerative changes, most prominent along the lateral tibiofemoral compartment.  Her knee pain has diminished significantly and the swelling has as well. She is back on Celebrex and feels this is helping also. No significant problem with pain anywhere else on her body at this time.  Unfortunately she and her husband had to put down there 57 year old dog yesterday.  ROS: See pertinent positives and negatives per HPI.  Past Medical History:   Diagnosis Date   Asthma    Borderline hyperlipidemia 2017/2018   Her 10 yr Framingham risk = 5% in 2018.  LDL improved 02/2017 (on no meds).  Fram risk 03/2018 4.5%. Fram risk 05/2020 6.3%.   Breast mass, right 07/08/2014   bx benign   Colon cancer screening 03/2016   2018 Cologuard negative.  Repeat 05/2019 Neg.   Concussion with no loss of consciousness 04/01/2008   Depression    Hypertension    Obesity    Osteoarthritis of knees, bilateral    mild/mod on plain films 09/2018.  L mod-to-severe 09/2021   Perennial allergic rhinitis    Polyarthralgia    Dr. Ouida Sills did rheum w/u which was neg approx 2012--all this got better with wt loss   Right foot pain    Hammertoe, bunion, and dorsal spur to be treated surgically by podiatrist as of spring 2019    Past Surgical History:  Procedure Laterality Date   CESAREAN SECTION     x 2   DEXA  07/19/15; 11/27/18   2017: 'penia (T-Score -1.4).  11/2018 T score -1.7.  Plan rpt 2 yrs (her GYN has been doing these)   left foot surgery     2nd toe dislocation repair + bunion repair ([facility])   RETINAL LASER PROCEDURE Right 03/12/2019   RETINAL LASER PROCEDURE Left 03/26/2019   ROTATOR CUFF REPAIR  Approx 2009   Right     Current Outpatient Medications:    albuterol (PROVENTIL HFA) 108 (90 Base) MCG/ACT inhaler, 1-2 puffs every 4 hours as needed for wheezing (Patient not taking: Reported on 01/31/2021), Disp: 18  g, Rfl: 0   Calcium Carbonate-Vitamin D 600-400 MG-UNIT chew tablet, Chew 1 tablet by mouth daily., Disp: , Rfl:    celecoxib (CELEBREX) 200 MG capsule, TAKE 1 CAPSULE BY MOUTH EVERY DAY, Disp: 30 capsule, Rfl: 0   cetirizine (ZYRTEC) 10 MG tablet, Take 10 mg by mouth daily., Disp: , Rfl:    cyclobenzaprine (FLEXERIL) 10 MG tablet, Take 1 tablet (10 mg total) by mouth 3 (three) times daily as needed for muscle spasms. May cause drowsiness, can just take 1-2 pills in evening or bedtime., Disp: 30 tablet, Rfl: 0   fluticasone  (FLONASE) 50 MCG/ACT nasal spray, Place 2 sprays into both nostrils daily. (Patient not taking: Reported on 01/31/2021), Disp: 16 g, Rfl: 6   irbesartan-hydrochlorothiazide (AVALIDE) 150-12.5 MG tablet, Take 1 tablet by mouth daily. OFFICE VISIT NEEDED FOR FURTHER REFILLS, Disp: 90 tablet, Rfl: 3   Multiple Vitamin (MULTIVITAMIN) capsule, Take 1 capsule by mouth daily., Disp: , Rfl:    olopatadine (PATANOL) 0.1 % ophthalmic solution, Place 1 drop into both eyes daily as needed., Disp: , Rfl:    phentermine (ADIPEX-P) 37.5 MG tablet, 1 tab po qd, Disp: 90 tablet, Rfl: 1   QVAR REDIHALER 80 MCG/ACT inhaler, TAKE 2 PUFFS BY MOUTH TWICE A DAY, Disp: 10.6 g, Rfl: 11   sodium chloride (MURO 128) 5 % ophthalmic solution, SMARTSIG:In Eye(s), Disp: , Rfl:    vitamin E 100 UNIT capsule, Take 100 Units by mouth daily., Disp: , Rfl:   EXAM:  VITALS per patient if applicable:     5/36/6440    1:26 PM 04/06/2021   10:24 AM 01/31/2021    9:11 AM  Vitals with BMI  Height '5\' 3"'$  '5\' 3"'$  '5\' 3"'$   Weight 185 lbs 3 oz 186 lbs 13 oz 186 lbs 3 oz  BMI 32.81 34.7 42.59  Systolic 563 875 643  Diastolic 77 83 80  Pulse 69 79 73     GENERAL: alert, oriented, appears well and in no acute distress  HEENT: atraumatic, conjunttiva clear, no obvious abnormalities on inspection of external nose and ears  NECK: normal movements of the head and neck  LUNGS: on inspection no signs of respiratory distress, breathing rate appears normal, no obvious gross SOB, gasping or wheezing  CV: no obvious cyanosis  MS: moves all visible extremities without noticeable abnormality  PSYCH/NEURO: pleasant and cooperative, no obvious depression or anxiety, speech and thought processing grossly intact  LABS: none today    Chemistry      Component Value Date/Time   NA 139 05/30/2020 0904   K 3.9 05/30/2020 0904   CL 100 05/30/2020 0904   CO2 32 05/30/2020 0904   BUN 22 05/30/2020 0904   CREATININE 0.69 05/30/2020 0904       Component Value Date/Time   CALCIUM 9.1 05/30/2020 0904   ALKPHOS 63 05/30/2020 0904   AST 16 05/30/2020 0904   ALT 21 05/30/2020 0904   BILITOT 0.8 05/30/2020 0904     Lab Results  Component Value Date   WBC 4.7 05/30/2020   HGB 12.8 05/30/2020   HCT 39.2 05/30/2020   MCV 87.3 05/30/2020   PLT 233.0 05/30/2020   Lab Results  Component Value Date   TSH 2.09 05/30/2020   Lab Results  Component Value Date   CHOL 218 (H) 05/30/2020   HDL 68.60 05/30/2020   LDLCALC 134 (H) 05/30/2020   LDLDIRECT 146.6 09/15/2008   TRIG 75.0 05/30/2020   CHOLHDL 3 05/30/2020  ASSESSMENT AND PLAN:  Discussed the following assessment and plan:  Osteoarthritis multiple sites, improvement with steroid knee injection (left). No changes today.  Continue Celebrex 200 mg a day.   I discussed the assessment and treatment plan with the patient. The patient was provided an opportunity to ask questions and all were answered. The patient agreed with the plan and demonstrated an understanding of the instructions.   F/u: At her earliest convenience for annual physical with labs  Signed:  Crissie Sickles, MD           11/01/2021

## 2021-11-03 ENCOUNTER — Ambulatory Visit: Payer: BC Managed Care – PPO | Admitting: Family Medicine

## 2021-11-18 ENCOUNTER — Other Ambulatory Visit: Payer: Self-pay | Admitting: Family Medicine

## 2021-12-04 DIAGNOSIS — Z79899 Other long term (current) drug therapy: Secondary | ICD-10-CM | POA: Diagnosis not present

## 2021-12-04 DIAGNOSIS — M25562 Pain in left knee: Secondary | ICD-10-CM | POA: Diagnosis not present

## 2021-12-13 DIAGNOSIS — Z79899 Other long term (current) drug therapy: Secondary | ICD-10-CM | POA: Diagnosis not present

## 2021-12-18 DIAGNOSIS — Z01419 Encounter for gynecological examination (general) (routine) without abnormal findings: Secondary | ICD-10-CM | POA: Diagnosis not present

## 2021-12-18 DIAGNOSIS — Z1389 Encounter for screening for other disorder: Secondary | ICD-10-CM | POA: Diagnosis not present

## 2022-01-22 DIAGNOSIS — M25562 Pain in left knee: Secondary | ICD-10-CM | POA: Diagnosis not present

## 2022-02-06 DIAGNOSIS — M25562 Pain in left knee: Secondary | ICD-10-CM | POA: Diagnosis not present

## 2022-02-28 ENCOUNTER — Other Ambulatory Visit: Payer: Self-pay | Admitting: Family Medicine

## 2022-03-01 ENCOUNTER — Ambulatory Visit (INDEPENDENT_AMBULATORY_CARE_PROVIDER_SITE_OTHER): Payer: BC Managed Care – PPO

## 2022-03-01 ENCOUNTER — Ambulatory Visit (INDEPENDENT_AMBULATORY_CARE_PROVIDER_SITE_OTHER): Payer: BC Managed Care – PPO | Admitting: Podiatry

## 2022-03-01 ENCOUNTER — Encounter: Payer: Self-pay | Admitting: Podiatry

## 2022-03-01 DIAGNOSIS — L84 Corns and callosities: Secondary | ICD-10-CM | POA: Diagnosis not present

## 2022-03-01 DIAGNOSIS — M2041 Other hammer toe(s) (acquired), right foot: Secondary | ICD-10-CM

## 2022-03-01 DIAGNOSIS — M7751 Other enthesopathy of right foot: Secondary | ICD-10-CM | POA: Diagnosis not present

## 2022-03-01 MED ORDER — TRIAMCINOLONE ACETONIDE 10 MG/ML IJ SUSP
10.0000 mg | Freq: Once | INTRAMUSCULAR | Status: AC
  Administered 2022-03-01: 10 mg

## 2022-03-02 NOTE — Progress Notes (Signed)
Subjective:   Patient ID: Laura Green, female   DOB: 67 y.o.   MRN: [id number]   HPI Patient presents stating that over the last few months she has developed a lot of pain in the right second digit and she had had previous surgery done by me around 4 years ago.  Patient states that it has been very sore and she is not sure if something may have happened.  Patient does not smoke likes to be active   Review of Systems  All other systems reviewed and are negative.       Objective:  Physical Exam Vitals and nursing note reviewed.  Constitutional:      Appearance: She is well-developed.  Pulmonary:     Effort: Pulmonary effort is normal.  Musculoskeletal:        General: Normal range of motion.  Skin:    General: Skin is warm.  Neurological:     Mental Status: She is alert.     Neurovascular status found to be intact muscle strength was found to be adequate range of motion adequate.  Patient is found to have inflammation of the second digit right foot on the lateral inner phalangeal joint with fluid buildup and pain with pressure.  Patient is found to have good digital perfusion well oriented x 3 history of surgery with moderate rotation of the third digit right distal     Assessment:  Appears to be more of an acute inflammatory capsulitis of the inner phalangeal joint digit to right with structural changes of the third toe pressing against the second digit causing some of the stress     Plan:  Patient PA x-ray reviewed all structure discussed at great length including surgery that was performed 4 years ago that looks good with a distal rotation third digit right that may need to be corrected which I explained.  Today were trying conservative care and I did sterile prep and injected the inner phalangeal joint digit to right 2 mg Dexasone Kenalog 5 mg Xylocaine debrided the lesion fully applied cushioning to the toe to take pressure off of it and will be seen back on an as-needed  basis  X-rays indicate moderate rotation digit 3 right history of structural bunion and digital correction which looks good right foot

## 2022-03-08 ENCOUNTER — Ambulatory Visit: Payer: BC Managed Care – PPO | Admitting: Family Medicine

## 2022-03-19 ENCOUNTER — Ambulatory Visit (INDEPENDENT_AMBULATORY_CARE_PROVIDER_SITE_OTHER): Payer: BC Managed Care – PPO | Admitting: Podiatry

## 2022-03-19 DIAGNOSIS — L02611 Cutaneous abscess of right foot: Secondary | ICD-10-CM | POA: Diagnosis not present

## 2022-03-19 DIAGNOSIS — L03031 Cellulitis of right toe: Secondary | ICD-10-CM | POA: Diagnosis not present

## 2022-03-19 DIAGNOSIS — M2041 Other hammer toe(s) (acquired), right foot: Secondary | ICD-10-CM

## 2022-03-19 MED ORDER — DOXYCYCLINE HYCLATE 100 MG PO TABS: 100.0000 mg | ORAL_TABLET | Freq: Two times a day (BID) | ORAL | 1 refills | Status: DC

## 2022-03-19 NOTE — Progress Notes (Signed)
Subjective:   Patient ID: Laura Green, female   DOB: 67 y.o.   MRN: [id number]   HPI Patient states over the last few days she started develop a lot of pain in her second digit right foot and it seemed to do well for short period of time and now has started up   ROS      Objective:  Physical Exam  Neurovascular status intact with inflammation of the lateral side of digit to right with keratotic lesion formation localized no proximal edema erythema drainage noted currently but quite sore in this area     Assessment:  Possibility for low level abscess or low-level localized cellulitis right second toe     Plan:  Sharp sterile debridement accomplished there was a small amount of pus which I cleaned out and working to use a toe separator to create a air between the 2 toes and she will wear open toed shoe begin soaks and I placed her on doxycycline.  Strict instructions if there should be any increase in erythema edema or drainage to let us know immediately and if not this should heal uneventfully with all questions answered today

## 2022-03-29 ENCOUNTER — Other Ambulatory Visit: Payer: Self-pay | Admitting: Family Medicine

## 2022-04-26 ENCOUNTER — Other Ambulatory Visit: Payer: Self-pay | Admitting: Family Medicine

## 2022-04-27 DIAGNOSIS — M25532 Pain in left wrist: Secondary | ICD-10-CM | POA: Diagnosis not present

## 2022-04-27 DIAGNOSIS — M1812 Unilateral primary osteoarthritis of first carpometacarpal joint, left hand: Secondary | ICD-10-CM | POA: Diagnosis not present

## 2022-05-13 DIAGNOSIS — X58XXXA Exposure to other specified factors, initial encounter: Secondary | ICD-10-CM | POA: Diagnosis not present

## 2022-05-13 DIAGNOSIS — S91301A Unspecified open wound, right foot, initial encounter: Secondary | ICD-10-CM | POA: Diagnosis not present

## 2022-05-16 ENCOUNTER — Ambulatory Visit (INDEPENDENT_AMBULATORY_CARE_PROVIDER_SITE_OTHER): Payer: BC Managed Care – PPO

## 2022-05-16 ENCOUNTER — Ambulatory Visit (INDEPENDENT_AMBULATORY_CARE_PROVIDER_SITE_OTHER): Payer: BC Managed Care – PPO | Admitting: Podiatry

## 2022-05-16 DIAGNOSIS — L03031 Cellulitis of right toe: Secondary | ICD-10-CM

## 2022-05-16 DIAGNOSIS — M2041 Other hammer toe(s) (acquired), right foot: Secondary | ICD-10-CM | POA: Diagnosis not present

## 2022-05-16 DIAGNOSIS — D169 Benign neoplasm of bone and articular cartilage, unspecified: Secondary | ICD-10-CM | POA: Diagnosis not present

## 2022-05-16 DIAGNOSIS — L02611 Cutaneous abscess of right foot: Secondary | ICD-10-CM | POA: Diagnosis not present

## 2022-05-16 MED ORDER — DOXYCYCLINE HYCLATE 100 MG PO TABS: 100.0000 mg | ORAL_TABLET | Freq: Two times a day (BID) | ORAL | 1 refills | Status: DC

## 2022-05-16 NOTE — Progress Notes (Signed)
Subjective:   Patient ID: Laura Green, female   DOB: 67 y.o.   MRN: [id number]   HPI Patient states she was doing fine that her toe flared up again and it is driving her crazy and also the third digit has developed a painful area on   ROS      Objective:  Physical Exam  Neurovascular status intact with inflammation keratotic lesion lateral side digit to right medial side digit 3 right with rotation of the third toe pressing against the second toe.  There is also some inflammation plantarly within the digit second itself but it is localized with no erythema or other pathology associated with it.  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  Patient or stands no guarantee this will do it wants the procedure and was given consent form that she read over extensively and signed after we reviewed.  Patient scheduled for outpatient surgery I want her to stay on the doxycycline for approximately 3 weeks and I wrote her for 2 more weeks of this and if any changes were to occur prior to procedure she is to come back  X-rays indicate no signs of bony changes no indications of osteolysis digit to right or other pathology

## 2022-05-18 ENCOUNTER — Encounter: Payer: Self-pay | Admitting: Family Medicine

## 2022-05-18 ENCOUNTER — Ambulatory Visit (INDEPENDENT_AMBULATORY_CARE_PROVIDER_SITE_OTHER): Payer: BC Managed Care – PPO | Admitting: Family Medicine

## 2022-05-18 ENCOUNTER — Telehealth: Payer: Self-pay | Admitting: Urology

## 2022-05-18 VITALS — BP 116/76 | HR 93 | Temp 97.7°F | Ht 63.0 in | Wt 187.0 lb

## 2022-05-18 DIAGNOSIS — Z1231 Encounter for screening mammogram for malignant neoplasm of breast: Secondary | ICD-10-CM | POA: Diagnosis not present

## 2022-05-18 DIAGNOSIS — Z0000 Encounter for general adult medical examination without abnormal findings: Secondary | ICD-10-CM | POA: Diagnosis not present

## 2022-05-18 DIAGNOSIS — Z7689 Persons encountering health services in other specified circumstances: Secondary | ICD-10-CM

## 2022-05-18 DIAGNOSIS — I10 Essential (primary) hypertension: Secondary | ICD-10-CM | POA: Diagnosis not present

## 2022-05-18 DIAGNOSIS — Z23 Encounter for immunization: Secondary | ICD-10-CM | POA: Diagnosis not present

## 2022-05-18 DIAGNOSIS — Z79899 Other long term (current) drug therapy: Secondary | ICD-10-CM

## 2022-05-18 DIAGNOSIS — E785 Hyperlipidemia, unspecified: Secondary | ICD-10-CM

## 2022-05-18 DIAGNOSIS — R7301 Impaired fasting glucose: Secondary | ICD-10-CM | POA: Diagnosis not present

## 2022-05-18 DIAGNOSIS — Z1211 Encounter for screening for malignant neoplasm of colon: Secondary | ICD-10-CM

## 2022-05-18 MED ORDER — CELECOXIB 200 MG PO CAPS: 200.0000 mg | ORAL_CAPSULE | Freq: Every day | ORAL | 1 refills | Status: DC

## 2022-05-18 MED ORDER — QVAR REDIHALER 80 MCG/ACT IN AERB: INHALATION_SPRAY | RESPIRATORY_TRACT | 11 refills | Status: AC

## 2022-05-18 MED ORDER — PHENTERMINE HCL 37.5 MG PO TABS: ORAL_TABLET | ORAL | 0 refills | Status: DC

## 2022-05-18 MED ORDER — IRBESARTAN-HYDROCHLOROTHIAZIDE 150-12.5 MG PO TABS: ORAL_TABLET | ORAL | 1 refills | Status: DC

## 2022-05-18 NOTE — Patient Instructions (Signed)

## 2022-05-18 NOTE — Telephone Encounter (Signed)
DOS - 05/29/22  HAMMERTOE REPAIR 3RD RIGHT --- [id number] EXOSTECTOMY 2ND RIGHT --- [id number]  [employer]  SPOKE WITH CHELSEA WITH [employer] AND SHE STATED THAT FOR CPT CODES 01027 AND 25366 NO PRIOR AUTH IS REQUIRED.  REF # [id number] W. 05/18/22 AT 8:15 AM EST

## 2022-05-18 NOTE — Progress Notes (Signed)
Office Note 05/18/2022  CC:  Chief Complaint  Patient presents with   Annual Exam    Pt is not fasting    HPI:  Patient is a 67 y.o. female who is here for annual health maintenance exam and follow-up hypertension and obesity/weight management.  Due to this is doing well. She has chronic left knee and left thumb pain.  She is followed by Dr. Cherly Anderson for her knee, says she has bone-on-bone arthritis.  A relatively recent knee injection by him helped. She is followed by hand surgeon and he did a first [place] injection and patient states that significantly helped.  Home blood pressures have been normal. She continues to take phentermine most days.  PMP AWARE reviewed today: most recent rx for phentermine was filled 04/12/2022, # 44, rx by me. No red flags.  Past Medical History:  Diagnosis Date   Asthma    Borderline hyperlipidemia 2017/2018   Her 10 yr Framingham risk = 5% in 2018.  LDL improved 02/2017 (on no meds).  Fram risk 03/2018 4.5%. Fram risk 05/2020 6.3%.   Breast mass, right 07/08/2014   bx benign   Colon cancer screening 03/2016   2018 Cologuard negative.  Repeat 05/2019 Neg.   Concussion with no loss of consciousness 04/01/2008   Depression    Hypertension    Obesity    Osteoarthritis of knees, bilateral    mild/mod on plain films 09/2018.  L mod-to-severe 09/2021   Perennial allergic rhinitis    Polyarthralgia    Dr. Ouida Sills did rheum w/u which was neg approx 2012--all this got better with wt loss   Right foot pain    Hammertoe, bunion, and dorsal spur to be treated surgically by podiatrist as of spring 2019    Past Surgical History:  Procedure Laterality Date   CESAREAN SECTION     x 2   DEXA  07/19/15; 11/27/18   2017: 'penia (T-Score -1.4).  11/2018 T score -1.7.  Plan rpt 2 yrs (her GYN has been doing these)   left foot surgery     2nd toe dislocation repair + bunion repair ([facility])   RETINAL LASER PROCEDURE Right 03/12/2019   RETINAL LASER  PROCEDURE Left 03/26/2019   ROTATOR CUFF REPAIR  Approx 2009   Right    Family History  Problem Relation Age of Onset   Arthritis Mother    Colon cancer Mother    COPD Father    Hyperlipidemia Father     Social History   Socioeconomic History   Marital status: Married    Spouse name: Not on file   Number of children: Not on file   Years of education: Not on file   Highest education level: Not on file  Occupational History   Not on file  Tobacco Use   Smoking status: Never   Smokeless tobacco: Never  Vaping Use   Vaping Use: Never used  Substance and Sexual Activity   Alcohol use: No   Drug use: No   Sexual activity: Yes  Other Topics Concern   Not on file  Social History Narrative   Married 40+ yrs, two daughters, 3 grandchildren.Marland Kitchen   [place] prior to [place]--[place] since 1990s.   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   No T/A/Ds.   Exercise: Zumba weekly.   Social Determinants of Health   Financial Resource Strain: Not on file  Food Insecurity: Not on file  Transportation Needs: Not on file  Physical Activity:  Not on file  Stress: Not on file  Social Connections: Not on file  Intimate Partner Violence: Not on file    Outpatient Medications Prior to Visit  Medication Sig Dispense Refill   Calcium Carbonate-Vitamin D 600-400 MG-UNIT chew tablet Chew 1 tablet by mouth daily.     cetirizine (ZYRTEC) 10 MG tablet Take 10 mg by mouth daily.     doxycycline (VIBRA-TABS) 100 MG tablet Take 1 tablet (100 mg total) by mouth 2 (two) times daily. 30 tablet 1   Multiple Vitamin (MULTIVITAMIN) capsule Take 1 capsule by mouth daily.     Probiotic Product (PROBIOTIC GUMMIES PO) Take by mouth in the morning and at bedtime.     vitamin E 100 UNIT capsule Take 100 Units by mouth daily.     irbesartan-hydrochlorothiazide (AVALIDE) 150-12.5 MG tablet Take 1 tablet by mouth daily. OFFICE VISIT NEEDED FOR FURTHER REFILLS 90 tablet 3   QVAR REDIHALER 80  MCG/ACT inhaler TAKE 2 PUFFS BY MOUTH TWICE A DAY 10.6 g 11   fluticasone (FLONASE) 50 MCG/ACT nasal spray Place 2 sprays into both nostrils daily. (Patient not taking: Reported on 01/31/2021) 16 g 6   olopatadine (PATANOL) 0.1 % ophthalmic solution Place 1 drop into both eyes daily as needed. (Patient not taking: Reported on 05/18/2022)     celecoxib (CELEBREX) 200 MG capsule TAKE 1 CAPSULE BY MOUTH EVERY DAY 30 capsule 0   cyclobenzaprine (FLEXERIL) 10 MG tablet Take 1 tablet (10 mg total) by mouth 3 (three) times daily as needed for muscle spasms. May cause drowsiness, can just take 1-2 pills in evening or bedtime. (Patient not taking: Reported on 05/18/2022) 30 tablet 0   doxycycline (VIBRA-TABS) 100 MG tablet Take 1 tablet (100 mg total) by mouth 2 (two) times daily. (Patient not taking: Reported on 05/18/2022) 20 tablet 1   phentermine (ADIPEX-P) 37.5 MG tablet 1 tab po qd 90 tablet 1   sodium chloride (MURO 128) 5 % ophthalmic solution SMARTSIG:In Eye(s) (Patient not taking: Reported on 05/18/2022)     No facility-administered medications prior to visit.    Allergies  Allergen Reactions   Augmentin [Amoxicillin-Pot Clavulanate] Rash   Review of Systems  Constitutional:  Negative for appetite change, chills, fatigue and fever.  HENT:  Negative for congestion, dental problem, ear pain and sore throat.   Eyes:  Negative for discharge, redness and visual disturbance.  Respiratory:  Negative for cough, chest tightness, shortness of breath and wheezing.   Cardiovascular:  Negative for chest pain, palpitations and leg swelling.  Gastrointestinal:  Negative for abdominal pain, blood in stool, diarrhea, nausea and vomiting.  Genitourinary:  Negative for difficulty urinating, dysuria, flank pain, frequency, hematuria and urgency.  Musculoskeletal:  Positive for arthralgias (left thumb and left knee--chronic). Negative for back pain, joint swelling, myalgias and neck stiffness.  Skin:  Negative for  pallor and rash.  Neurological:  Negative for dizziness, speech difficulty, weakness and headaches.  Hematological:  Negative for adenopathy. Does not bruise/bleed easily.  Psychiatric/Behavioral:  Negative for confusion and sleep disturbance. The patient is not nervous/anxious.     PE;    05/18/2022    3:35 PM 11/01/2021    2:33 PM 10/20/2021    1:26 PM  Vitals with BMI  Height 5\' 3"   5\' 3"   Weight 187 lbs 181 lbs 3 oz 185 lbs 3 oz  BMI 33.13 [id number]  Systolic [id number]  Diastolic 76 69 77  Pulse 93 80 69  Exam chaperoned  by female CMA  Gen: Alert, well appearing.  Patient is oriented to person, place, time, and situation. AFFECT: pleasant, lucid thought and speech. ENT: Ears: EACs clear, normal epithelium.  TMs with good light reflex and landmarks bilaterally.  Eyes: no injection, icteris, swelling, or exudate.  EOMI, PERRLA. Nose: no drainage or turbinate edema/swelling.  No injection or focal lesion.  Mouth: lips without lesion/swelling.  Oral mucosa pink and moist.  Dentition intact and without obvious caries or gingival swelling.  Oropharynx without erythema, exudate, or swelling.  Neck: supple/nontender.  No LAD, mass, or TM.  Carotid pulses 2+ bilaterally, without bruits. CV: RRR, no m/r/g.   LUNGS: CTA bilat, nonlabored resps, good aeration in all lung fields. ABD: soft, NT, ND, BS normal.  No hepatospenomegaly or mass.  No bruits. EXT: no clubbing, cyanosis, or edema.  Musculoskeletal: no joint swelling, erythema, warmth, or tenderness.  ROM of all joints intact. Skin - no sores or suspicious lesions or rashes or color changes  Pertinent labs:  Lab Results  Component Value Date   TSH 2.09 05/30/2020   Lab Results  Component Value Date   WBC 4.7 05/30/2020   HGB 12.8 05/30/2020   HCT 39.2 05/30/2020   MCV 87.3 05/30/2020   PLT 233.0 05/30/2020   Lab Results  Component Value Date   CREATININE 0.69 05/30/2020   BUN 22 05/30/2020   NA 139 05/30/2020   K 3.9  05/30/2020   CL 100 05/30/2020   CO2 32 05/30/2020   Lab Results  Component Value Date   ALT 21 05/30/2020   AST 16 05/30/2020   ALKPHOS 63 05/30/2020   BILITOT 0.8 05/30/2020   Lab Results  Component Value Date   CHOL 218 (H) 05/30/2020   Lab Results  Component Value Date   HDL 68.60 05/30/2020   Lab Results  Component Value Date   LDLCALC 134 (H) 05/30/2020   Lab Results  Component Value Date   TRIG 75.0 05/30/2020   Lab Results  Component Value Date   CHOLHDL 3 05/30/2020   ASSESSMENT AND PLAN:   #1 health maintenance exam: Reviewed age and gender appropriate health maintenance issues (prudent diet, regular exercise, health risks of tobacco and excessive alcohol, use of seatbelts, fire alarms in home, use of sunscreen).  Also reviewed age and gender appropriate health screening as well as vaccine recommendations. Vaccines: Prevnar 20-->given today.  Otherwise ALL UTD. Labs: fasting HP labs ordered. Cervical ca screening: Normal Pap at GYN 2022.  She had annual GYN visit recently. Breast ca screening: mammogram to be scheduled. Colon ca screening: repeat cologuard ordered.  #2 hypertension, well-controlled on irbesartan/HCTZ 150/12.5 daily. Electrolytes and creatinine today.  3.  Obesity class I, weight management. Phentermine 37.5 daily long-term, doing well.  An After Visit Summary was printed and given to the patient.  FOLLOW UP:  Return in about 6 months (around 11/18/2022) for routine chronic illness f/u.  Signed:  Crissie Sickles, MD           05/18/2022

## 2022-05-23 LAB — COMPREHENSIVE METABOLIC PANEL
AG Ratio: 1 (calc) (ref 1.0–2.5)
ALT: 14 U/L (ref 6–29)
AST: 14 U/L (ref 10–35)
Albumin: 3.4 g/dL — ABNORMAL LOW (ref 3.6–5.1)
Alkaline phosphatase (APISO): 69 U/L (ref 37–153)
BUN: 23 mg/dL (ref 7–25)
CO2: 25 mmol/L (ref 20–32)
Calcium: 8.9 mg/dL (ref 8.6–10.4)
Chloride: 103 mmol/L (ref 98–110)
Creat: 0.7 mg/dL (ref 0.50–1.05)
Globulin: 3.3 g/dL (calc) (ref 1.9–3.7)
Glucose, Bld: 114 mg/dL — ABNORMAL HIGH (ref 65–99)
Potassium: 3.4 mmol/L — ABNORMAL LOW (ref 3.5–5.3)
Sodium: 141 mmol/L (ref 135–146)
Total Bilirubin: 0.5 mg/dL (ref 0.2–1.2)
Total Protein: 6.7 g/dL (ref 6.1–8.1)

## 2022-05-23 LAB — LIPID PANEL
Cholesterol: 248 mg/dL — ABNORMAL HIGH (ref <200)
HDL: 75 mg/dL (ref >=50)
LDL Cholesterol (Calc): 148 mg/dL (calc) — ABNORMAL HIGH
Non-HDL Cholesterol (Calc): 173 mg/dL (calc) — ABNORMAL HIGH (ref <130)
Total CHOL/HDL Ratio: 3.3 (calc) (ref <5.0)
Triglycerides: 130 mg/dL (ref <150)

## 2022-05-23 LAB — CBC WITH DIFFERENTIAL/PLATELET
Absolute Monocytes: 599 cells/uL (ref 200–950)
Basophils Absolute: 38 cells/uL (ref 0–200)
Basophils Relative: 0.6 %
Eosinophils Absolute: 132 cells/uL (ref 15–500)
Eosinophils Relative: 2.1 %
HCT: 39 % (ref 35.0–45.0)
Hemoglobin: 13.2 g/dL (ref 11.7–15.5)
Lymphs Abs: 1367 cells/uL (ref 850–3900)
MCH: 29.5 pg (ref 27.0–33.0)
MCHC: 33.8 g/dL (ref 32.0–36.0)
MCV: 87.2 fL (ref 80.0–100.0)
MPV: 10.8 fL (ref 7.5–12.5)
Monocytes Relative: 9.5 %
Neutro Abs: 4164 cells/uL (ref 1500–7800)
Neutrophils Relative %: 66.1 %
Platelets: 270 10*3/uL (ref 140–400)
RBC: 4.47 10*6/uL (ref 3.80–5.10)
RDW: 12.2 % (ref 11.0–15.0)
Total Lymphocyte: 21.7 %
WBC: 6.3 10*3/uL (ref 3.8–10.8)

## 2022-05-23 LAB — HEMOGLOBIN A1C W/OUT EAG: Hgb A1c MFr Bld: 5.6 % of total Hgb (ref <5.7)

## 2022-05-23 LAB — TSH: TSH: 1.72 mIU/L (ref 0.40–4.50)

## 2022-05-28 MED ORDER — HYDROCODONE-ACETAMINOPHEN 10-325 MG PO TABS: 1.0000 | ORAL_TABLET | Freq: Three times a day (TID) | ORAL | 0 refills | Status: AC | PRN

## 2022-05-28 NOTE — Addendum Note (Signed)
Addended by: Wallene Huh on: 05/28/2022 01:49 PM   Modules accepted: Orders

## 2022-05-29 ENCOUNTER — Encounter: Payer: Self-pay | Admitting: Podiatry

## 2022-05-29 DIAGNOSIS — M2041 Other hammer toe(s) (acquired), right foot: Secondary | ICD-10-CM | POA: Diagnosis not present

## 2022-05-29 DIAGNOSIS — M25774 Osteophyte, right foot: Secondary | ICD-10-CM | POA: Diagnosis not present

## 2022-05-29 DIAGNOSIS — M85871 Other specified disorders of bone density and structure, right ankle and foot: Secondary | ICD-10-CM | POA: Diagnosis not present

## 2022-05-30 ENCOUNTER — Other Ambulatory Visit: Payer: Self-pay | Admitting: Podiatry

## 2022-05-30 ENCOUNTER — Telehealth: Payer: Self-pay | Admitting: *Deleted

## 2022-05-30 MED ORDER — ONDANSETRON HCL 4 MG PO TABS: 4.0000 mg | ORAL_TABLET | Freq: Three times a day (TID) | ORAL | 0 refills | Status: DC | PRN

## 2022-05-30 NOTE — Telephone Encounter (Signed)
Patient is calling to request something for nausea after taking her pain medicine,please advise.

## 2022-05-30 NOTE — Telephone Encounter (Signed)
I sent in medicine.

## 2022-06-04 ENCOUNTER — Encounter: Payer: Self-pay | Admitting: Podiatry

## 2022-06-04 ENCOUNTER — Ambulatory Visit (INDEPENDENT_AMBULATORY_CARE_PROVIDER_SITE_OTHER): Payer: BC Managed Care – PPO

## 2022-06-04 ENCOUNTER — Ambulatory Visit: Payer: BC Managed Care – PPO

## 2022-06-04 ENCOUNTER — Ambulatory Visit (INDEPENDENT_AMBULATORY_CARE_PROVIDER_SITE_OTHER): Payer: BC Managed Care – PPO | Admitting: Podiatry

## 2022-06-04 DIAGNOSIS — D169 Benign neoplasm of bone and articular cartilage, unspecified: Secondary | ICD-10-CM | POA: Diagnosis not present

## 2022-06-04 DIAGNOSIS — M2041 Other hammer toe(s) (acquired), right foot: Secondary | ICD-10-CM

## 2022-06-05 NOTE — Progress Notes (Signed)
Subjective:   Patient ID: Laura Green, female   DOB: 67 y.o.   MRN: [id number]   HPI Patient states doing very well after surgery   ROS      Objective:  Physical Exam  Neurovascular status intact negative Denna Haggard' sign noted wound edges well coapted with digit in good alignment second and third toe stitches in place     Assessment:  Doing well post exostectomy post arthroplasty right     Plan:  H&P x-ray reviewed and went ahead today reapplied sterile dressing continue elevation immobilization reappoint 2 weeks suture removal earlier if needed  X-rays indicate satisfactory resection of bone digits 2 3 right good alignment noted

## 2022-06-06 ENCOUNTER — Telehealth: Payer: Self-pay | Admitting: Podiatry

## 2022-06-06 NOTE — Telephone Encounter (Signed)
Patient called and wanted to know if she should continue taking the doxycycline  that was prescribed on 05/16/2022.  Please advise

## 2022-06-07 NOTE — Telephone Encounter (Signed)
no

## 2022-06-10 DIAGNOSIS — Z1211 Encounter for screening for malignant neoplasm of colon: Secondary | ICD-10-CM | POA: Diagnosis not present

## 2022-06-13 ENCOUNTER — Ambulatory Visit (INDEPENDENT_AMBULATORY_CARE_PROVIDER_SITE_OTHER): Payer: BC Managed Care – PPO

## 2022-06-13 ENCOUNTER — Ambulatory Visit (INDEPENDENT_AMBULATORY_CARE_PROVIDER_SITE_OTHER): Payer: BC Managed Care – PPO | Admitting: Podiatry

## 2022-06-13 DIAGNOSIS — Z9889 Other specified postprocedural states: Secondary | ICD-10-CM | POA: Diagnosis not present

## 2022-06-14 ENCOUNTER — Ambulatory Visit (INDEPENDENT_AMBULATORY_CARE_PROVIDER_SITE_OTHER): Payer: BC Managed Care – PPO | Admitting: Podiatry

## 2022-06-14 ENCOUNTER — Encounter: Payer: Self-pay | Admitting: Podiatry

## 2022-06-14 DIAGNOSIS — M2041 Other hammer toe(s) (acquired), right foot: Secondary | ICD-10-CM

## 2022-06-14 NOTE — Progress Notes (Signed)
Subjective:   Patient ID: Laura Green, female   DOB: 67 y.o.   MRN: [id number]   HPI Patient states overall doing very well very pleased   ROS      Objective:  Physical Exam  Neurovascular status intact negative Denna Haggard' sign noted wound edges well coapted toes in good alignment     Assessment:  Doing well post arthroplasty exostectomy     Plan:  Stitches are removed wound edges coapted well instructed on continued compression and wide shoes and gradual return to normal activities.  Patient will be seen back as needed  X-rays indicate that the resection of bone looks good good alignment noted

## 2022-06-15 NOTE — Progress Notes (Signed)
Subjective:   Patient ID: Laura Green, female   DOB: 67 y.o.   MRN: [id number]   HPI Patient presents stating that she when she got in the shower this morning her third toe started to bleed and she got scared   ROS      Objective:  Physical Exam  Neurovascular status intact with probable slight trauma to the third digit left on the lateral side but it appears to be healing fine no erythema around the area     Assessment:  Probable trauma to the incision site third digit left     Plan:  Went ahead today applied sterile compression dressing to the toe for the next 2 days and that should be the end of the problem reappoint if it gives her trouble

## 2022-06-18 ENCOUNTER — Encounter: Payer: BC Managed Care – PPO | Admitting: Podiatry

## 2022-06-19 ENCOUNTER — Encounter: Payer: Self-pay | Admitting: Family Medicine

## 2022-06-19 LAB — COLOGUARD: COLOGUARD: NEGATIVE

## 2022-06-20 ENCOUNTER — Encounter: Payer: BC Managed Care – PPO | Admitting: Podiatry

## 2022-06-20 ENCOUNTER — Telehealth: Payer: Self-pay | Admitting: Podiatry

## 2022-06-20 NOTE — Telephone Encounter (Signed)
Pt called and has taken the bandage off the 3rd toe on right foot looks like a blister has came up and it is black and blue.   I have her scheduled to see you this Friday.

## 2022-06-22 ENCOUNTER — Ambulatory Visit (INDEPENDENT_AMBULATORY_CARE_PROVIDER_SITE_OTHER): Payer: BC Managed Care – PPO

## 2022-06-22 ENCOUNTER — Encounter: Payer: Self-pay | Admitting: Podiatry

## 2022-06-22 ENCOUNTER — Ambulatory Visit (INDEPENDENT_AMBULATORY_CARE_PROVIDER_SITE_OTHER): Payer: BC Managed Care – PPO | Admitting: Podiatry

## 2022-06-22 DIAGNOSIS — M2041 Other hammer toe(s) (acquired), right foot: Secondary | ICD-10-CM

## 2022-06-22 NOTE — Progress Notes (Signed)
Patient presents subjective:   Patient ID: Laura Green, female   DOB: 67 y.o.   MRN: [id number]   HPI Learned about drainage in the right third toe that occurred for a short period of time but is not present now.  Also has keratotic tissue and just concerned about digital structure   ROS      Objective:  Physical Exam  Neurovascular status intact negative Denna Haggard' sign noted patient is concerned about the alignment and third toe and possible bone issue and keratotic tissue formation with mild swelling     Assessment:  At this point did not see any drainage and I just think it is because of the new position of the toe it does not appear to be infected cannot rule out pathology with good alignment of the second and third toes currently     Plan:  Precautionary x-ray taken to rule out any osteolysis and do not recommend antibiotics unless any further redness or drainage occurs but this is relatively normal should get better and debrided some tissue second toe courtesy with no pathology.  Patient is discharged reappoint as needed  X-rays indicate satisfactory resection of bone and I do not see indications of osteolysis currently

## 2022-06-30 DIAGNOSIS — Z1231 Encounter for screening mammogram for malignant neoplasm of breast: Secondary | ICD-10-CM | POA: Diagnosis not present

## 2022-06-30 LAB — HM MAMMOGRAPHY

## 2022-07-03 ENCOUNTER — Telehealth: Payer: Self-pay

## 2022-07-03 DIAGNOSIS — M25562 Pain in left knee: Secondary | ICD-10-CM | POA: Diagnosis not present

## 2022-07-03 NOTE — Telephone Encounter (Signed)
Received Mammogram results from [facility] on 07/03/22. Chart has been updated. Will place on PCP desk for review.

## 2022-08-17 NOTE — Patient Instructions (Signed)

## 2022-08-20 ENCOUNTER — Encounter: Payer: Self-pay | Admitting: Family Medicine

## 2022-08-20 ENCOUNTER — Ambulatory Visit (INDEPENDENT_AMBULATORY_CARE_PROVIDER_SITE_OTHER): Payer: BC Managed Care – PPO | Admitting: Family Medicine

## 2022-08-20 VITALS — BP 156/86 | HR 64 | Temp 98.2°F | Ht 63.0 in | Wt 188.4 lb

## 2022-08-20 DIAGNOSIS — I10 Essential (primary) hypertension: Secondary | ICD-10-CM

## 2022-08-20 DIAGNOSIS — R351 Nocturia: Secondary | ICD-10-CM | POA: Diagnosis not present

## 2022-08-20 DIAGNOSIS — R8290 Unspecified abnormal findings in urine: Secondary | ICD-10-CM | POA: Diagnosis not present

## 2022-08-20 DIAGNOSIS — G4719 Other hypersomnia: Secondary | ICD-10-CM

## 2022-08-20 DIAGNOSIS — R5382 Chronic fatigue, unspecified: Secondary | ICD-10-CM

## 2022-08-20 LAB — POCT URINALYSIS DIPSTICK
Bilirubin, UA: NEGATIVE
Blood, UA: NEGATIVE
Glucose, UA: NEGATIVE
Ketones, UA: NEGATIVE
Leukocytes, UA: NEGATIVE
Nitrite, UA: NEGATIVE
Protein, UA: NEGATIVE
Spec Grav, UA: 1.015 (ref 1.010–1.025)
Urobilinogen, UA: 0.2 E.U./dL
pH, UA: 6 (ref 5.0–8.0)

## 2022-08-20 NOTE — Progress Notes (Signed)
OFFICE VISIT  08/20/2022  CC:  Chief Complaint  Patient presents with   Fatigue    Off and on for 1 year; unable to sleep    Patient is a 67 y.o. female who presents accompanied by her husband Gus for multiple concerns.  HPI: Has long history of having to get up in the night to urinate several times. She has some daytime urgency if she drinks coffee, otherwise okay. She says her urine has a strong odor.  She denies dysuria, gross hematuria, flank pain, or suprapubic pain. Her nocturia is causing a broken sleep pattern and she feels like this is causing her to be tired all day. Another thing that causes her sleep dysfunction is chronic pain in her hands, knees, and feet due to arthritis. She takes Celebrex 200 mg every morning.  Lately she has intermittently taken Tylenol around bedtime to help with her pain.  She does snore but her husband has not noticed any apneic spells. She does not awaken with a gasp in the night.  Has chronic dry mouth.  This does cause her to drink some fluids in the evening.  She takes Zyrtec every day long-term because it helps very well for her severe chronic allergy symptoms. She has not taken her phentermine in a couple of months because it made her mouth dry even more.  She does not check her blood pressure at home.  She occasionally checks it at [facility] when she picks up her medicines but does not recall numbers but does not think it was as high as it is here today.  ROS as above, plus--> no fevers, no CP, no SOB, no wheezing, no cough, no dizziness.  She has occ occipital Has. no rashes, no melena/hematochezia.  No polydipsia or polyphagia. No myalgias . No focal weakness, paresthesias, or tremors.  No acute vision or hearing abnormalities. No recent changes in lower legs. No n/v/d or abd pain.  No palpitations.     PMP AWARE reviewed today: most recent rx for phentermine was filled 07/12/2022, # 90, rx by me. No red flags.   Past Medical History:   Diagnosis Date   Asthma    Borderline hyperlipidemia 2017/2018   Her 10 yr Framingham risk = 5% in 2018.  LDL improved 02/2017 (on no meds).  Fram risk 03/2018 4.5%. Fram risk 05/2020 6.3%.   Breast mass, right 07/08/2014   bx benign   Colon cancer screening 03/2016   2018 Cologuard negative.  Repeat 05/2019 Neg.  05/2022 NEG   Concussion with no loss of consciousness 04/01/2008   Depression    Hypertension    Obesity    Osteoarthritis of knees, bilateral    mild/mod on plain films 09/2018.  L mod-to-severe 09/2021   Perennial allergic rhinitis    Polyarthralgia    Dr. Dareen Piano did rheum w/u which was neg approx 2012--all this got better with wt loss   Right foot pain    Hammertoe, bunion, and dorsal spur to be treated surgically by podiatrist as of spring 2019    Past Surgical History:  Procedure Laterality Date   CESAREAN SECTION     x 2   DEXA  07/19/15; 11/27/18   2017: 'penia (T-Score -1.4).  11/2018 T score -1.7.  Plan rpt 2 yrs (her GYN has been doing these)   left foot surgery     2nd toe dislocation repair + bunion repair ([facility])   RETINAL LASER PROCEDURE Right 03/12/2019   RETINAL LASER  PROCEDURE Left 03/26/2019   ROTATOR CUFF REPAIR  Approx 2009   Right    Outpatient Medications Prior to Visit  Medication Sig Dispense Refill   beclomethasone (QVAR REDIHALER) 80 MCG/ACT inhaler TAKE 2 PUFFS BY MOUTH TWICE A DAY 10.6 g 11   Calcium Carbonate-Vitamin D 600-400 MG-UNIT chew tablet Chew 1 tablet by mouth daily.     celecoxib (CELEBREX) 200 MG capsule Take 1 capsule (200 mg total) by mouth daily. 90 capsule 1   cetirizine (ZYRTEC) 10 MG tablet Take 10 mg by mouth daily.     doxycycline (VIBRA-TABS) 100 MG tablet Take 1 tablet (100 mg total) by mouth 2 (two) times daily. 30 tablet 1   irbesartan-hydrochlorothiazide (AVALIDE) 150-12.5 MG tablet 1 tab po qd 90 tablet 1   Multiple Vitamin (MULTIVITAMIN) capsule Take 1 capsule by mouth daily.     olopatadine (PATANOL)  0.1 % ophthalmic solution Place 1 drop into both eyes daily as needed.     ondansetron (ZOFRAN) 4 MG tablet Take 1 tablet (4 mg total) by mouth every 8 (eight) hours as needed for nausea or vomiting. (Patient not taking: Reported on 08/20/2022) 20 tablet 0   phentermine (ADIPEX-P) 37.5 MG tablet 1 tab po qd 90 tablet 0   Probiotic Product (PROBIOTIC GUMMIES PO) Take by mouth in the morning and at bedtime.     vitamin E 100 UNIT capsule Take 100 Units by mouth daily.     fluticasone (FLONASE) 50 MCG/ACT nasal spray Place 2 sprays into both nostrils daily. (Patient not taking: Reported on 01/31/2021) 16 g 6   No facility-administered medications prior to visit.    Allergies  Allergen Reactions   Augmentin [Amoxicillin-Pot Clavulanate] Rash    Review of Systems  As per HPI  PE:    08/20/2022    3:45 PM 08/20/2022    3:34 PM 05/18/2022    3:35 PM  Vitals with BMI  Height  5\' 3"  5\' 3"   Weight  188 lbs 6 oz 187 lbs  BMI  33.38 33.13  Systolic 156 163 782  Diastolic 86 90 76  Pulse  64 93     Physical Exam  Gen: Alert, well appearing.  Patient is oriented to person, place, time, and situation. AFFECT: pleasant, lucid thought and speech.   LABS:  Last CBC Lab Results  Component Value Date   WBC 6.3 05/18/2022   HGB 13.2 05/18/2022   HCT 39.0 05/18/2022   MCV 87.2 05/18/2022   MCH 29.5 05/18/2022   RDW 12.2 05/18/2022   PLT 270 05/18/2022   Last metabolic panel Lab Results  Component Value Date   GLUCOSE 114 (H) 05/18/2022   NA 141 05/18/2022   K 3.4 (L) 05/18/2022   CL 103 05/18/2022   CO2 25 05/18/2022   BUN 23 05/18/2022   CREATININE 0.70 05/18/2022   CALCIUM 8.9 05/18/2022   PROT 6.7 05/18/2022   ALBUMIN 3.4 (L) 05/30/2020   BILITOT 0.5 05/18/2022   ALKPHOS 63 05/30/2020   AST 14 05/18/2022   ALT 14 05/18/2022   Last hemoglobin A1c Lab Results  Component Value Date   HGBA1C 5.6 05/18/2022   Last thyroid functions Lab Results  Component Value Date    TSH 1.72 05/18/2022   Lab Results  Component Value Date   ESRSEDRATE 51 (H) 07/20/2010   IMPRESSION AND PLAN:  #1 nocturia. Suspect she has some overactive bladder. Urinalysis normal today. Discussed limiting fluid after supper and she wants to take this approach  rather than start any medication at this time.  2.  Arthritis, multiple sites. Celebrex 200 mg a day is pretty helpful for her but nighttime symptoms are playing a role in her poor sleep.  I encouraged her to go ahead and take 500 to 1000 mg of Tylenol at night as long as this is helping her get better rest.  3.  Chronic fatigue/excessive daytime sleepiness. Suspect due to poor sleep from #1 above.  However, we may need to look into the possibility of sleep apnea.  #4 hypertension, elevated blood pressure here today. Would like her to get her blood pressure cuff out and start checking at home.  Goal blood pressure of 130/80 was discussed today. Continue irbesartan/HCTZ 100/12.5, one tab daily.  An After Visit Summary was printed and given to the patient.  FOLLOW UP: Return for 3-4 wks f/u urine/pain/fatigue.   Signed:  Santiago Bumpers, MD           08/20/2022

## 2022-09-27 DIAGNOSIS — M1712 Unilateral primary osteoarthritis, left knee: Secondary | ICD-10-CM | POA: Diagnosis not present

## 2022-10-08 ENCOUNTER — Encounter: Payer: Self-pay | Admitting: Family Medicine

## 2022-11-17 ENCOUNTER — Other Ambulatory Visit: Payer: Self-pay | Admitting: Family Medicine

## 2022-11-22 DIAGNOSIS — M1712 Unilateral primary osteoarthritis, left knee: Secondary | ICD-10-CM | POA: Diagnosis not present

## 2022-12-03 ENCOUNTER — Ambulatory Visit (INDEPENDENT_AMBULATORY_CARE_PROVIDER_SITE_OTHER): Payer: Medicare HMO | Admitting: Family Medicine

## 2022-12-03 ENCOUNTER — Encounter: Payer: Self-pay | Admitting: Family Medicine

## 2022-12-03 VITALS — BP 126/84 | HR 79 | Wt 190.4 lb

## 2022-12-03 DIAGNOSIS — E7800 Pure hypercholesterolemia, unspecified: Secondary | ICD-10-CM

## 2022-12-03 DIAGNOSIS — Z791 Long term (current) use of non-steroidal anti-inflammatories (NSAID): Secondary | ICD-10-CM

## 2022-12-03 DIAGNOSIS — Z7689 Persons encountering health services in other specified circumstances: Secondary | ICD-10-CM

## 2022-12-03 DIAGNOSIS — I10 Essential (primary) hypertension: Secondary | ICD-10-CM | POA: Diagnosis not present

## 2022-12-03 DIAGNOSIS — Z23 Encounter for immunization: Secondary | ICD-10-CM

## 2022-12-03 LAB — LIPID PANEL
Cholesterol: 250 mg/dL — ABNORMAL HIGH (ref 0–200)
HDL: 74.8 mg/dL (ref >39.00)
LDL Cholesterol: 158 mg/dL — ABNORMAL HIGH (ref 0–99)
NonHDL: 174.99
Total CHOL/HDL Ratio: 3
Triglycerides: 87 mg/dL (ref 0.0–149.0)
VLDL: 17.4 mg/dL (ref 0.0–40.0)

## 2022-12-03 LAB — COMPREHENSIVE METABOLIC PANEL
ALT: 21 U/L (ref 0–35)
AST: 17 U/L (ref 0–37)
Albumin: 3.3 g/dL — ABNORMAL LOW (ref 3.5–5.2)
Alkaline Phosphatase: 65 U/L (ref 39–117)
BUN: 18 mg/dL (ref 6–23)
CO2: 32 meq/L (ref 19–32)
Calcium: 9.2 mg/dL (ref 8.4–10.5)
Chloride: 103 meq/L (ref 96–112)
Creatinine, Ser: 0.65 mg/dL (ref 0.40–1.20)
GFR: 90.7 mL/min (ref >60.00)
Glucose, Bld: 92 mg/dL (ref 70–99)
Potassium: 4 meq/L (ref 3.5–5.1)
Sodium: 143 meq/L (ref 135–145)
Total Bilirubin: 0.7 mg/dL (ref 0.2–1.2)
Total Protein: 6.2 g/dL (ref 6.0–8.3)

## 2022-12-03 MED ORDER — IRBESARTAN-HYDROCHLOROTHIAZIDE 150-12.5 MG PO TABS: ORAL_TABLET | ORAL | 3 refills | Status: DC

## 2022-12-03 NOTE — Progress Notes (Signed)
OFFICE VISIT  12/03/2022  CC:  Chief Complaint  Patient presents with   Medical Management of Chronic Issues    Pt mentions visiting Orthopedic surgeon regarding her knee; wants to discuss if surgery is a necessary option.     Patient is a 67 y.o. female who presents for 65-month follow-up hypertension and weight management. A/P as of last visit: "1. hypertension, well-controlled on irbesartan/HCTZ 150/12.5 daily. Electrolytes and creatinine today.   2.  Obesity class I, weight management. Phentermine 37.5 daily long-term, doing well."  INTERIM HX: [place] well. Blood pressures at home normal.  Not limiting calories very much. Unable to exercise much due to chronic left knee pain.  Still with plan to get total knee replacement on the left but might wait till after the holidays. On a good note, she was put on meloxicam instead of her Celebrex about 1 week ago and says taking this daily has resulted in almost complete resolution of her knee pain.  She has follow-up with orthopedics in about a month. She does have a little bit of midepigastric pain intermittently since starting the meloxicam.  ROS as above, plus--> no fevers, no CP, no SOB, no wheezing, no cough, no dizziness, no HAs, no rashes, no melena/hematochezia.  No polyuria or polydipsia.  No myalgias..  No focal weakness, paresthesias, or tremors.  No acute vision or hearing abnormalities.  No dysuria or unusual/new urinary urgency or frequency.  No recent changes in lower legs. No n/v/d.   No palpitations.     Past Medical History:  Diagnosis Date   Asthma    Borderline hyperlipidemia 2017/2018   Her 10 yr Framingham risk = 5% in 2018.  LDL improved 02/2017 (on no meds).  Fram risk 03/2018 4.5%. Fram risk 05/2020 6.3%.   Breast mass, right 07/08/2014   bx benign   Colon cancer screening 03/2016   2018 Cologuard negative.  Repeat 05/2019 Neg.  05/2022 NEG   Concussion with no loss of consciousness 04/01/2008   Depression     Hypertension    Obesity    Osteoarthritis of knees, bilateral    mild/mod on plain films 09/2018.  L mod-to-severe 09/2021-->plan for TKA fall/winter 2024   Perennial allergic rhinitis    Polyarthralgia    Dr. Dareen Piano did rheum w/u which was neg approx 2012--all this got better with wt loss   Right foot pain    Hammertoe, bunion, and dorsal spur to be treated surgically by podiatrist as of spring 2019    Past Surgical History:  Procedure Laterality Date   CESAREAN SECTION     x 2   DEXA  07/19/15; 11/27/18   2017: 'penia (T-Score -1.4).  11/2018 T score -1.7.  Plan rpt 2 yrs (her GYN has been doing these)   left foot surgery     2nd toe dislocation repair + bunion repair ([facility])   RETINAL LASER PROCEDURE Right 03/12/2019   RETINAL LASER PROCEDURE Left 03/26/2019   ROTATOR CUFF REPAIR  Approx 2009   Right    Outpatient Medications Prior to Visit  Medication Sig Dispense Refill   beclomethasone (QVAR REDIHALER) 80 MCG/ACT inhaler TAKE 2 PUFFS BY MOUTH TWICE A DAY 10.6 g 11   Calcium Carbonate-Vitamin D 600-400 MG-UNIT chew tablet Chew 1 tablet by mouth daily.     cetirizine (ZYRTEC) 10 MG tablet Take 10 mg by mouth daily.     doxycycline (VIBRA-TABS) 100 MG tablet Take 1 tablet (100 mg total) by mouth 2 (two)  times daily. 30 tablet 1   meloxicam (MOBIC) 15 MG tablet Take 15 mg by mouth daily.     Multiple Vitamin (MULTIVITAMIN) capsule Take 1 capsule by mouth daily.     olopatadine (PATANOL) 0.1 % ophthalmic solution Place 1 drop into both eyes daily as needed.     ondansetron (ZOFRAN) 4 MG tablet Take 1 tablet (4 mg total) by mouth every 8 (eight) hours as needed for nausea or vomiting. (Patient not taking: Reported on 08/20/2022) 20 tablet 0   phentermine (ADIPEX-P) 37.5 MG tablet 1 tab po qd 90 tablet 0   Probiotic Product (PROBIOTIC GUMMIES PO) Take by mouth in the morning and at bedtime.     vitamin E 100 UNIT capsule Take 100 Units by mouth daily.     celecoxib  (CELEBREX) 200 MG capsule TAKE 1 CAPSULE BY MOUTH EVERY DAY 30 capsule 0   fluticasone (FLONASE) 50 MCG/ACT nasal spray Place 2 sprays into both nostrils daily. (Patient not taking: Reported on 01/31/2021) 16 g 6   irbesartan-hydrochlorothiazide (AVALIDE) 150-12.5 MG tablet 1 tab po qd 90 tablet 1   No facility-administered medications prior to visit.    Allergies  Allergen Reactions   Augmentin [Amoxicillin-Pot Clavulanate] Rash    Review of Systems As per HPI  PE:    12/03/2022    8:09 AM 08/20/2022    3:45 PM 08/20/2022    3:34 PM  Vitals with BMI  Height   5\' 3"   Weight 190 lbs 6 oz  188 lbs 6 oz  BMI   33.38  Systolic 126 156 161  Diastolic 84 86 90  Pulse 79  64     Physical Exam  Gen: Alert, well appearing.  Patient is oriented to person, place, time, and situation. AFFECT: pleasant, lucid thought and speech. CV: RRR, no m/r/g.   LUNGS: CTA bilat, nonlabored resps, good aeration in all lung fields. EXT: no clubbing or cyanosis.  no edema.    LABS:  Last CBC Lab Results  Component Value Date   WBC 6.3 05/18/2022   HGB 13.2 05/18/2022   HCT 39.0 05/18/2022   MCV 87.2 05/18/2022   MCH 29.5 05/18/2022   RDW 12.2 05/18/2022   PLT 270 05/18/2022   Last metabolic panel Lab Results  Component Value Date   GLUCOSE 114 (H) 05/18/2022   NA 141 05/18/2022   K 3.4 (L) 05/18/2022   CL 103 05/18/2022   CO2 25 05/18/2022   BUN 23 05/18/2022   CREATININE 0.70 05/18/2022   GFR 91.00 05/30/2020   CALCIUM 8.9 05/18/2022   PROT 6.7 05/18/2022   ALBUMIN 3.4 (L) 05/30/2020   BILITOT 0.5 05/18/2022   ALKPHOS 63 05/30/2020   AST 14 05/18/2022   ALT 14 05/18/2022   Last lipids Lab Results  Component Value Date   CHOL 248 (H) 05/18/2022   HDL 75 05/18/2022   LDLCALC 148 (H) 05/18/2022   LDLDIRECT 146.6 09/15/2008   TRIG 130 05/18/2022   CHOLHDL 3.3 05/18/2022   Last hemoglobin A1c Lab Results  Component Value Date   HGBA1C 5.6 05/18/2022   Last thyroid  functions Lab Results  Component Value Date   TSH 1.72 05/18/2022   IMPRESSION AND PLAN:  #1 hypertension, well-controlled on irbesartan/HCTZ 150/12.5, 1/day. Electrolytes and creatinine monitoring today.  2.  Hypercholesterolemia, mild.  No medications. Monitor lipid panel today.  3.  Chronic left knee pain, severe osteoarthritis. Requiring daily NSAID. Monitoring renal function closely. She will get knee replacement soon  but will likely still need NSAIDs intermittently. She is well aware of the potential acute and long-term side effects of these medications. Will take Celebrex off of her med list and she is going to continue meloxicam 15 mg a day as needed per Ortho recommendations.  #4 weight management, class I obesity. BMI is 33. Phentermine has helped her maintain or lose some weight in the past.  She stopped this medication about a month ago because of excessive dry mouth and trouble getting to sleep. We discussed possible Q Asimia in the future but we decided to hold off. Additionally, she is not interested in GLP-1 agonist at this time.  An After Visit Summary was printed and given to the patient.  FOLLOW UP: Return in about 3 months (around 03/05/2023) for routine chronic illness f/u. Next CPE 04/2023 Signed:  Santiago Bumpers, MD           12/03/2022

## 2022-12-19 ENCOUNTER — Other Ambulatory Visit: Payer: Self-pay | Admitting: Family Medicine

## 2022-12-20 ENCOUNTER — Telehealth: Payer: Self-pay | Admitting: Family Medicine

## 2022-12-20 DIAGNOSIS — E7800 Pure hypercholesterolemia, unspecified: Secondary | ICD-10-CM

## 2022-12-20 MED ORDER — ATORVASTATIN CALCIUM 20 MG PO TABS: 20.0000 mg | ORAL_TABLET | Freq: Every day | ORAL | 2 refills | Status: DC

## 2022-12-20 NOTE — Telephone Encounter (Signed)
Patient calls to report that she received a text message from her pharmacy stating her Celebrex would not be refilled. And also her pharmacy has no indication of the Cholesterol medication that should've been called in for her. Per patients request please give the patient a call.

## 2022-12-20 NOTE — Telephone Encounter (Signed)
Hi Laura Green, all of your labs are normal except cholesterol is elevated. I recommend you start cholesterol-lowering therapy in order to try to decrease your risk of developing cardiovascular disease or stroke.  I'll send in prescription for atorvastatin. Pls make lab appointment to Recheck cholesterol--lab visit only--in 2-3 months. ...   Pls eRx 20mg  tabs, 1 every day, #30, RF x 2.  Order future lipid panel and AST/ALT, dx hypercholesterolemia.-thx    Written by Jeoffrey Massed, MD on 12/05/2022  8:12 AM EDT

## 2022-12-20 NOTE — Telephone Encounter (Signed)
Called and spoke with pt. According to Dr. Samul Dada note at the time of the pt's last appt, she would be discontinuing Celebrex per her Orthopedic due to taking Meloxicam. The pt understood. She inquired about a cholesterol medication Dr. Milinda Cave had told her she was supposed to start taking but did not have one to pick up at the pharmacy. It was stated in a message sent to the pt by Dr. Milinda Cave for the pt to start Atorvastatin 20MG . I sent that medication in for her today.

## 2022-12-21 ENCOUNTER — Other Ambulatory Visit: Payer: Self-pay | Admitting: Family Medicine

## 2022-12-21 DIAGNOSIS — E7800 Pure hypercholesterolemia, unspecified: Secondary | ICD-10-CM

## 2022-12-27 DIAGNOSIS — Z791 Long term (current) use of non-steroidal anti-inflammatories (NSAID): Secondary | ICD-10-CM | POA: Diagnosis not present

## 2022-12-27 DIAGNOSIS — M1990 Unspecified osteoarthritis, unspecified site: Secondary | ICD-10-CM | POA: Diagnosis not present

## 2022-12-27 DIAGNOSIS — E785 Hyperlipidemia, unspecified: Secondary | ICD-10-CM | POA: Diagnosis not present

## 2022-12-27 DIAGNOSIS — Z809 Family history of malignant neoplasm, unspecified: Secondary | ICD-10-CM | POA: Diagnosis not present

## 2022-12-27 DIAGNOSIS — E669 Obesity, unspecified: Secondary | ICD-10-CM | POA: Diagnosis not present

## 2022-12-27 DIAGNOSIS — K219 Gastro-esophageal reflux disease without esophagitis: Secondary | ICD-10-CM | POA: Diagnosis not present

## 2022-12-27 DIAGNOSIS — Z8249 Family history of ischemic heart disease and other diseases of the circulatory system: Secondary | ICD-10-CM | POA: Diagnosis not present

## 2022-12-27 DIAGNOSIS — J45909 Unspecified asthma, uncomplicated: Secondary | ICD-10-CM | POA: Diagnosis not present

## 2022-12-27 DIAGNOSIS — Z9849 Cataract extraction status, unspecified eye: Secondary | ICD-10-CM | POA: Diagnosis not present

## 2022-12-27 DIAGNOSIS — Z6832 Body mass index (BMI) 32.0-32.9, adult: Secondary | ICD-10-CM | POA: Diagnosis not present

## 2022-12-27 DIAGNOSIS — I10 Essential (primary) hypertension: Secondary | ICD-10-CM | POA: Diagnosis not present

## 2022-12-27 DIAGNOSIS — Z823 Family history of stroke: Secondary | ICD-10-CM | POA: Diagnosis not present

## 2022-12-28 HISTORY — PX: OTHER SURGICAL HISTORY: SHX169

## 2022-12-31 DIAGNOSIS — M1712 Unilateral primary osteoarthritis, left knee: Secondary | ICD-10-CM | POA: Diagnosis not present

## 2023-01-03 ENCOUNTER — Encounter: Payer: Self-pay | Admitting: Family Medicine

## 2023-01-03 ENCOUNTER — Telehealth: Payer: Self-pay | Admitting: Family Medicine

## 2023-01-03 NOTE — Telephone Encounter (Signed)
[facility] Pre-op form faxed, to be filled out by provider. Patient requested to send it back via Fax within ASAP. Document is located in providers tray at front office.Please advise at Mobile [phone] (mobile)

## 2023-01-03 NOTE — Telephone Encounter (Signed)
Placed on PCP desk, pt had [facility] appt on 10/7 with labs

## 2023-01-03 NOTE — Telephone Encounter (Signed)
Signed and put in box to go up front. Signed:  Santiago Bumpers, MD           01/03/2023

## 2023-01-07 ENCOUNTER — Telehealth: Payer: Self-pay

## 2023-01-07 NOTE — Telephone Encounter (Signed)
Pt scheduled for 11/12

## 2023-01-07 NOTE — Telephone Encounter (Signed)
Patient thinks she may be having a reaction to atorvastatin. She started taking this medication on 12/22/22.  Since then, she has been very tired, after she eats, she gets cramps in stomach and she is very nauseous.  Please advise. [phone]

## 2023-01-07 NOTE — Telephone Encounter (Signed)
Please assist patient with scheduling to discuss with provider, thanks.

## 2023-01-08 ENCOUNTER — Telehealth (INDEPENDENT_AMBULATORY_CARE_PROVIDER_SITE_OTHER): Payer: Medicare HMO | Admitting: Family Medicine

## 2023-01-08 ENCOUNTER — Encounter: Payer: Self-pay | Admitting: Family Medicine

## 2023-01-08 VITALS — BP 133/78 | HR 82

## 2023-01-08 DIAGNOSIS — R109 Unspecified abdominal pain: Secondary | ICD-10-CM

## 2023-01-08 DIAGNOSIS — T50905A Adverse effect of unspecified drugs, medicaments and biological substances, initial encounter: Secondary | ICD-10-CM

## 2023-01-08 NOTE — Progress Notes (Signed)
Virtual Visit via Video Note  I connected with Laura Green  on 01/08/23 at 10:20 AM EST by a video enabled telemedicine application and verified that I am speaking with the correct person using two identifiers.  Location patient: [place] Location provider:work or home office Persons participating in the virtual visit: patient, provider  I discussed the limitations and requested verbal permission for telemedicine visit. The patient expressed understanding and agreed to proceed.  HPI: 67 year old female being seen today for concern of possible medication side effect. I saw her in 12/20/2022 and started her on atorvastatin. No adverse symptoms after starting it.  She has some chronic diffuse arthritic pain that was unchanged. 3 nights ago she was eating supper and developed lower abdominal cramps diffusely.  She was unable to finish her meal.  Happened again the next evening at supper. No fever.  Some mild constipation lately.  No diarrhea.  No melena or hematochezia. She does not feel ill. No recent sick contacts Her most recent dose of the atorvastatin was yesterday.  ROS: See pertinent positives and negatives per HPI.  Past Medical History:  Diagnosis Date   Asthma    Borderline hyperlipidemia 2017/2018   Her 10 yr Framingham risk = 5% in 2018.  LDL improved 02/2017 (on no meds).  Fram risk 03/2018 4.5%. Fram risk 05/2020 6.3%.   Colon cancer screening 03/2016   2018 Cologuard negative.  Repeat 05/2019 Neg.  05/2022 NEG   Concussion with no loss of consciousness 04/01/2008   Depression    Hypertension    Obesity    Osteoarthritis of knees, bilateral    mild/mod on plain films 09/2018.  L mod-to-severe 09/2021-->plan for TKA fall/winter 2024   Perennial allergic rhinitis    Polyarthralgia    Dr. Dareen Piano did rheum w/u which was neg approx 2012--all this got better with wt loss   Right foot pain    Hammertoe, bunion, and dorsal spur to be treated surgically by podiatrist as of spring 2019     Past Surgical History:  Procedure Laterality Date   BREAST BIOPSY     2016 benign   CESAREAN SECTION     x 2   DEXA  07/19/15; 11/27/18   2017: 'penia (T-Score -1.4).  11/2018 T score -1.7.  Plan rpt 2 yrs (her GYN has been doing these)   left foot surgery     2nd toe dislocation repair + bunion repair ([facility])   RETINAL LASER PROCEDURE Right 03/12/2019   RETINAL LASER PROCEDURE Left 03/26/2019   ROTATOR CUFF REPAIR  Approx 2009   Right     Current Outpatient Medications:    atorvastatin (LIPITOR) 20 MG tablet, Take 1 tablet (20 mg total) by mouth daily., Disp: 30 tablet, Rfl: 2   beclomethasone (QVAR REDIHALER) 80 MCG/ACT inhaler, TAKE 2 PUFFS BY MOUTH TWICE A DAY, Disp: 10.6 g, Rfl: 11   Calcium Carbonate-Vitamin D 600-400 MG-UNIT chew tablet, Chew 1 tablet by mouth daily., Disp: , Rfl:    cetirizine (ZYRTEC) 10 MG tablet, Take 10 mg by mouth daily., Disp: , Rfl:    doxycycline (VIBRA-TABS) 100 MG tablet, Take 1 tablet (100 mg total) by mouth 2 (two) times daily., Disp: 30 tablet, Rfl: 1   irbesartan-hydrochlorothiazide (AVALIDE) 150-12.5 MG tablet, 1 tab po qd, Disp: 90 tablet, Rfl: 3   meloxicam (MOBIC) 15 MG tablet, Take 15 mg by mouth daily., Disp: , Rfl:    Multiple Vitamin (MULTIVITAMIN) capsule, Take 1 capsule by mouth daily., Disp: ,  Rfl:    olopatadine (PATANOL) 0.1 % ophthalmic solution, Place 1 drop into both eyes daily as needed., Disp: , Rfl:    ondansetron (ZOFRAN) 4 MG tablet, Take 1 tablet (4 mg total) by mouth every 8 (eight) hours as needed for nausea or vomiting., Disp: 20 tablet, Rfl: 0   phentermine (ADIPEX-P) 37.5 MG tablet, 1 tab po qd, Disp: 90 tablet, Rfl: 0   Probiotic Product (PROBIOTIC GUMMIES PO), Take by mouth in the morning and at bedtime., Disp: , Rfl:    vitamin E 100 UNIT capsule, Take 100 Units by mouth daily., Disp: , Rfl:    fluticasone (FLONASE) 50 MCG/ACT nasal spray, Place 2 sprays into both nostrils daily. (Patient not taking:  Reported on 01/31/2021), Disp: 16 g, Rfl: 6  EXAM:  VITALS per patient if applicable:      01/08/2023   10:25 AM 01/08/2023   10:22 AM 12/03/2022    8:09 AM  Vitals with BMI  Weight   190 lbs 6 oz  Systolic 133 146 562  Diastolic 78 86 84  Pulse  82 79    GENERAL: alert, oriented, appears well and in no acute distress  HEENT: atraumatic, conjunttiva clear, no obvious abnormalities on inspection of external nose and ears  NECK: normal movements of the head and neck  LUNGS: on inspection no signs of respiratory distress, breathing rate appears normal, no obvious gross SOB, gasping or wheezing  CV: no obvious cyanosis  MS: moves all visible extremities without noticeable abnormality  PSYCH/NEURO: pleasant and cooperative, no obvious depression or anxiety, speech and thought processing grossly intact  LABS: none today    Chemistry      Component Value Date/Time   NA 143 12/03/2022 0835   K 4.0 12/03/2022 0835   CL 103 12/03/2022 0835   CO2 32 12/03/2022 0835   BUN 18 12/03/2022 0835   CREATININE 0.65 12/03/2022 0835   CREATININE 0.70 05/18/2022 1616      Component Value Date/Time   CALCIUM 9.2 12/03/2022 0835   ALKPHOS 65 12/03/2022 0835   AST 17 12/03/2022 0835   ALT 21 12/03/2022 0835   BILITOT 0.7 12/03/2022 0835     Lab Results  Component Value Date   CHOL 250 (H) 12/03/2022   HDL 74.80 12/03/2022   LDLCALC 158 (H) 12/03/2022   LDLDIRECT 146.6 09/15/2008   TRIG 87.0 12/03/2022   CHOLHDL 3 12/03/2022   ASSESSMENT AND PLAN:  Discussed the following assessment and plan:  #1 lower abdominal cramping. Unclear etiology, but intolerance to atorvastatin is certainly possible. It is odd, though, that she had no symptoms at all until she had been on the medication a couple of weeks, plus abdominal pain is not a typical side effect from statin. At any rate, her plan is to discontinue atorvastatin. Observe clinically.  I discussed the assessment and treatment  plan with the patient. The patient was provided an opportunity to ask questions and all were answered. The patient agreed with the plan and demonstrated an understanding of the instructions.   F/u: 1 week  Signed:  Santiago Bumpers, MD           01/08/2023

## 2023-01-15 NOTE — Progress Notes (Unsigned)
OFFICE VISIT  01/16/2023  CC:  Chief Complaint  Patient presents with   Abdominal Pain    F/U.  Pt states since she stopped taking the atorvastatin, pain/discomfort has stopped.      Patient is a 67 y.o. female who presents for 1 week f/u abdominal cramping. A/P as of last visit: "#1 lower abdominal cramping. Unclear etiology, but intolerance to atorvastatin is certainly possible. It is odd, though, that she had no symptoms at all until she had been on the medication a couple of weeks, plus abdominal pain is not a typical side effect from statin. At any rate, her plan is to discontinue atorvastatin. Observe clinically."  INTERIM HX: She has not had any further episodes of abdominal cramping since stopping atorvastatin. She feels well.   Past Medical History:  Diagnosis Date   Asthma    Borderline hyperlipidemia 2017/2018   Her 10 yr Framingham risk = 5% in 2018.  LDL improved 02/2017 (on no meds).  Fram risk 03/2018 4.5%. Fram risk 05/2020 6.3%.   Colon cancer screening 03/2016   2018 Cologuard negative.  Repeat 05/2019 Neg.  05/2022 NEG   Concussion with no loss of consciousness 04/01/2008   Depression    Hypertension    Obesity    Osteoarthritis of knees, bilateral    mild/mod on plain films 09/2018.  L mod-to-severe 09/2021-->plan for TKA fall/winter 2024   Perennial allergic rhinitis    Polyarthralgia    Dr. Dareen Piano did rheum w/u which was neg approx 2012--all this got better with wt loss   Right foot pain    Hammertoe, bunion, and dorsal spur to be treated surgically by podiatrist as of spring 2019    Past Surgical History:  Procedure Laterality Date   BREAST BIOPSY     2016 benign   CESAREAN SECTION     x 2   DEXA  07/19/15; 11/27/18   2017: 'penia (T-Score -1.4).  11/2018 T score -1.7.  Plan rpt 2 yrs (her GYN has been doing these)   left foot surgery     2nd toe dislocation repair + bunion repair ([facility])   RETINAL LASER PROCEDURE Right 03/12/2019    RETINAL LASER PROCEDURE Left 03/26/2019   ROTATOR CUFF REPAIR  Approx 2009   Right    Outpatient Medications Prior to Visit  Medication Sig Dispense Refill   beclomethasone (QVAR REDIHALER) 80 MCG/ACT inhaler TAKE 2 PUFFS BY MOUTH TWICE A DAY 10.6 g 11   Calcium Carbonate-Vitamin D 600-400 MG-UNIT chew tablet Chew 1 tablet by mouth daily.     cetirizine (ZYRTEC) 10 MG tablet Take 10 mg by mouth daily.     doxycycline (VIBRA-TABS) 100 MG tablet Take 1 tablet (100 mg total) by mouth 2 (two) times daily. 30 tablet 1   irbesartan-hydrochlorothiazide (AVALIDE) 150-12.5 MG tablet 1 tab po qd 90 tablet 3   meloxicam (MOBIC) 15 MG tablet Take 15 mg by mouth daily.     Multiple Vitamin (MULTIVITAMIN) capsule Take 1 capsule by mouth daily.     olopatadine (PATANOL) 0.1 % ophthalmic solution Place 1 drop into both eyes daily as needed.     ondansetron (ZOFRAN) 4 MG tablet Take 1 tablet (4 mg total) by mouth every 8 (eight) hours as needed for nausea or vomiting. 20 tablet 0   phentermine (ADIPEX-P) 37.5 MG tablet 1 tab po qd 90 tablet 0   Probiotic Product (PROBIOTIC GUMMIES PO) Take by mouth in the morning and at bedtime.  vitamin E 100 UNIT capsule Take 100 Units by mouth daily.     atorvastatin (LIPITOR) 20 MG tablet Take 1 tablet (20 mg total) by mouth daily. (Patient not taking: Reported on 01/16/2023) 30 tablet 2   fluticasone (FLONASE) 50 MCG/ACT nasal spray Place 2 sprays into both nostrils daily. (Patient not taking: Reported on 01/31/2021) 16 g 6   No facility-administered medications prior to visit.    Allergies  Allergen Reactions   Augmentin [Amoxicillin-Pot Clavulanate] Rash    Review of Systems As per HPI  PE:    01/16/2023   10:26 AM 01/08/2023   10:25 AM 01/08/2023   10:22 AM  Vitals with BMI  Weight 188 lbs    Systolic 121 133 347  Diastolic 80 78 86  Pulse 62  82     Physical Exam  Gen: Alert, well appearing.  Patient is oriented to person, place, time, and  situation. AFFECT: pleasant, lucid thought and speech.   LABS:  Last CBC Lab Results  Component Value Date   WBC 6.3 05/18/2022   HGB 13.2 05/18/2022   HCT 39.0 05/18/2022   MCV 87.2 05/18/2022   MCH 29.5 05/18/2022   RDW 12.2 05/18/2022   PLT 270 05/18/2022   Last metabolic panel Lab Results  Component Value Date   GLUCOSE 92 12/03/2022   NA 143 12/03/2022   K 4.0 12/03/2022   CL 103 12/03/2022   CO2 32 12/03/2022   BUN 18 12/03/2022   CREATININE 0.65 12/03/2022   GFR 90.70 12/03/2022   CALCIUM 9.2 12/03/2022   PROT 6.2 12/03/2022   ALBUMIN 3.3 (L) 12/03/2022   BILITOT 0.7 12/03/2022   ALKPHOS 65 12/03/2022   AST 17 12/03/2022   ALT 21 12/03/2022   Lab Results  Component Value Date   CHOL 250 (H) 12/03/2022   HDL 74.80 12/03/2022   LDLCALC 158 (H) 12/03/2022   LDLDIRECT 146.6 09/15/2008   TRIG 87.0 12/03/2022   CHOLHDL 3 12/03/2022   IMPRESSION AND PLAN:  #1 hypercholesterolemia, recent intolerance/abdominal cramping secondary to atorvastatin. Hesitant to start a different statin at this time. 10 yr fmhm cv risk= 9.0%-->intermediate risk. Will move forward with coronary calcium scoring to get additional data regarding risk.  An After Visit Summary was printed and given to the patient.  FOLLOW UP: Return for As needed.  Signed:  Santiago Bumpers, MD           01/16/2023

## 2023-01-16 ENCOUNTER — Ambulatory Visit: Payer: Medicare HMO | Admitting: Family Medicine

## 2023-01-16 ENCOUNTER — Encounter: Payer: Self-pay | Admitting: Family Medicine

## 2023-01-16 VITALS — BP 121/80 | HR 62 | Wt 188.0 lb

## 2023-01-16 DIAGNOSIS — R072 Precordial pain: Secondary | ICD-10-CM

## 2023-01-16 DIAGNOSIS — T887XXA Unspecified adverse effect of drug or medicament, initial encounter: Secondary | ICD-10-CM | POA: Diagnosis not present

## 2023-01-16 DIAGNOSIS — Z9189 Other specified personal risk factors, not elsewhere classified: Secondary | ICD-10-CM

## 2023-01-16 DIAGNOSIS — E7800 Pure hypercholesterolemia, unspecified: Secondary | ICD-10-CM

## 2023-01-16 MED ORDER — ALBUTEROL SULFATE HFA 108 (90 BASE) MCG/ACT IN AERS: 2.0000 | INHALATION_SPRAY | Freq: Four times a day (QID) | RESPIRATORY_TRACT | 0 refills | Status: DC | PRN

## 2023-01-16 NOTE — Addendum Note (Signed)
Addended by: Jeoffrey Massed on: 01/16/2023 10:52 AM   Modules accepted: Orders

## 2023-01-16 NOTE — Addendum Note (Signed)
Addended by: Jeoffrey Massed on: 01/16/2023 11:04 AM   Modules accepted: Orders

## 2023-01-16 NOTE — Addendum Note (Signed)
Addended by: Jeoffrey Massed on: 01/16/2023 06:23 PM   Modules accepted: Orders

## 2023-01-23 ENCOUNTER — Ambulatory Visit (HOSPITAL_COMMUNITY)
Admission: RE | Admit: 2023-01-23 | Discharge: 2023-01-23 | Disposition: A | Discharge status: Home / Self Care | Payer: Medicare HMO | Source: Ambulatory Visit | Attending: Family Medicine | Admitting: Family Medicine

## 2023-01-23 ENCOUNTER — Encounter: Payer: Self-pay | Admitting: Family Medicine

## 2023-01-23 DIAGNOSIS — E7800 Pure hypercholesterolemia, unspecified: Secondary | ICD-10-CM | POA: Insufficient documentation

## 2023-01-23 DIAGNOSIS — Z9189 Other specified personal risk factors, not elsewhere classified: Secondary | ICD-10-CM | POA: Insufficient documentation

## 2023-01-28 DIAGNOSIS — M1712 Unilateral primary osteoarthritis, left knee: Secondary | ICD-10-CM | POA: Diagnosis not present

## 2023-02-10 ENCOUNTER — Other Ambulatory Visit: Payer: Self-pay | Admitting: Family Medicine

## 2023-02-27 DIAGNOSIS — M353 Polymyalgia rheumatica: Secondary | ICD-10-CM

## 2023-02-27 HISTORY — DX: Polymyalgia rheumatica: M35.3

## 2023-03-05 ENCOUNTER — Ambulatory Visit: Payer: Self-pay | Admitting: Family Medicine

## 2023-03-05 NOTE — Progress Notes (Deleted)
 OFFICE VISIT  03/05/2023  CC: No chief complaint on file.   Patient is a 68 y.o. female who presents for 59-month follow-up hypertension, hyperlipidemia, weight management, and chronic left knee pain requiring daily NSAID.  INTERIM HX: ***  Since last visit she had some intolerance to atorvastatin  so we stopped it. Coronary calcium  studies 01/23/2023 showed a score of 19, 58th percentile for age, race, and sex matched controls. We chose to hold off on trying another statin at this time. Plan is to recheck lipid panel periodically and try different statin if it is going up.    PMP AWARE reviewed today: most recent rx for phentermine  was filled 07/12/2022, # 90, rx by me. No red flags.    Past Medical History:  Diagnosis Date   Asthma    Borderline hyperlipidemia 2017/2018   Her 10 yr Framingham risk = 5% in 2018.  LDL improved 02/2017 (on no meds).  Fram risk 03/2018 4.5%. Fram risk 05/2020 6.3%.   Colon cancer screening 03/2016   2018 Cologuard negative.  Repeat 05/2019 Neg.  05/2022 NEG   Concussion with no loss of consciousness 04/01/2008   Depression    Hypertension    Obesity    Osteoarthritis of knees, bilateral    mild/mod on plain films 09/2018.  L mod-to-severe 09/2021-->plan for TKA fall/winter 2024   Perennial allergic rhinitis    Polyarthralgia    Dr. Lenon did rheum w/u which was neg approx 2012--all this got better with wt loss   Right foot pain    Hammertoe, bunion, and dorsal spur to be treated surgically by podiatrist as of spring 2019    Past Surgical History:  Procedure Laterality Date   BREAST BIOPSY     2016 benign   CESAREAN SECTION     x 2   Coronary calcium  score  12/2022   19 (58th%'tile)   DEXA  07/19/15; 11/27/18   2017: 'penia (T-Score -1.4).  11/2018 T score -1.7.  Plan rpt 2 yrs (her GYN has been doing these)   left foot surgery     2nd toe dislocation repair + bunion repair ([facility])   RETINAL LASER PROCEDURE Right 03/12/2019    RETINAL LASER PROCEDURE Left 03/26/2019   ROTATOR CUFF REPAIR  Approx 2009   Right    Outpatient Medications Prior to Visit  Medication Sig Dispense Refill   albuterol  (VENTOLIN  HFA) 108 (90 Base) MCG/ACT inhaler TAKE 2 PUFFS BY MOUTH EVERY 6 HOURS AS NEEDED FOR WHEEZE OR SHORTNESS OF BREATH 18 each 1   beclomethasone (QVAR  REDIHALER) 80 MCG/ACT inhaler TAKE 2 PUFFS BY MOUTH TWICE A DAY 10.6 g 11   Calcium  Carbonate-Vitamin D 600-400 MG-UNIT chew tablet Chew 1 tablet by mouth daily.     cetirizine (ZYRTEC) 10 MG tablet Take 10 mg by mouth daily.     doxycycline  (VIBRA -TABS) 100 MG tablet Take 1 tablet (100 mg total) by mouth 2 (two) times daily. 30 tablet 1   fluticasone  (FLONASE ) 50 MCG/ACT nasal spray Place 2 sprays into both nostrils daily. (Patient not taking: Reported on 01/31/2021) 16 g 6   irbesartan -hydrochlorothiazide  (AVALIDE) 150-12.5 MG tablet 1 tab po qd 90 tablet 3   meloxicam (MOBIC) 15 MG tablet Take 15 mg by mouth daily.     Multiple Vitamin (MULTIVITAMIN) capsule Take 1 capsule by mouth daily.     olopatadine (PATANOL) 0.1 % ophthalmic solution Place 1 drop into both eyes daily as needed.     ondansetron  (ZOFRAN ) 4  MG tablet Take 1 tablet (4 mg total) by mouth every 8 (eight) hours as needed for nausea or vomiting. 20 tablet 0   phentermine  (ADIPEX-P ) 37.5 MG tablet 1 tab po qd 90 tablet 0   Probiotic Product (PROBIOTIC GUMMIES PO) Take by mouth in the morning and at bedtime.     vitamin E 100 UNIT capsule Take 100 Units by mouth daily.     No facility-administered medications prior to visit.    Allergies  Allergen Reactions   Atorvastatin  Calcium  [Atorvastatin ] Other (See Comments)    Abdominal cramps   Augmentin  [Amoxicillin -Pot Clavulanate] Rash    Review of Systems As per HPI  PE:    01/16/2023   10:26 AM 01/08/2023   10:25 AM 01/08/2023   10:22 AM  Vitals with BMI  Weight 188 lbs    Systolic 121 133 853  Diastolic 80 78 86  Pulse 62  82      Physical Exam  ***  LABS:  Last lipids Lab Results  Component Value Date   CHOL 250 (H) 12/03/2022   HDL 74.80 12/03/2022   LDLCALC 158 (H) 12/03/2022   LDLDIRECT 146.6 09/15/2008   TRIG 87.0 12/03/2022   CHOLHDL 3 12/03/2022     Chemistry      Component Value Date/Time   NA 143 12/03/2022 0835   K 4.0 12/03/2022 0835   CL 103 12/03/2022 0835   CO2 32 12/03/2022 0835   BUN 18 12/03/2022 0835   CREATININE 0.65 12/03/2022 0835   CREATININE 0.70 05/18/2022 1616      Component Value Date/Time   CALCIUM  9.2 12/03/2022 0835   ALKPHOS 65 12/03/2022 0835   AST 17 12/03/2022 0835   ALT 21 12/03/2022 0835   BILITOT 0.7 12/03/2022 0835     Lab Results  Component Value Date   HGBA1C 5.6 05/18/2022   IMPRESSION AND PLAN:  No problem-specific Assessment & Plan notes found for this encounter.  No labs today An After Visit Summary was printed and given to the patient.  FOLLOW UP: No follow-ups on file. Next CPE after 04/2023 Signed:  Gerlene Hockey, MD           03/05/2023

## 2023-03-11 ENCOUNTER — Ambulatory Visit: Payer: Medicare HMO | Admitting: Family Medicine

## 2023-03-12 DIAGNOSIS — M1712 Unilateral primary osteoarthritis, left knee: Secondary | ICD-10-CM | POA: Diagnosis not present

## 2023-03-15 ENCOUNTER — Telehealth: Payer: Self-pay

## 2023-03-15 NOTE — Telephone Encounter (Signed)
Mychart message sent.

## 2023-03-18 DIAGNOSIS — R262 Difficulty in walking, not elsewhere classified: Secondary | ICD-10-CM | POA: Diagnosis not present

## 2023-03-18 DIAGNOSIS — M1732 Unilateral post-traumatic osteoarthritis, left knee: Secondary | ICD-10-CM | POA: Diagnosis not present

## 2023-03-18 DIAGNOSIS — M25662 Stiffness of left knee, not elsewhere classified: Secondary | ICD-10-CM | POA: Diagnosis not present

## 2023-03-25 NOTE — Telephone Encounter (Signed)
Okay, form completed and placed in box.

## 2023-03-25 NOTE — Telephone Encounter (Signed)
Copied and pasted from CRM  Reason for CRM: Patient is calling to request an update on the knee surgery clearance form - patient would like to know if it has been completed so that she can pick it up.   Please call patient [phone]

## 2023-03-25 NOTE — Telephone Encounter (Signed)
No further action needed.

## 2023-03-27 DIAGNOSIS — M25662 Stiffness of left knee, not elsewhere classified: Secondary | ICD-10-CM | POA: Diagnosis not present

## 2023-03-27 DIAGNOSIS — R262 Difficulty in walking, not elsewhere classified: Secondary | ICD-10-CM | POA: Diagnosis not present

## 2023-03-27 DIAGNOSIS — M1732 Unilateral post-traumatic osteoarthritis, left knee: Secondary | ICD-10-CM | POA: Diagnosis not present

## 2023-04-03 DIAGNOSIS — M25662 Stiffness of left knee, not elsewhere classified: Secondary | ICD-10-CM | POA: Diagnosis not present

## 2023-04-03 DIAGNOSIS — M1732 Unilateral post-traumatic osteoarthritis, left knee: Secondary | ICD-10-CM | POA: Diagnosis not present

## 2023-04-03 DIAGNOSIS — R262 Difficulty in walking, not elsewhere classified: Secondary | ICD-10-CM | POA: Diagnosis not present

## 2023-04-22 DIAGNOSIS — D225 Melanocytic nevi of trunk: Secondary | ICD-10-CM | POA: Diagnosis not present

## 2023-04-22 DIAGNOSIS — L538 Other specified erythematous conditions: Secondary | ICD-10-CM | POA: Diagnosis not present

## 2023-04-22 DIAGNOSIS — L814 Other melanin hyperpigmentation: Secondary | ICD-10-CM | POA: Diagnosis not present

## 2023-04-22 DIAGNOSIS — L821 Other seborrheic keratosis: Secondary | ICD-10-CM | POA: Diagnosis not present

## 2023-04-22 DIAGNOSIS — L820 Inflamed seborrheic keratosis: Secondary | ICD-10-CM | POA: Diagnosis not present

## 2023-04-23 DIAGNOSIS — M25562 Pain in left knee: Secondary | ICD-10-CM | POA: Diagnosis not present

## 2023-04-23 DIAGNOSIS — M1712 Unilateral primary osteoarthritis, left knee: Secondary | ICD-10-CM | POA: Diagnosis not present

## 2023-04-23 NOTE — Patient Instructions (Signed)
   It was very nice to see you today!   PLEASE NOTE:

## 2023-04-24 ENCOUNTER — Ambulatory Visit: Payer: Medicare HMO | Admitting: Family Medicine

## 2023-04-24 ENCOUNTER — Encounter: Payer: Self-pay | Admitting: Family Medicine

## 2023-04-24 VITALS — BP 144/82 | HR 62 | Temp 97.7°F | Wt 186.4 lb

## 2023-04-24 DIAGNOSIS — M7918 Myalgia, other site: Secondary | ICD-10-CM

## 2023-04-24 DIAGNOSIS — G8929 Other chronic pain: Secondary | ICD-10-CM | POA: Diagnosis not present

## 2023-04-24 DIAGNOSIS — M791 Myalgia, unspecified site: Secondary | ICD-10-CM | POA: Diagnosis not present

## 2023-04-24 NOTE — Progress Notes (Signed)
 OFFICE VISIT  04/24/2023  CC:  Chief Complaint  Patient presents with   Joint Pain    State celebrex is not working    Patient is a 68 y.o. female who presents for pain concerns.  INTERIM HX: She has had gradual recent worsening of upper back and shoulders pain diffusely over the last few weeks.  Hurts a little bit in the lateral aspects of both hips as well. She has chronically altered gait due to severe left knee pain from severe osteoarthritis.  She has plans to get total knee arthroplasty on 05/08/2023. She got left knee joint aspiration and injection about 1 month ago.  She says it helped for about 3 days. She takes Celebrex regularly and feels like it does not help at all.  No other joints or swelling.  She has no fever or rash.  No recent injury or overuse. No headaches, no oral ulcers, no swelling of the lower legs. Her sleep is impaired but this is primarily due to nocturia.  PMP AWARE reviewed today: most recent rx for phentermine was filled 07/12/2022, # 90, rx by me. No red flags.   Past Medical History:  Diagnosis Date   Asthma    Borderline hyperlipidemia 2017/2018   Her 10 yr Framingham risk = 5% in 2018.  LDL improved 02/2017 (on no meds).  Fram risk 03/2018 4.5%. Fram risk 05/2020 6.3%.   Colon cancer screening 03/2016   2018 Cologuard negative.  Repeat 05/2019 Neg.  05/2022 NEG   Concussion with no loss of consciousness 04/01/2008   Depression    Hypertension    Obesity    Osteoarthritis of knees, bilateral    mild/mod on plain films 09/2018.  L mod-to-severe 09/2021-->plan for TKA fall/winter 2024   Perennial allergic rhinitis    Polyarthralgia    Dr. Dareen Piano did rheum w/u which was neg approx 2012--all this got better with wt loss   Right foot pain    Hammertoe, bunion, and dorsal spur to be treated surgically by podiatrist as of spring 2019    Past Surgical History:  Procedure Laterality Date   BREAST BIOPSY     2016 benign   CESAREAN SECTION     x 2    Coronary calcium score  12/2022   19 (58th%'tile)   DEXA  07/19/15; 11/27/18   2017: 'penia (T-Score -1.4).  11/2018 T score -1.7.  Plan rpt 2 yrs (her GYN has been doing these)   left foot surgery     2nd toe dislocation repair + bunion repair ([facility])   RETINAL LASER PROCEDURE Right 03/12/2019   RETINAL LASER PROCEDURE Left 03/26/2019   ROTATOR CUFF REPAIR  Approx 2009   Right    Outpatient Medications Prior to Visit  Medication Sig Dispense Refill   albuterol (VENTOLIN HFA) 108 (90 Base) MCG/ACT inhaler TAKE 2 PUFFS BY MOUTH EVERY 6 HOURS AS NEEDED FOR WHEEZE OR SHORTNESS OF BREATH 18 each 1   beclomethasone (QVAR REDIHALER) 80 MCG/ACT inhaler TAKE 2 PUFFS BY MOUTH TWICE A DAY 10.6 g 11   Calcium Carbonate-Vitamin D 600-400 MG-UNIT chew tablet Chew 1 tablet by mouth daily.     cetirizine (ZYRTEC) 10 MG tablet Take 10 mg by mouth daily.     doxycycline (VIBRA-TABS) 100 MG tablet Take 1 tablet (100 mg total) by mouth 2 (two) times daily. 30 tablet 1   irbesartan-hydrochlorothiazide (AVALIDE) 150-12.5 MG tablet 1 tab po qd 90 tablet 3   meloxicam (MOBIC) 15 MG  tablet Take 15 mg by mouth daily.     Multiple Vitamin (MULTIVITAMIN) capsule Take 1 capsule by mouth daily.     olopatadine (PATANOL) 0.1 % ophthalmic solution Place 1 drop into both eyes daily as needed.     ondansetron (ZOFRAN) 4 MG tablet Take 1 tablet (4 mg total) by mouth every 8 (eight) hours as needed for nausea or vomiting. 20 tablet 0   phentermine (ADIPEX-P) 37.5 MG tablet 1 tab po qd 90 tablet 0   Probiotic Product (PROBIOTIC GUMMIES PO) Take by mouth in the morning and at bedtime.     vitamin E 100 UNIT capsule Take 100 Units by mouth daily.     fluticasone (FLONASE) 50 MCG/ACT nasal spray Place 2 sprays into both nostrils daily. (Patient not taking: Reported on 01/31/2021) 16 g 6   No facility-administered medications prior to visit.    Allergies  Allergen Reactions   Atorvastatin Calcium [Atorvastatin]  Other (See Comments)    Abdominal cramps   Augmentin [Amoxicillin-Pot Clavulanate] Rash    Review of Systems As per HPI  PE:    04/24/2023    3:28 PM 01/16/2023   10:26 AM 01/08/2023   10:25 AM  Vitals with BMI  Weight 186 lbs 6 oz 188 lbs   Systolic 144 121 161  Diastolic 82 80 78  Pulse 62 62      Physical Exam  Gen: Alert, well appearing.  Patient is oriented to person, place, time, and situation. AFFECT: pleasant, lucid thought and speech. Eyes without injection. No oral ulcers. She has mild diffuse tenderness to palpation over the trapezius and scapular areas bilaterally. No tenderness over the arms, low back, thighs, or lower legs. Left knee with effusion without erythema.  Otherwise joints are without any swelling.  LABS:  Last CBC Lab Results  Component Value Date   WBC 6.3 05/18/2022   HGB 13.2 05/18/2022   HCT 39.0 05/18/2022   MCV 87.2 05/18/2022   MCH 29.5 05/18/2022   RDW 12.2 05/18/2022   PLT 270 05/18/2022   Last metabolic panel Lab Results  Component Value Date   GLUCOSE 92 12/03/2022   NA 143 12/03/2022   K 4.0 12/03/2022   CL 103 12/03/2022   CO2 32 12/03/2022   BUN 18 12/03/2022   CREATININE 0.65 12/03/2022   GFR 90.70 12/03/2022   CALCIUM 9.2 12/03/2022   PROT 6.2 12/03/2022   ALBUMIN 3.3 (L) 12/03/2022   BILITOT 0.7 12/03/2022   ALKPHOS 65 12/03/2022   AST 17 12/03/2022   ALT 21 12/03/2022   Lab Results  Component Value Date   ESRSEDRATE 51 (H) 07/20/2010   Last lipids Lab Results  Component Value Date   CHOL 250 (H) 12/03/2022   HDL 74.80 12/03/2022   LDLCALC 158 (H) 12/03/2022   LDLDIRECT 146.6 09/15/2008   TRIG 87.0 12/03/2022   CHOLHDL 3 12/03/2022   Last hemoglobin A1c Lab Results  Component Value Date   HGBA1C 5.6 05/18/2022   Last thyroid functions Lab Results  Component Value Date   TSH 1.72 05/18/2022   IMPRESSION AND PLAN:  #1 myofascial pain. She has had episodes similar to this on and off in the  past.  Suspect fibromyalgia syndrome. Remote history of rheumatologic evaluation was unrevealing. Check sed rate and CRP today.  NSAIDs are not helping so she will go ahead and stop this. She has been prescribed some medication from her orthopedist in preparation for her upcoming knee surgery.  1 of these is  oxycodone.  She will go ahead and pick this up and try one half or 1 tab at bedtime to see if she can get some relief from her pain and better sleep. Therapeutic expectations and side effect profile of medication discussed today.  Patient's questions answered.  An After Visit Summary was printed and given to the patient.  FOLLOW UP: Return if symptoms worsen or fail to improve.  Signed:  Santiago Bumpers, MD           04/24/2023

## 2023-04-25 LAB — C-REACTIVE PROTEIN: CRP: 6 mg/L (ref <8.0)

## 2023-04-25 LAB — SEDIMENTATION RATE: Sed Rate: 60 mm/h — ABNORMAL HIGH (ref 0–30)

## 2023-04-26 ENCOUNTER — Telehealth: Payer: Self-pay

## 2023-04-26 ENCOUNTER — Other Ambulatory Visit: Payer: Self-pay | Admitting: Family Medicine

## 2023-04-26 ENCOUNTER — Telehealth: Payer: Self-pay | Admitting: Family Medicine

## 2023-04-26 MED ORDER — PREDNISONE 20 MG PO TABS: ORAL_TABLET | ORAL | 0 refills | Status: DC

## 2023-04-26 NOTE — Telephone Encounter (Signed)
 Copied from CRM [id number]. Topic: General - Other >> Apr 26, 2023 12:11 PM Fredrich Romans wrote: Reason for CRM: Patient returned call to Dr Milinda Cave regarding lab results.Patient would like a return call.

## 2023-04-26 NOTE — Telephone Encounter (Signed)
 Pt called and stated that she just spoke with you regarding her lab results. She explained that she has urgent questions for you and asked If you could call her back again. Please advise

## 2023-05-08 DIAGNOSIS — M1712 Unilateral primary osteoarthritis, left knee: Secondary | ICD-10-CM | POA: Diagnosis not present

## 2023-05-08 DIAGNOSIS — Z96652 Presence of left artificial knee joint: Secondary | ICD-10-CM | POA: Diagnosis not present

## 2023-05-08 DIAGNOSIS — M25762 Osteophyte, left knee: Secondary | ICD-10-CM | POA: Diagnosis not present

## 2023-05-08 DIAGNOSIS — G8918 Other acute postprocedural pain: Secondary | ICD-10-CM | POA: Diagnosis not present

## 2023-05-20 ENCOUNTER — Encounter: Payer: Medicare HMO | Admitting: Family Medicine

## 2023-05-20 DIAGNOSIS — M25662 Stiffness of left knee, not elsewhere classified: Secondary | ICD-10-CM | POA: Diagnosis not present

## 2023-05-20 DIAGNOSIS — M6281 Muscle weakness (generalized): Secondary | ICD-10-CM | POA: Diagnosis not present

## 2023-05-20 DIAGNOSIS — Z96652 Presence of left artificial knee joint: Secondary | ICD-10-CM | POA: Diagnosis not present

## 2023-05-21 DIAGNOSIS — M25462 Effusion, left knee: Secondary | ICD-10-CM | POA: Diagnosis not present

## 2023-05-22 DIAGNOSIS — Z96652 Presence of left artificial knee joint: Secondary | ICD-10-CM | POA: Diagnosis not present

## 2023-05-22 DIAGNOSIS — M25662 Stiffness of left knee, not elsewhere classified: Secondary | ICD-10-CM | POA: Diagnosis not present

## 2023-05-22 DIAGNOSIS — M6281 Muscle weakness (generalized): Secondary | ICD-10-CM | POA: Diagnosis not present

## 2023-05-27 DIAGNOSIS — M6281 Muscle weakness (generalized): Secondary | ICD-10-CM | POA: Diagnosis not present

## 2023-05-27 DIAGNOSIS — M25662 Stiffness of left knee, not elsewhere classified: Secondary | ICD-10-CM | POA: Diagnosis not present

## 2023-05-27 DIAGNOSIS — Z96652 Presence of left artificial knee joint: Secondary | ICD-10-CM | POA: Diagnosis not present

## 2023-05-29 DIAGNOSIS — M6281 Muscle weakness (generalized): Secondary | ICD-10-CM | POA: Diagnosis not present

## 2023-05-29 DIAGNOSIS — M25662 Stiffness of left knee, not elsewhere classified: Secondary | ICD-10-CM | POA: Diagnosis not present

## 2023-05-29 DIAGNOSIS — Z96652 Presence of left artificial knee joint: Secondary | ICD-10-CM | POA: Diagnosis not present

## 2023-05-31 DIAGNOSIS — M25662 Stiffness of left knee, not elsewhere classified: Secondary | ICD-10-CM | POA: Diagnosis not present

## 2023-05-31 DIAGNOSIS — M6281 Muscle weakness (generalized): Secondary | ICD-10-CM | POA: Diagnosis not present

## 2023-05-31 DIAGNOSIS — Z96652 Presence of left artificial knee joint: Secondary | ICD-10-CM | POA: Diagnosis not present

## 2023-06-03 DIAGNOSIS — Z96652 Presence of left artificial knee joint: Secondary | ICD-10-CM | POA: Diagnosis not present

## 2023-06-03 DIAGNOSIS — M6281 Muscle weakness (generalized): Secondary | ICD-10-CM | POA: Diagnosis not present

## 2023-06-03 DIAGNOSIS — M25662 Stiffness of left knee, not elsewhere classified: Secondary | ICD-10-CM | POA: Diagnosis not present

## 2023-06-05 DIAGNOSIS — M25662 Stiffness of left knee, not elsewhere classified: Secondary | ICD-10-CM | POA: Diagnosis not present

## 2023-06-05 DIAGNOSIS — Z96652 Presence of left artificial knee joint: Secondary | ICD-10-CM | POA: Diagnosis not present

## 2023-06-05 DIAGNOSIS — M6281 Muscle weakness (generalized): Secondary | ICD-10-CM | POA: Diagnosis not present

## 2023-06-07 DIAGNOSIS — Z96652 Presence of left artificial knee joint: Secondary | ICD-10-CM | POA: Diagnosis not present

## 2023-06-07 DIAGNOSIS — M25662 Stiffness of left knee, not elsewhere classified: Secondary | ICD-10-CM | POA: Diagnosis not present

## 2023-06-07 DIAGNOSIS — M6281 Muscle weakness (generalized): Secondary | ICD-10-CM | POA: Diagnosis not present

## 2023-06-11 DIAGNOSIS — M6281 Muscle weakness (generalized): Secondary | ICD-10-CM | POA: Diagnosis not present

## 2023-06-11 DIAGNOSIS — Z96652 Presence of left artificial knee joint: Secondary | ICD-10-CM | POA: Diagnosis not present

## 2023-06-11 DIAGNOSIS — M25662 Stiffness of left knee, not elsewhere classified: Secondary | ICD-10-CM | POA: Diagnosis not present

## 2023-06-12 DIAGNOSIS — M6281 Muscle weakness (generalized): Secondary | ICD-10-CM | POA: Diagnosis not present

## 2023-06-12 DIAGNOSIS — Z96652 Presence of left artificial knee joint: Secondary | ICD-10-CM | POA: Diagnosis not present

## 2023-06-12 DIAGNOSIS — M25662 Stiffness of left knee, not elsewhere classified: Secondary | ICD-10-CM | POA: Diagnosis not present

## 2023-06-18 DIAGNOSIS — M25662 Stiffness of left knee, not elsewhere classified: Secondary | ICD-10-CM | POA: Diagnosis not present

## 2023-06-18 DIAGNOSIS — M6281 Muscle weakness (generalized): Secondary | ICD-10-CM | POA: Diagnosis not present

## 2023-06-18 DIAGNOSIS — Z96652 Presence of left artificial knee joint: Secondary | ICD-10-CM | POA: Diagnosis not present

## 2023-06-19 DIAGNOSIS — M25662 Stiffness of left knee, not elsewhere classified: Secondary | ICD-10-CM | POA: Diagnosis not present

## 2023-06-19 DIAGNOSIS — M6281 Muscle weakness (generalized): Secondary | ICD-10-CM | POA: Diagnosis not present

## 2023-06-19 DIAGNOSIS — Z96652 Presence of left artificial knee joint: Secondary | ICD-10-CM | POA: Diagnosis not present

## 2023-06-21 DIAGNOSIS — M6281 Muscle weakness (generalized): Secondary | ICD-10-CM | POA: Diagnosis not present

## 2023-06-21 DIAGNOSIS — Z96652 Presence of left artificial knee joint: Secondary | ICD-10-CM | POA: Diagnosis not present

## 2023-06-21 DIAGNOSIS — M25662 Stiffness of left knee, not elsewhere classified: Secondary | ICD-10-CM | POA: Diagnosis not present

## 2023-06-24 DIAGNOSIS — M6281 Muscle weakness (generalized): Secondary | ICD-10-CM | POA: Diagnosis not present

## 2023-06-24 DIAGNOSIS — M25662 Stiffness of left knee, not elsewhere classified: Secondary | ICD-10-CM | POA: Diagnosis not present

## 2023-06-24 DIAGNOSIS — Z96652 Presence of left artificial knee joint: Secondary | ICD-10-CM | POA: Diagnosis not present

## 2023-06-26 DIAGNOSIS — M25662 Stiffness of left knee, not elsewhere classified: Secondary | ICD-10-CM | POA: Diagnosis not present

## 2023-06-26 DIAGNOSIS — M6281 Muscle weakness (generalized): Secondary | ICD-10-CM | POA: Diagnosis not present

## 2023-06-26 DIAGNOSIS — Z96652 Presence of left artificial knee joint: Secondary | ICD-10-CM | POA: Diagnosis not present

## 2023-06-27 ENCOUNTER — Encounter: Payer: Self-pay | Admitting: Family Medicine

## 2023-06-27 ENCOUNTER — Ambulatory Visit (INDEPENDENT_AMBULATORY_CARE_PROVIDER_SITE_OTHER): Payer: Medicare HMO | Admitting: Family Medicine

## 2023-06-27 VITALS — BP 128/80 | HR 79 | Temp 98.5°F | Ht 63.5 in | Wt 181.2 lb

## 2023-06-27 DIAGNOSIS — E7800 Pure hypercholesterolemia, unspecified: Secondary | ICD-10-CM

## 2023-06-27 DIAGNOSIS — M7918 Myalgia, other site: Secondary | ICD-10-CM

## 2023-06-27 DIAGNOSIS — R700 Elevated erythrocyte sedimentation rate: Secondary | ICD-10-CM

## 2023-06-27 DIAGNOSIS — I10 Essential (primary) hypertension: Secondary | ICD-10-CM | POA: Diagnosis not present

## 2023-06-27 DIAGNOSIS — Z0000 Encounter for general adult medical examination without abnormal findings: Secondary | ICD-10-CM

## 2023-06-27 NOTE — Progress Notes (Signed)
 Office Note 06/27/2023  CC:  Chief Complaint  Patient presents with   Annual Exam    Pt is not fasting (had coffee with creamer/sugar)    HPI:  Patient is a 68 y.o. female who is here accompanied by her husband for annual health maintenance exam and follow-up hypertension, obesity, and myofascial pain syndrome.  Last visit 04/24/23 her ESR was elevated in the clinical setting correlating with myalgias c/w polymyalgia rheumatica.  I rx'd steroids but she did not take this b/c of wanting to avoid this med b/c knee replacement surgery was coming up at the time.  She tells me now that she has no more muscle pain.  The only pain she has is in her left knee and she has an appropriate amount of pain in the setting of rehabbing from left total knee arthroplasty on 05/08/2023.  She takes Celebrex  daily for her knee. When she was taking aspirin postop x 1 month she had 3 large nosebleeds.  She is happy to say she has lost 5 pounds and has done it without using phentermine .  She has no acute concerns today.  Past Medical History:  Diagnosis Date   Asthma    Borderline hyperlipidemia 2017/2018   Her 10 yr Framingham risk = 5% in 2018.  LDL improved 02/2017 (on no meds).  Fram risk 03/2018 4.5%. Fram risk 05/2020 6.3%.   Colon cancer screening 03/2016   2018 Cologuard negative.  Repeat 05/2019 Neg.  05/2022 NEG   Concussion with no loss of consciousness 04/01/2008   Depression    Hypertension    Obesity    Osteoarthritis of knees, bilateral    mild/mod on plain films 09/2018.  L mod-to-severe 09/2021-->plan for TKA fall/winter 2024   Perennial allergic rhinitis    Polyarthralgia    Dr. Alva Jewels did rheum w/u which was neg approx 2012--all this got better with wt loss   Right foot pain    Hammertoe, bunion, and dorsal spur to be treated surgically by podiatrist as of spring 2019    Past Surgical History:  Procedure Laterality Date   BREAST BIOPSY     2016 benign   CESAREAN SECTION     x  2   Coronary calcium  score  12/2022   19 (58th%'tile)   DEXA  07/19/15; 11/27/18   2017: 'penia (T-Score -1.4).  11/2018 T score -1.7.  Plan rpt 2 yrs (her GYN has been doing these)   left foot surgery     2nd toe dislocation repair + bunion repair ([facility])   RETINAL LASER PROCEDURE Right 03/12/2019   RETINAL LASER PROCEDURE Left 03/26/2019   ROTATOR CUFF REPAIR  Approx 2009   Right    Family History  Problem Relation Age of Onset   Arthritis Mother    Colon cancer Mother    COPD Father    Hyperlipidemia Father     Social History   Socioeconomic History   Marital status: Married    Spouse name: Not on file   Number of children: Not on file   Years of education: Not on file   Highest education level: 12th grade  Occupational History   Not on file  Tobacco Use   Smoking status: Never   Smokeless tobacco: Never  Vaping Use   Vaping status: Never Used  Substance and Sexual Activity   Alcohol use: No   Drug use: No   Sexual activity: Yes  Other Topics Concern   Not on file  Social  History Narrative   Married 40+ yrs, two daughters, 3 grandchildren.Aaron Aas   [place] prior to [place] since [place]s.   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   No T/A/Ds.   Exercise: Zumba weekly.   Social Drivers of Corporate investment banker Strain: Low Risk  (06/26/2023)   Overall Financial Resource Strain (CARDIA)    Difficulty of Paying Living Expenses: Not hard at all  Food Insecurity: No Food Insecurity (06/26/2023)   Hunger Vital Sign    Worried About Running Out of Food in the Last Year: Never true    Ran Out of Food in the Last Year: Never true  Transportation Needs: No Transportation Needs (06/26/2023)   PRAPARE - Administrator, Civil Service (Medical): No    Lack of Transportation (Non-Medical): No  Physical Activity: Unknown (06/26/2023)   Exercise Vital Sign    Days of Exercise per Week: 0 days    Minutes of Exercise per  Session: Not on file  Stress: No Stress Concern Present (06/26/2023)   [employer] of Occupational Health - Occupational Stress Questionnaire    Feeling of Stress : Only a little  Social Connections: Moderately Integrated (06/26/2023)   Social Connection and Isolation Panel [NHANES]    Frequency of Communication with Friends and Family: More than three times a week    Frequency of Social Gatherings with Friends and Family: Twice a week    Attends Religious Services: 1 to 4 times per year    Active Member of [employer] or Organizations: No    Attends Engineer, structural: Not on file    Marital Status: Married  Catering manager Violence: Not on file    Outpatient Medications Prior to Visit  Medication Sig Dispense Refill   albuterol  (VENTOLIN  HFA) 108 (90 Base) MCG/ACT inhaler TAKE 2 PUFFS BY MOUTH EVERY 6 HOURS AS NEEDED FOR WHEEZE OR SHORTNESS OF BREATH 18 each 1   beclomethasone (QVAR  REDIHALER) 80 MCG/ACT inhaler TAKE 2 PUFFS BY MOUTH TWICE A DAY 10.6 g 11   Calcium  Carbonate-Vitamin D 600-400 MG-UNIT chew tablet Chew 1 tablet by mouth daily.     cetirizine (ZYRTEC) 10 MG tablet Take 10 mg by mouth daily.     fluticasone  (FLONASE ) 50 MCG/ACT nasal spray Place 2 sprays into both nostrils daily. 16 g 6   irbesartan -hydrochlorothiazide  (AVALIDE) 150-12.5 MG tablet 1 tab po qd 90 tablet 3   Multiple Vitamin (MULTIVITAMIN) capsule Take 1 capsule by mouth daily.     olopatadine (PATANOL) 0.1 % ophthalmic solution Place 1 drop into both eyes daily as needed.     Probiotic Product (PROBIOTIC GUMMIES PO) Take by mouth in the morning and at bedtime.     vitamin E 100 UNIT capsule Take 100 Units by mouth daily.     ondansetron  (ZOFRAN ) 4 MG tablet Take 1 tablet (4 mg total) by mouth every 8 (eight) hours as needed for nausea or vomiting. (Patient not taking: Reported on 06/27/2023) 20 tablet 0   phentermine  (ADIPEX-P ) 37.5 MG tablet 1 tab po qd (Patient not taking: Reported on 06/27/2023) 90  tablet 0   predniSONE  (DELTASONE ) 20 MG tablet 1 tab po qd (Patient not taking: Reported on 06/27/2023) 7 tablet 0   doxycycline  (VIBRA -TABS) 100 MG tablet Take 1 tablet (100 mg total) by mouth 2 (two) times daily. (Patient not taking: Reported on 06/27/2023) 30 tablet 1   meloxicam (MOBIC) 15 MG tablet Take 15 mg by mouth daily. (Patient not  taking: Reported on 06/27/2023)     No facility-administered medications prior to visit.    Allergies  Allergen Reactions   Atorvastatin  Calcium  [Atorvastatin ] Other (See Comments)    Abdominal cramps   Augmentin  [Amoxicillin -Pot Clavulanate] Rash    Review of Systems  Constitutional:  Negative for appetite change, chills, fatigue and fever.  HENT:  Negative for congestion, dental problem, ear pain and sore throat.   Eyes:  Negative for discharge, redness and visual disturbance.  Respiratory:  Negative for cough, chest tightness, shortness of breath and wheezing.   Cardiovascular:  Negative for chest pain, palpitations and leg swelling.  Gastrointestinal:  Negative for abdominal pain, blood in stool, diarrhea, nausea and vomiting.  Genitourinary:  Negative for difficulty urinating, dysuria, flank pain, frequency, hematuria and urgency.  Musculoskeletal:  Positive for arthralgias (L knee). Negative for back pain, joint swelling, myalgias and neck stiffness.  Skin:  Negative for pallor and rash.  Neurological:  Negative for dizziness, speech difficulty, weakness and headaches.  Hematological:  Negative for adenopathy. Does not bruise/bleed easily.  Psychiatric/Behavioral:  Negative for confusion and sleep disturbance. The patient is not nervous/anxious.     PE;    06/27/2023    8:59 AM 04/24/2023    3:28 PM 01/16/2023   10:26 AM  Vitals with BMI  Height 5' 3.5"    Weight 181 lbs 3 oz 186 lbs 6 oz 188 lbs  BMI 31.59    Systolic 128 144 161  Diastolic 80 82 80  Pulse 79 62 62   Gen: Alert, well appearing.  Patient is oriented to person, place,  time, and situation. AFFECT: pleasant, lucid thought and speech. ENT: Ears: EACs clear, normal epithelium.  TMs with good light reflex and landmarks bilaterally.  Eyes: no injection, icteris, swelling, or exudate.  EOMI, PERRLA. Nose: no drainage or turbinate edema/swelling.  No injection or focal lesion.  Mouth: lips without lesion/swelling.  Oral mucosa pink and moist.  Dentition intact and without obvious caries or gingival swelling.  Oropharynx without erythema, exudate, or swelling.  Neck: supple/nontender.  No LAD, mass, or TM.  Carotid pulses 2+ bilaterally, without bruits. CV: RRR, no m/r/g.   LUNGS: CTA bilat, nonlabored resps, good aeration in all lung fields. ABD: soft, NT, ND, BS normal.  No hepatospenomegaly or mass.  No bruits. EXT: no clubbing or cyanosis. She has a moderate amount of diffuse swelling of the left knee, well-healed surgical scar.  Mild warmth of the left knee.  No erythema or tenderness.  Left lower leg with trace to 1+ pitting.  She has a compression stocking on this leg. Right lower leg with trace pitting edema.   Musculoskeletal: Other than her left knee she has no joint swelling, erythema, warmth, or tenderness.  ROM of all joints intact.  No muscle tenderness. Skin - no sores or suspicious lesions or rashes or color changes  Pertinent labs:  Lab Results  Component Value Date   TSH 1.72 05/18/2022   Lab Results  Component Value Date   WBC 6.3 05/18/2022   HGB 13.2 05/18/2022   HCT 39.0 05/18/2022   MCV 87.2 05/18/2022   PLT 270 05/18/2022   Lab Results  Component Value Date   CREATININE 0.65 12/03/2022   BUN 18 12/03/2022   NA 143 12/03/2022   K 4.0 12/03/2022   CL 103 12/03/2022   CO2 32 12/03/2022   Lab Results  Component Value Date   ALT 21 12/03/2022   AST 17 12/03/2022  ALKPHOS 65 12/03/2022   BILITOT 0.7 12/03/2022   Lab Results  Component Value Date   CHOL 250 (H) 12/03/2022   Lab Results  Component Value Date   HDL 74.80  12/03/2022   Lab Results  Component Value Date   LDLCALC 158 (H) 12/03/2022   Lab Results  Component Value Date   TRIG 87.0 12/03/2022   Lab Results  Component Value Date   CHOLHDL 3 12/03/2022   Lab Results  Component Value Date   HGBA1C 5.6 05/18/2022   Lab Results  Component Value Date   ESRSEDRATE 60 (H) 04/24/2023   ASSESSMENT AND PLAN:   1 health maintenance exam: Reviewed age and gender appropriate health maintenance issues (prudent diet, regular exercise, health risks of tobacco and excessive alcohol, use of seatbelts, fire alarms in home, use of sunscreen).  Also reviewed age and gender appropriate health screening as well as vaccine recommendations. Vaccines: ALL UTD. Labs: fasting HP labs ordered. Cervical ca screening: as per her GYN MD. Breast ca screening: Negative mammogram 06/30/2022 with [facility]-->scheduled. Colon ca screening: Repeat Cologuard due April 2027 Osteoporosis screening: History of osteopenia.  Her GYN does her screening.  #2 history of myofascial pain. History of elevated sed rate. We had planned on prednisone  therapy for polymyalgia rheumatica a couple of months ago but her pain resolved without taking the medication.  She is currently feeling well. We will periodically monitor sed rate.  #3 hypertension, doing well on a irbesartan -HCTZ 150-12.5 tabs, 1 tab daily. Check electrolytes and creatinine today.  An After Visit Summary was printed and given to the patient.  FOLLOW UP:  Return in about 6 months (around 12/28/2023) for routine chronic illness f/u.  Signed:  Arletha Lady, MD           06/27/2023

## 2023-06-28 ENCOUNTER — Encounter: Payer: Self-pay | Admitting: Family Medicine

## 2023-06-28 DIAGNOSIS — M25662 Stiffness of left knee, not elsewhere classified: Secondary | ICD-10-CM | POA: Diagnosis not present

## 2023-06-28 DIAGNOSIS — M6281 Muscle weakness (generalized): Secondary | ICD-10-CM | POA: Diagnosis not present

## 2023-06-28 DIAGNOSIS — Z96652 Presence of left artificial knee joint: Secondary | ICD-10-CM | POA: Diagnosis not present

## 2023-06-28 LAB — HEMOGLOBIN A1C
Hgb A1c MFr Bld: 5.2 % (ref <5.7)
Mean Plasma Glucose: 103 mg/dL
eAG (mmol/L): 5.7 mmol/L

## 2023-06-28 LAB — CBC WITH DIFFERENTIAL/PLATELET
Absolute Lymphocytes: 1080 {cells}/uL (ref 850–3900)
Absolute Monocytes: 581 {cells}/uL (ref 200–950)
Basophils Absolute: 38 {cells}/uL (ref 0–200)
Basophils Relative: 0.8 %
Eosinophils Absolute: 192 {cells}/uL (ref 15–500)
Eosinophils Relative: 4 %
HCT: 39.8 % (ref 35.0–45.0)
Hemoglobin: 12.6 g/dL (ref 11.7–15.5)
MCH: 27.9 pg (ref 27.0–33.0)
MCHC: 31.7 g/dL — ABNORMAL LOW (ref 32.0–36.0)
MCV: 88.2 fL (ref 80.0–100.0)
MPV: 10.1 fL (ref 7.5–12.5)
Monocytes Relative: 12.1 %
Neutro Abs: 2909 {cells}/uL (ref 1500–7800)
Neutrophils Relative %: 60.6 %
Platelets: 272 10*3/uL (ref 140–400)
RBC: 4.51 10*6/uL (ref 3.80–5.10)
RDW: 12.3 % (ref 11.0–15.0)
Total Lymphocyte: 22.5 %
WBC: 4.8 10*3/uL (ref 3.8–10.8)

## 2023-06-28 LAB — COMPREHENSIVE METABOLIC PANEL WITH GFR
AG Ratio: 1.1 (calc) (ref 1.0–2.5)
ALT: 14 U/L (ref 6–29)
AST: 14 U/L (ref 10–35)
Albumin: 3.7 g/dL (ref 3.6–5.1)
Alkaline phosphatase (APISO): 68 U/L (ref 37–153)
BUN: 18 mg/dL (ref 7–25)
CO2: 32 mmol/L (ref 20–32)
Calcium: 9.7 mg/dL (ref 8.6–10.4)
Chloride: 100 mmol/L (ref 98–110)
Creat: 0.66 mg/dL (ref 0.50–1.05)
Globulin: 3.4 g/dL (ref 1.9–3.7)
Glucose, Bld: 80 mg/dL (ref 65–99)
Potassium: 3.9 mmol/L (ref 3.5–5.3)
Sodium: 139 mmol/L (ref 135–146)
Total Bilirubin: 0.6 mg/dL (ref 0.2–1.2)
Total Protein: 7.1 g/dL (ref 6.1–8.1)
eGFR: 95 mL/min/{1.73_m2} (ref >=60)

## 2023-06-28 LAB — SEDIMENTATION RATE: Sed Rate: 34 mm/h — ABNORMAL HIGH (ref 0–30)

## 2023-06-28 LAB — LIPID PANEL
Cholesterol: 256 mg/dL — ABNORMAL HIGH (ref <200)
HDL: 73 mg/dL (ref >=50)
LDL Cholesterol (Calc): 160 mg/dL — ABNORMAL HIGH
Non-HDL Cholesterol (Calc): 183 mg/dL — ABNORMAL HIGH (ref <130)
Total CHOL/HDL Ratio: 3.5 (calc) (ref <5.0)
Triglycerides: 113 mg/dL (ref <150)

## 2023-07-01 DIAGNOSIS — Z96652 Presence of left artificial knee joint: Secondary | ICD-10-CM | POA: Diagnosis not present

## 2023-07-01 DIAGNOSIS — M6281 Muscle weakness (generalized): Secondary | ICD-10-CM | POA: Diagnosis not present

## 2023-07-01 DIAGNOSIS — M25662 Stiffness of left knee, not elsewhere classified: Secondary | ICD-10-CM | POA: Diagnosis not present

## 2023-07-03 DIAGNOSIS — M25662 Stiffness of left knee, not elsewhere classified: Secondary | ICD-10-CM | POA: Diagnosis not present

## 2023-07-03 DIAGNOSIS — M6281 Muscle weakness (generalized): Secondary | ICD-10-CM | POA: Diagnosis not present

## 2023-07-03 DIAGNOSIS — Z96652 Presence of left artificial knee joint: Secondary | ICD-10-CM | POA: Diagnosis not present

## 2023-07-08 DIAGNOSIS — Z96652 Presence of left artificial knee joint: Secondary | ICD-10-CM | POA: Diagnosis not present

## 2023-07-08 DIAGNOSIS — M6281 Muscle weakness (generalized): Secondary | ICD-10-CM | POA: Diagnosis not present

## 2023-07-08 DIAGNOSIS — M25662 Stiffness of left knee, not elsewhere classified: Secondary | ICD-10-CM | POA: Diagnosis not present

## 2023-07-10 DIAGNOSIS — Z96652 Presence of left artificial knee joint: Secondary | ICD-10-CM | POA: Diagnosis not present

## 2023-07-10 DIAGNOSIS — M25662 Stiffness of left knee, not elsewhere classified: Secondary | ICD-10-CM | POA: Diagnosis not present

## 2023-07-10 DIAGNOSIS — M6281 Muscle weakness (generalized): Secondary | ICD-10-CM | POA: Diagnosis not present

## 2023-07-11 DIAGNOSIS — M25562 Pain in left knee: Secondary | ICD-10-CM | POA: Diagnosis not present

## 2023-07-11 DIAGNOSIS — Z96652 Presence of left artificial knee joint: Secondary | ICD-10-CM | POA: Diagnosis not present

## 2023-07-11 DIAGNOSIS — M25662 Stiffness of left knee, not elsewhere classified: Secondary | ICD-10-CM | POA: Diagnosis not present

## 2023-07-11 DIAGNOSIS — M6281 Muscle weakness (generalized): Secondary | ICD-10-CM | POA: Diagnosis not present

## 2023-07-12 ENCOUNTER — Telehealth: Payer: Self-pay

## 2023-07-12 NOTE — Telephone Encounter (Signed)
 Communication  Reason for CRM: Patient  called In to let provider know that she is going to have a  manipulation MUA  on her knee on may 19,2025 to help her knee bend to 105 degrees. She just want to make him aware.   FYI.

## 2023-07-12 NOTE — Telephone Encounter (Signed)
 noted

## 2023-07-15 DIAGNOSIS — M24662 Ankylosis, left knee: Secondary | ICD-10-CM | POA: Diagnosis not present

## 2023-07-15 DIAGNOSIS — M25662 Stiffness of left knee, not elsewhere classified: Secondary | ICD-10-CM | POA: Diagnosis not present

## 2023-07-15 DIAGNOSIS — G8918 Other acute postprocedural pain: Secondary | ICD-10-CM | POA: Diagnosis not present

## 2023-07-15 DIAGNOSIS — Z96652 Presence of left artificial knee joint: Secondary | ICD-10-CM | POA: Diagnosis not present

## 2023-07-18 DIAGNOSIS — M25662 Stiffness of left knee, not elsewhere classified: Secondary | ICD-10-CM | POA: Diagnosis not present

## 2023-07-24 DIAGNOSIS — Z96652 Presence of left artificial knee joint: Secondary | ICD-10-CM | POA: Diagnosis not present

## 2023-07-24 DIAGNOSIS — M6281 Muscle weakness (generalized): Secondary | ICD-10-CM | POA: Diagnosis not present

## 2023-07-24 DIAGNOSIS — M25662 Stiffness of left knee, not elsewhere classified: Secondary | ICD-10-CM | POA: Diagnosis not present

## 2023-08-22 DIAGNOSIS — M25462 Effusion, left knee: Secondary | ICD-10-CM | POA: Diagnosis not present

## 2023-08-28 DIAGNOSIS — Z1231 Encounter for screening mammogram for malignant neoplasm of breast: Secondary | ICD-10-CM | POA: Diagnosis not present

## 2023-08-28 LAB — HM MAMMOGRAPHY

## 2023-09-26 DIAGNOSIS — Z008 Encounter for other general examination: Secondary | ICD-10-CM | POA: Diagnosis not present

## 2023-09-30 DIAGNOSIS — M25662 Stiffness of left knee, not elsewhere classified: Secondary | ICD-10-CM | POA: Diagnosis not present

## 2023-10-24 ENCOUNTER — Other Ambulatory Visit: Payer: Self-pay | Admitting: Family Medicine

## 2023-10-25 ENCOUNTER — Other Ambulatory Visit: Payer: Self-pay | Admitting: Family Medicine

## 2023-10-25 MED ORDER — CELECOXIB 200 MG PO CAPS: 200.0000 mg | ORAL_CAPSULE | Freq: Every day | ORAL | 3 refills | Status: DC

## 2023-10-25 NOTE — Telephone Encounter (Signed)
 Copied from CRM [id number]. Topic: Clinical - Medication Refill >> Oct 25, 2023 12:21 PM Suzen RAMAN wrote: Medication: celecoxib  (CELEBREX ) 200 MG capsule   Has the patient contacted their pharmacy? Yes   This is the patient's preferred pharmacy:  [id number] [place], [place] - [address] [place] [address] Phone: [phone] Fax: [phone]  Is this the correct pharmacy for this prescription? Yes If no, delete pharmacy and type the correct one.   Has the prescription been filled recently? No  Is the patient out of the medication? No-has 5 pills left  Has the patient been seen for an appointment in the last year OR does the patient have an upcoming appointment? Yes  Can we respond through MyChart? Yes  Agent: Please be advised that Rx refills may take up to 3 business days. We ask that you follow-up with your pharmacy.

## 2023-11-20 ENCOUNTER — Ambulatory Visit

## 2023-11-20 VITALS — Wt 181.0 lb

## 2023-11-20 DIAGNOSIS — Z0000 Encounter for general adult medical examination without abnormal findings: Secondary | ICD-10-CM

## 2023-11-20 NOTE — Progress Notes (Unsigned)
 Subjective:   Laura Green is a 68 y.o. female who presents for Medicare Annual (Subsequent) preventive examination.  Visit Complete: Virtual I connected with  Laura Green on 11/20/23 by a audio enabled telemedicine application and verified that I am speaking with the correct person using two identifiers.  Patient Location: Home  Provider Location: Home Office  I discussed the limitations of evaluation and management by telemedicine. The patient expressed understanding and agreed to proceed.  Vital Signs: Because this visit was a virtual/telehealth visit, some criteria may be missing or patient reported. Any vitals not documented were not able to be obtained and vitals that have been documented are patient reported.  Cardiac Risk Factors include: advanced age (>10men, >25 women);hypertension     Objective:    Today's Vitals   11/20/23 1555  Weight: 181 lb (82.1 kg)   Body mass index is 31.56 kg/m.     11/20/2023    3:50 PM  Advanced Directives  Does Patient Have a Medical Advance Directive? Yes  Type of Advance Directive Healthcare Power of Attorney  Copy of Healthcare Power of Attorney in Chart? No - copy requested    Current Medications (verified) Outpatient Encounter Medications as of 11/20/2023  Medication Sig   albuterol  (VENTOLIN  HFA) 108 (90 Base) MCG/ACT inhaler TAKE 2 PUFFS BY MOUTH EVERY 6 HOURS AS NEEDED FOR WHEEZE OR SHORTNESS OF BREATH   beclomethasone (QVAR  REDIHALER) 80 MCG/ACT inhaler TAKE 2 PUFFS BY MOUTH TWICE A DAY   Calcium  Carbonate-Vitamin D 600-400 MG-UNIT chew tablet Chew 1 tablet by mouth daily.   celecoxib  (CELEBREX ) 200 MG capsule Take 1 capsule (200 mg total) by mouth daily.   cetirizine (ZYRTEC) 10 MG tablet Take 10 mg by mouth daily.   fluticasone  (FLONASE ) 50 MCG/ACT nasal spray Place 2 sprays into both nostrils daily.   irbesartan -hydrochlorothiazide  (AVALIDE) 150-12.5 MG tablet TAKE 1 TABLET BY MOUTH EVERY DAY   Multiple  Vitamin (MULTIVITAMIN) capsule Take 1 capsule by mouth daily.   olopatadine (PATANOL) 0.1 % ophthalmic solution Place 1 drop into both eyes daily as needed.   Probiotic Product (PROBIOTIC GUMMIES PO) Take by mouth in the morning and at bedtime.   vitamin E 100 UNIT capsule Take 100 Units by mouth daily.   No facility-administered encounter medications on file as of 11/20/2023.    Allergies (verified) Atorvastatin  calcium  [atorvastatin ] and Augmentin  [amoxicillin -pot clavulanate]   History: Past Medical History:  Diagnosis Date   Asthma    Borderline hyperlipidemia 2017/2018   Her 10 yr Framingham risk = 5% in 2018.  LDL improved 02/2017 (on no meds).  Fram risk 03/2018 4.5%. Fram risk 05/2020 6.3%.   Colon cancer screening 03/2016   2018 Cologuard negative.  Repeat 05/2019 Neg.  05/2022 NEG   Concussion with no loss of consciousness 04/01/2008   Depression    Hypertension    Obesity    Osteoarthritis of knees, bilateral    mild/mod on plain films 09/2018.  L mod-to-severe 09/2021-->plan for TKA fall/winter 2024   Perennial allergic rhinitis    Polyarthralgia    Dr. Lenon did rheum w/u which was neg approx 2012--all this got better with wt loss   Polymyalgia rheumatica 2025   question of (hx elev sed rate in the approp clinical setting.  sx's resolved w/out steroids (spring 2025)   Right foot pain    Hammertoe, bunion, and dorsal spur to be treated surgically by podiatrist as of spring 2019   Past Surgical History:  Procedure  Laterality Date   BREAST BIOPSY     2016 benign   CESAREAN SECTION     x 2   Coronary calcium  score  12/2022   19 (58th%'tile)   DEXA  07/19/15; 11/27/18   2017: 'penia (T-Score -1.4).  11/2018 T score -1.7.  Plan rpt 2 yrs (her GYN has been doing these)   left foot surgery     2nd toe dislocation repair + bunion repair ([facility])   RETINAL LASER PROCEDURE Right 03/12/2019   RETINAL LASER PROCEDURE Left 03/26/2019   ROTATOR CUFF REPAIR  Approx  2009   Right   Family History  Problem Relation Age of Onset   Arthritis Mother    Colon cancer Mother    COPD Father    Hyperlipidemia Father    Social History   Socioeconomic History   Marital status: Married    Spouse name: Not on file   Number of children: Not on file   Years of education: Not on file   Highest education level: 12th grade  Occupational History   Not on file  Tobacco Use   Smoking status: Never   Smokeless tobacco: Never  Vaping Use   Vaping status: Never Used  Substance and Sexual Activity   Alcohol use: No   Drug use: No   Sexual activity: Yes  Other Topics Concern   Not on file  Social History Narrative   Married 40+ yrs, two daughters, 3 grandchildren.SABRA   [place] prior to [place]--[place] since 1990s.   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   No T/A/Ds.   Exercise: Zumba weekly.   Social Drivers of Corporate investment banker Strain: Low Risk  (11/20/2023)   Overall Financial Resource Strain (CARDIA)    Difficulty of Paying Living Expenses: Not hard at all  Food Insecurity: No Food Insecurity (11/20/2023)   Hunger Vital Sign    Worried About Running Out of Food in the Last Year: Never true    Ran Out of Food in the Last Year: Never true  Transportation Needs: No Transportation Needs (11/20/2023)   PRAPARE - Administrator, Civil Service (Medical): No    Lack of Transportation (Non-Medical): No  Physical Activity: Sufficiently Active (11/20/2023)   Exercise Vital Sign    Days of Exercise per Week: 3 days    Minutes of Exercise per Session: 60 min  Stress: No Stress Concern Present (11/20/2023)   [employer] of Occupational Health - Occupational Stress Questionnaire    Feeling of Stress: Not at all  Social Connections: Socially Integrated (11/20/2023)   Social Connection and Isolation Panel    Frequency of Communication with Friends and Family: More than three times a week    Frequency of Social  Gatherings with Friends and Family: More than three times a week    Attends Religious Services: 1 to 4 times per year    Active Member of [employer] or Organizations: Yes    Attends Engineer, structural: More than 4 times per year    Marital Status: Married    Tobacco Counseling Counseling given: Not Answered   Clinical Intake:  Pre-visit preparation completed: Yes  Pain : No/denies pain     Diabetes: No  How often do you need to have someone help you when you read instructions, pamphlets, or other written materials from your doctor or pharmacy?: 1 - Never  Interpreter Needed?: No  Information entered by :: Mliss Graff LPN  Activities of Daily Living    11/20/2023    3:55 PM  In your present state of health, do you have any difficulty performing the following activities:  Hearing? 0  Vision? 0  Difficulty concentrating or making decisions? 0  Walking or climbing stairs? 0  Dressing or bathing? 0  Doing errands, shopping? 0  Preparing Food and eating ? N  Using the Toilet? N  In the past six months, have you accidently leaked urine? N  Do you have problems with loss of bowel control? N  Managing your Medications? N  Managing your Finances? N  Housekeeping or managing your Housekeeping? N    Patient Care Team: Candise Aleene DEL, MD as PCP - General (Family Medicine) Estelle Service, MD as Consulting Physician (Obstetrics and Gynecology) Magdalen Pasco RAMAN, DPM as Consulting Physician (Podiatry) Yvone Rush, MD as Consulting Physician (Orthopedic Surgery)  Indicate any recent Medical Services you may have received from other than [facility] providers in the past year (date may be approximate).     Assessment:   This is a routine wellness examination for Shaniyah.  Hearing/Vision screen Hearing Screening - Comments:: No trouble hearing Vision Screening - Comments:: Up to date [facility]   Goals Addressed             This Visit's Progress    Patient Stated        Walk without pain on trip to [place]       Depression Screen    11/20/2023    3:55 PM 06/27/2023    9:03 AM 01/16/2023   10:28 AM 12/03/2022    8:12 AM 08/20/2022    3:38 PM 05/18/2022    3:50 PM 10/20/2021    1:38 PM  PHQ 2/9 Scores  PHQ - 2 Score 0 0 0 0 0 0 0  PHQ- 9 Score 0          Fall Risk    11/20/2023    3:47 PM 06/27/2023    9:03 AM 01/16/2023   10:28 AM 12/03/2022    8:11 AM 08/20/2022    3:38 PM  Fall Risk   Falls in the past year? 0 0 0 0 0  Number falls in past yr: 0 0   0  Injury with Fall? 0 0   0  Risk for fall due to :  No Fall Risks   No Fall Risks  Follow up Falls evaluation completed;Education provided;Falls prevention discussed Falls evaluation completed Falls evaluation completed Falls evaluation completed Falls evaluation completed    MEDICARE RISK AT HOME: Medicare Risk at Home Any stairs in or around the home?: No If so, are there any without handrails?: No Home free of loose throw rugs in walkways, pet beds, electrical cords, etc?: Yes Adequate lighting in your home to reduce risk of falls?: Yes Life alert?: No Use of a cane, walker or w/c?: No Grab bars in the bathroom?: Yes Shower chair or bench in shower?: No Elevated toilet seat or a handicapped toilet?: Yes  TIMED UP AND GO:  Was the test performed?  No    Cognitive Function:        11/20/2023    3:51 PM  6CIT Screen  What Year? 0 points  What month? 0 points  What time? 0 points  Count back from 20 0 points  Months in reverse 0 points  Repeat phrase 0 points  Total Score 0 points    Immunizations Immunization History  Administered Date(s) Administered  Fluad Quad(high Dose 65+) 12/17/2021   Fluad Trivalent(High Dose 65+) 12/03/2022   INFLUENZA, HIGH DOSE SEASONAL PF 01/08/2021   Influenza,inj,Quad PF,6+ Mos 12/21/2018   Influenza-Unspecified 10/27/2017, 11/27/2019   PFIZER(Purple Top)SARS-COV-2 Vaccination 05/12/2019, 06/02/2019, 01/30/2020   PNEUMOCOCCAL CONJUGATE-20  05/18/2022   Pneumococcal Polysaccharide-23 05/30/2020   Td 02/27/1996, 08/26/1996, 09/22/2008   Tdap 04/15/2019   Zoster Recombinant(Shingrix ) 03/27/2017, 06/28/2017   Zoster, Live 03/31/2016    TDAP status: Up to date  Flu Vaccine status: Due, Education has been provided regarding the importance of this vaccine. Advised may receive this vaccine at local pharmacy or Health Dept. Aware to provide a copy of the vaccination record if obtained from local pharmacy or Health Dept. Verbalized acceptance and understanding.  Pneumococcal vaccine status: Up to date  Covid-19 vaccine status: Declined, Education has been provided regarding the importance of this vaccine but patient still declined. Advised may receive this vaccine at local pharmacy or Health Dept.or vaccine clinic. Aware to provide a copy of the vaccination record if obtained from local pharmacy or Health Dept. Verbalized acceptance and understanding.  Qualifies for Shingles Vaccine? No   Zostavax completed Yes   Shingrix  Completed?: Yes  Screening Tests Health Maintenance  Topic Date Due   Influenza Vaccine  09/27/2023   Mammogram  08/27/2024   Medicare Annual Wellness (AWV)  11/19/2024   Fecal DNA (Cologuard)  06/09/2025   DTaP/Tdap/Td (5 - Td or Tdap) 04/14/2029   Pneumococcal Vaccine: 50+ Years  Completed   DEXA SCAN  Completed   Hepatitis C Screening  Completed   Zoster Vaccines- Shingrix   Completed   HPV VACCINES  Aged Out   Meningococcal B Vaccine  Aged Out   Colonoscopy  Discontinued   COVID-19 Vaccine  Discontinued    Health Maintenance  Health Maintenance Due  Topic Date Due   Influenza Vaccine  09/27/2023    Colorectal cancer screening: Type of screening: Cologuard. Completed 2024. Repeat every 3 years  Mammogram status: Completed  . Repeat every year  Bone Density  Education provided  Lung Cancer Screening: (Low Dose CT Chest recommended if Age 53-80 years, 20 pack-year currently smoking OR have  quit w/in 15years.) does not qualify.   Lung Cancer Screening Referral:   Additional Screening:  Hepatitis C Screening  never done  Vision Screening: Recommended annual ophthalmology exams for early detection of glaucoma and other disorders of the eye. Is the patient up to date with their annual eye exam?  Yes  Who is the provider or what is the name of the office in which the patient attends annual eye exams? [facility] If pt is not established with a provider, would they like to be referred to a provider to establish care? No .   Dental Screening: Recommended annual dental exams for proper oral hygiene    Community Resource Referral / Chronic Care Management: CRR required this visit?  No   CCM required this visit?  No     Plan:     I have personally reviewed and noted the following in the patient's chart:   Medical and social history Use of alcohol, tobacco or illicit drugs  Current medications and supplements including opioid prescriptions. Patient is not currently taking opioid prescriptions. Functional ability and status Nutritional status Physical activity Advanced directives List of other physicians Hospitalizations, surgeries, and ER visits in previous 12 months Vitals Screenings to include cognitive, depression, and falls Referrals and appointments  In addition, I have reviewed and discussed with patient certain preventive protocols,  quality metrics, and best practice recommendations. A written personalized care plan for preventive services as well as general preventive health recommendations were provided to patient.     Mliss Graff, LPN   [id number]   After Visit Summary: (MyChart) Due to this being a telephonic visit, the after visit summary with patients personalized plan was offered to patient via MyChart   Nurse Notes:

## 2023-11-20 NOTE — Patient Instructions (Signed)
 Ms. Laura Green , Thank you for taking time to come for your Medicare Wellness Visit. I appreciate your ongoing commitment to your health goals. Please review the following plan we discussed and let me know if I can assist you in the future.   Screening recommendations/referrals: Colonoscopy: up to date Mammogram: up to date Bone Density: Education provided Recommended yearly ophthalmology/optometry visit for glaucoma screening and checkup Recommended yearly dental visit for hygiene and checkup  Vaccinations: Influenza vaccine: Education provided Pneumococcal vaccine: up to date Tdap vaccine: up to date Shingles vaccine: up to date       Preventive Care 65 Years and Older, Female Preventive care refers to lifestyle choices and visits with your health care provider that can promote health and wellness. What does preventive care include? A yearly physical exam. This is also called an annual well check. Dental exams once or twice a year. Routine eye exams. Ask your health care provider how often you should have your eyes checked. Personal lifestyle choices, including: Daily care of your teeth and gums. Regular physical activity. Eating a healthy diet. Avoiding tobacco and drug use. Limiting alcohol use. Practicing safe sex. Taking low-dose aspirin every day. Taking vitamin and mineral supplements as recommended by your health care provider. What happens during an annual well check? The services and screenings done by your health care provider during your annual well check will depend on your age, overall health, lifestyle risk factors, and family history of disease. Counseling  Your health care provider may ask you questions about your: Alcohol use. Tobacco use. Drug use. Emotional well-being. Home and relationship well-being. Sexual activity. Eating habits. History of falls. Memory and ability to understand (cognition). Work and work Astronomer. Reproductive  health. Screening  You may have the following tests or measurements: Height, weight, and BMI. Blood pressure. Lipid and cholesterol levels. These may be checked every 5 years, or more frequently if you are over 60 years old. Skin check. Lung cancer screening. You may have this screening every year starting at age 68 if you have a 30-pack-year history of smoking and currently smoke or have quit within the past 15 years. Fecal occult blood test (FOBT) of the stool. You may have this test every year starting at age 33. Flexible sigmoidoscopy or colonoscopy. You may have a sigmoidoscopy every 5 years or a colonoscopy every 10 years starting at age 61. Hepatitis C blood test. Hepatitis B blood test. Sexually transmitted disease (STD) testing. Diabetes screening. This is done by checking your blood sugar (glucose) after you have not eaten for a while (fasting). You may have this done every 1-3 years. Bone density scan. This is done to screen for osteoporosis. You may have this done starting at age 86. Mammogram. This may be done every 1-2 years. Talk to your health care provider about how often you should have regular mammograms. Talk with your health care provider about your test results, treatment options, and if necessary, the need for more tests. Vaccines  Your health care provider may recommend certain vaccines, such as: Influenza vaccine. This is recommended every year. Tetanus, diphtheria, and acellular pertussis (Tdap, Td) vaccine. You may need a Td booster every 10 years. Zoster vaccine. You may need this after age 55. Pneumococcal 13-valent conjugate (PCV13) vaccine. One dose is recommended after age 51. Pneumococcal polysaccharide (PPSV23) vaccine. One dose is recommended after age 62. Talk to your health care provider about which screenings and vaccines you need and how often you need them. This information  is not intended to replace advice given to you by your health care provider.  Make sure you discuss any questions you have with your health care provider. Document Released: 03/11/2015 Document Revised: 11/02/2015 Document Reviewed: 12/14/2014 Elsevier Interactive Patient Education  2017 [employer].  Fall Prevention in the Home Falls can cause injuries. They can happen to people of all ages. There are many things you can do to make your home safe and to help prevent falls. What can I do on the outside of my home? Regularly fix the edges of walkways and driveways and fix any cracks. Remove anything that might make you trip as you walk through a door, such as a raised step or threshold. Trim any bushes or trees on the path to your home. Use bright outdoor lighting. Clear any walking paths of anything that might make someone trip, such as rocks or tools. Regularly check to see if handrails are loose or broken. Make sure that both sides of any steps have handrails. Any raised decks and porches should have guardrails on the edges. Have any leaves, snow, or ice cleared regularly. Use sand or salt on walking paths during winter. Clean up any spills in your garage right away. This includes oil or grease spills. What can I do in the bathroom? Use night lights. Install grab bars by the toilet and in the tub and shower. Do not use towel bars as grab bars. Use non-skid mats or decals in the tub or shower. If you need to sit down in the shower, use a plastic, non-slip stool. Keep the floor dry. Clean up any water that spills on the floor as soon as it happens. Remove soap buildup in the tub or shower regularly. Attach bath mats securely with double-sided non-slip rug tape. Do not have throw rugs and other things on the floor that can make you trip. What can I do in the bedroom? Use night lights. Make sure that you have a light by your bed that is easy to reach. Do not use any sheets or blankets that are too big for your bed. They should not hang down onto the floor. Have a  firm chair that has side arms. You can use this for support while you get dressed. Do not have throw rugs and other things on the floor that can make you trip. What can I do in the kitchen? Clean up any spills right away. Avoid walking on wet floors. Keep items that you use a lot in easy-to-reach places. If you need to reach something above you, use a strong step stool that has a grab bar. Keep electrical cords out of the way. Do not use floor polish or wax that makes floors slippery. If you must use wax, use non-skid floor wax. Do not have throw rugs and other things on the floor that can make you trip. What can I do with my stairs? Do not leave any items on the stairs. Make sure that there are handrails on both sides of the stairs and use them. Fix handrails that are broken or loose. Make sure that handrails are as long as the stairways. Check any carpeting to make sure that it is firmly attached to the stairs. Fix any carpet that is loose or worn. Avoid having throw rugs at the top or bottom of the stairs. If you do have throw rugs, attach them to the floor with carpet tape. Make sure that you have a light switch at the top of  the stairs and the bottom of the stairs. If you do not have them, ask someone to add them for you. What else can I do to help prevent falls? Wear shoes that: Do not have high heels. Have rubber bottoms. Are comfortable and fit you well. Are closed at the toe. Do not wear sandals. If you use a stepladder: Make sure that it is fully opened. Do not climb a closed stepladder. Make sure that both sides of the stepladder are locked into place. Ask someone to hold it for you, if possible. Clearly mark and make sure that you can see: Any grab bars or handrails. First and last steps. Where the edge of each step is. Use tools that help you move around (mobility aids) if they are needed. These include: Canes. Walkers. Scooters. Crutches. Turn on the lights when you  go into a dark area. Replace any light bulbs as soon as they burn out. Set up your furniture so you have a clear path. Avoid moving your furniture around. If any of your floors are uneven, fix them. If there are any pets around you, be aware of where they are. Review your medicines with your doctor. Some medicines can make you feel dizzy. This can increase your chance of falling. Ask your doctor what other things that you can do to help prevent falls. This information is not intended to replace advice given to you by your health care provider. Make sure you discuss any questions you have with your health care provider. Document Released: 12/09/2008 Document Revised: 07/21/2015 Document Reviewed: 03/19/2014 Elsevier Interactive Patient Education  2017 [employer].

## 2023-12-19 DIAGNOSIS — Z96652 Presence of left artificial knee joint: Secondary | ICD-10-CM | POA: Diagnosis not present

## 2023-12-19 DIAGNOSIS — M25462 Effusion, left knee: Secondary | ICD-10-CM | POA: Diagnosis not present

## 2023-12-23 NOTE — Telephone Encounter (Signed)
 No further action needed at this time. Results in media tab of chart

## 2023-12-25 DIAGNOSIS — M25662 Stiffness of left knee, not elsewhere classified: Secondary | ICD-10-CM | POA: Diagnosis not present

## 2023-12-25 DIAGNOSIS — Z96652 Presence of left artificial knee joint: Secondary | ICD-10-CM | POA: Diagnosis not present

## 2023-12-26 ENCOUNTER — Encounter: Payer: Self-pay | Admitting: Family Medicine

## 2023-12-30 ENCOUNTER — Ambulatory Visit (INDEPENDENT_AMBULATORY_CARE_PROVIDER_SITE_OTHER): Admitting: Family Medicine

## 2023-12-30 ENCOUNTER — Ambulatory Visit: Payer: Self-pay | Admitting: Family Medicine

## 2023-12-30 ENCOUNTER — Encounter: Payer: Self-pay | Admitting: Family Medicine

## 2023-12-30 VITALS — BP 124/74 | HR 71 | Temp 98.2°F | Ht 63.5 in | Wt 187.8 lb

## 2023-12-30 DIAGNOSIS — Z791 Long term (current) use of non-steroidal anti-inflammatories (NSAID): Secondary | ICD-10-CM | POA: Diagnosis not present

## 2023-12-30 DIAGNOSIS — I10 Essential (primary) hypertension: Secondary | ICD-10-CM | POA: Diagnosis not present

## 2023-12-30 DIAGNOSIS — G8929 Other chronic pain: Secondary | ICD-10-CM

## 2023-12-30 DIAGNOSIS — E7800 Pure hypercholesterolemia, unspecified: Secondary | ICD-10-CM | POA: Diagnosis not present

## 2023-12-30 DIAGNOSIS — M7918 Myalgia, other site: Secondary | ICD-10-CM | POA: Diagnosis not present

## 2023-12-30 LAB — BASIC METABOLIC PANEL WITH GFR
BUN: 20 mg/dL (ref 6–23)
CO2: 33 meq/L — ABNORMAL HIGH (ref 19–32)
Calcium: 9.2 mg/dL (ref 8.4–10.5)
Chloride: 103 meq/L (ref 96–112)
Creatinine, Ser: 0.64 mg/dL (ref 0.40–1.20)
GFR: 90.36 mL/min (ref >60.00)
Glucose, Bld: 78 mg/dL (ref 70–99)
Potassium: 4.1 meq/L (ref 3.5–5.1)
Sodium: 143 meq/L (ref 135–145)

## 2023-12-30 LAB — LIPID PANEL
Cholesterol: 272 mg/dL — ABNORMAL HIGH (ref 0–200)
HDL: 79.9 mg/dL (ref >39.00)
LDL Cholesterol: 176 mg/dL — ABNORMAL HIGH (ref 0–99)
NonHDL: 191.76
Total CHOL/HDL Ratio: 3
Triglycerides: 79 mg/dL (ref 0.0–149.0)
VLDL: 15.8 mg/dL (ref 0.0–40.0)

## 2023-12-30 MED ORDER — PANTOPRAZOLE SODIUM 40 MG PO TBEC
40.0000 mg | DELAYED_RELEASE_TABLET | Freq: Every day | ORAL | 3 refills | Status: AC
Start: 2023-12-30 — End: ?

## 2023-12-30 NOTE — Progress Notes (Signed)
 OFFICE VISIT  12/30/2023  CC:  Chief Complaint  Patient presents with   Medical Management of Chronic Issues    Pt is fasting    Patient is a 68 y.o. female who presents for 30-month follow-up of hypertension, hypercholesterolemia +obesity, and myofascial pain syndrome. A/P as of last visit: #1 history of myofascial pain. History of elevated sed rate. We had planned on prednisone  therapy for polymyalgia rheumatica a couple of months ago but her pain resolved without taking the medication.  She is currently feeling well. We will periodically monitor sed rate.   #2 hypertension, doing well on a irbesartan -HCTZ 150-12.5 tabs, 1 tab daily. Check electrolytes and creatinine today.  INTERIM HX: Home blood pressures normal. Still dealing with a lot of left knee pain.  Her orthopedist has increased her Celebrex  to 200 mg twice a day and she has restarted physical therapy.  She denies any side effects from the Celebrex .  Her myofascial pain has been waxing waning, similar to the pattern she has had for many years now. No headaches or vision abnormalities.  PMP AWARE reviewed today: most recent rx for phentermine  was filled 07/12/22, # 90, rx by me. No red flags.  ROS as above, plus--> no fevers, no CP, no SOB, no wheezing, no cough, no dizziness, no rashes, no melena/hematochezia.  No polyuria or polydipsia.   No focal weakness, paresthesias, or tremors.  No acute vision or hearing abnormalities.  No dysuria or unusual/new urinary urgency or frequency.  No recent changes in lower legs. No n/v/d or abd pain.  No palpitations.    Past Medical History:  Diagnosis Date   Allergic sinusitis 01/20/2014   Asthma    Borderline hyperlipidemia 2017/2018   Her 10 yr Framingham risk = 5% in 2018.  LDL improved 02/2017 (on no meds).  Fram risk 03/2018 4.5%. Fram risk 05/2020 6.3%.   Colon cancer screening 03/2016   2018 Cologuard negative.  Repeat 05/2019 Neg.  05/2022 NEG   Concussion with no loss  of consciousness 04/01/2008   Cough 03/03/2010   Qualifier: Diagnosis of   By: Eyvonne MD, Debby ORN        Depression    Hypertension    Maxillary sinusitis, acute 04/12/2015   Obesity    Osteoarthritis of knees, bilateral    mild/mod on plain films 09/2018.  L mod-to-severe 09/2021-->plan for TKA fall/winter 2024   Perennial allergic rhinitis    Polyarthralgia    Dr. Lenon did rheum w/u which was neg approx 2012--all this got better with wt loss   Polymyalgia rheumatica 2025   question of (hx elev sed rate in the approp clinical setting.  sx's resolved w/out steroids (spring 2025)   Right foot pain    Hammertoe, bunion, and dorsal spur to be treated surgically by podiatrist as of spring 2019   Sinusitis, acute 01/20/2014    Past Surgical History:  Procedure Laterality Date   BREAST BIOPSY     2016 benign   CESAREAN SECTION     x 2   Coronary calcium  score  12/2022   19 (58th%'tile)   DEXA  07/19/15; 11/27/18   2017: 'penia (T-Score -1.4).  11/2018 T score -1.7.  Plan rpt 2 yrs (her GYN has been doing these)   left foot surgery     2nd toe dislocation repair + bunion repair ([facility])   RETINAL LASER PROCEDURE Right 03/12/2019   RETINAL LASER PROCEDURE Left 03/26/2019   ROTATOR CUFF REPAIR  Approx 2009  Right   TOTAL KNEE ARTHROPLASTY Left    04/2023    Outpatient Medications Prior to Visit  Medication Sig Dispense Refill   albuterol  (VENTOLIN  HFA) 108 (90 Base) MCG/ACT inhaler TAKE 2 PUFFS BY MOUTH EVERY 6 HOURS AS NEEDED FOR WHEEZE OR SHORTNESS OF BREATH 18 each 1   beclomethasone (QVAR  REDIHALER) 80 MCG/ACT inhaler TAKE 2 PUFFS BY MOUTH TWICE A DAY 10.6 g 11   Calcium  Carbonate-Vitamin D 600-400 MG-UNIT chew tablet Chew 1 tablet by mouth daily.     celecoxib  (CELEBREX ) 200 MG capsule Take 1 capsule (200 mg total) by mouth daily. (Patient taking differently: Take 400 mg by mouth daily.) 30 capsule 3   cetirizine (ZYRTEC) 10 MG tablet Take 10 mg by mouth daily.      fluticasone  (FLONASE ) 50 MCG/ACT nasal spray Place 2 sprays into both nostrils daily. 16 g 6   gabapentin (NEURONTIN) 100 MG capsule Take 100 mg by mouth at bedtime.     irbesartan -hydrochlorothiazide  (AVALIDE) 150-12.5 MG tablet TAKE 1 TABLET BY MOUTH EVERY DAY 90 tablet 0   Multiple Vitamin (MULTIVITAMIN) capsule Take 1 capsule by mouth daily.     olopatadine (PATANOL) 0.1 % ophthalmic solution Place 1 drop into both eyes daily as needed.     Probiotic Product (PROBIOTIC GUMMIES PO) Take by mouth in the morning and at bedtime.     vitamin E 100 UNIT capsule Take 100 Units by mouth daily.     No facility-administered medications prior to visit.    Allergies  Allergen Reactions   Atorvastatin  Calcium  [Atorvastatin ] Other (See Comments)    Abdominal cramps   Augmentin  [Amoxicillin -Pot Clavulanate] Rash    Review of Systems As per HPI  PE:    12/30/2023   10:35 AM 11/20/2023    3:55 PM 06/27/2023    8:59 AM  Vitals with BMI  Height 5' 3.5  5' 3.5  Weight 187 lbs 13 oz 181 lbs 181 lbs 3 oz  BMI 32.74  31.59  Systolic 124  128  Diastolic 74  80  Pulse 71  79     Physical Exam  Gen: Alert, well appearing.  Patient is oriented to person, place, time, and situation. AFFECT: pleasant, lucid thought and speech. CV: RRR, no m/r/g.   LUNGS: CTA bilat, nonlabored resps, good aeration in all lung fields. EXT: no clubbing or cyanosis.  no edema.  No soft tissue tenderness today.  LABS:  Last CBC Lab Results  Component Value Date   WBC 4.8 06/27/2023   HGB 12.6 06/27/2023   HCT 39.8 06/27/2023   MCV 88.2 06/27/2023   MCH 27.9 06/27/2023   RDW 12.3 06/27/2023   PLT 272 06/27/2023   Last metabolic panel Lab Results  Component Value Date   GLUCOSE 80 06/27/2023   NA 139 06/27/2023   K 3.9 06/27/2023   CL 100 06/27/2023   CO2 32 06/27/2023   BUN 18 06/27/2023   CREATININE 0.66 06/27/2023   EGFR 95 06/27/2023   CALCIUM  9.7 06/27/2023   PROT 7.1 06/27/2023   ALBUMIN  3.3 (L) 12/03/2022   BILITOT 0.6 06/27/2023   ALKPHOS 65 12/03/2022   AST 14 06/27/2023   ALT 14 06/27/2023   Last lipids Lab Results  Component Value Date   CHOL 256 (H) 06/27/2023   HDL 73 06/27/2023   LDLCALC 160 (H) 06/27/2023   LDLDIRECT 146.6 09/15/2008   TRIG 113 06/27/2023   CHOLHDL 3.5 06/27/2023   Last hemoglobin A1c Lab Results  Component Value Date   HGBA1C 5.2 06/27/2023   Last thyroid  functions Lab Results  Component Value Date   TSH 1.72 05/18/2022   Lab Results  Component Value Date   ESRSEDRATE 34 (H) 06/27/2023   IMPRESSION AND PLAN:  1 history of myofascial pain. History of elevated sed rate. We had planned on prednisone  therapy for polymyalgia rheumatica about 6 months ago months ago but her pain resolved without taking the medication.  Her myofascial pain waxes and wanes in intensity the same way it has for many years.  #2 hypertension, doing well on a irbesartan -HCTZ 150-12.5 tabs, 1 tab daily. Check electrolytes and creatinine today.  #3 chronic NSAID therapy for chronic left knee pain. Orthopedist recently increased Celebrex  to 200 mg twice a day.  We discussed the long-term risks and benefits and decided to go ahead and continue with this plan with close monitoring of renal function (every 3 months minimum interval).  Also will start daily PPI--> pantoprazole 40 mg a day.  #4 hypercholesterolemia. We have held off on statin treatment but if LDL still the same on today's check we will go ahead and start atorvastatin .  An After Visit Summary was printed and given to the patient.  FOLLOW UP: Return in about 3 months (around 03/31/2024) for routine chronic illness f/u. Next CPE May 2026 Signed:  Gerlene Hockey, MD           12/30/2023

## 2023-12-31 ENCOUNTER — Other Ambulatory Visit: Payer: Self-pay

## 2023-12-31 ENCOUNTER — Telehealth: Payer: Self-pay

## 2023-12-31 DIAGNOSIS — E7800 Pure hypercholesterolemia, unspecified: Secondary | ICD-10-CM

## 2023-12-31 MED ORDER — EZETIMIBE 10 MG PO TABS: 10.0000 mg | ORAL_TABLET | Freq: Every day | ORAL | 2 refills | Status: DC

## 2023-12-31 NOTE — Telephone Encounter (Signed)
 Copied from CRM #[id number]. Topic: Clinical - Medication Question >> Dec 31, 2023  9:33 AM Berneda FALCON wrote: Reason for CRM: Patient states PCP wants her to start on atorvastatin  for high cholesterol but she was on this briefly last year and the side effects were awful for her and she does not wish to take this medication again. Would like to know if there is a different medication she can take instead of this one, please.  Patient callback is [phone]

## 2023-12-31 NOTE — Telephone Encounter (Signed)
 Okay, my mistake. Cancel atorvastatin . Please send in a prescription for Zetia 10 mg, 1 tab daily, #30, refill x 2.

## 2023-12-31 NOTE — Addendum Note (Signed)
 Addended by: CLAUDENE SHANDA ORN on: 12/31/2023 10:00 AM   Modules accepted: Orders

## 2024-01-15 DIAGNOSIS — H524 Presbyopia: Secondary | ICD-10-CM | POA: Diagnosis not present

## 2024-02-11 DIAGNOSIS — M25662 Stiffness of left knee, not elsewhere classified: Secondary | ICD-10-CM | POA: Diagnosis not present

## 2024-02-11 DIAGNOSIS — Z96652 Presence of left artificial knee joint: Secondary | ICD-10-CM | POA: Diagnosis not present

## 2024-02-12 ENCOUNTER — Encounter: Payer: Self-pay | Admitting: Family Medicine

## 2024-02-12 ENCOUNTER — Ambulatory Visit: Admitting: Family Medicine

## 2024-02-12 VITALS — BP 136/82 | HR 75 | Temp 97.1°F | Ht 63.5 in | Wt 185.2 lb

## 2024-02-12 DIAGNOSIS — H8113 Benign paroxysmal vertigo, bilateral: Secondary | ICD-10-CM | POA: Diagnosis not present

## 2024-02-12 NOTE — Progress Notes (Signed)
 OFFICE VISIT  02/12/2024  CC:  Chief Complaint  Patient presents with   Dizziness    Pt also c/o nausea;     Patient is a 68 y.o. female who presents for dizziness.  HPI: 5 days ago she woke up and rolled out of bed and felt immediate onset of spinning sensation and nausea.  It lasted a minute or so.  She laid back down and when she tried to get back up it happened again.  She waited and eventually got out of bed and the rest of the day had no problems.  Each morning up until today it has happened again--> morning vertigo and nausea and then fine the rest of the day.  She has no headaches, no tremor, no hearing changes, no ringing in the ears, no double vision, no focal weakness.  She has had similar vertigo in the past.  No fever, neck stiffness, rash, or mental status changes.  Past Medical History:  Diagnosis Date   Allergic sinusitis 01/20/2014   Allergy    Asthma    Borderline hyperlipidemia 2017/2018   Her 10 yr Framingham risk = 5% in 2018.  LDL improved 02/2017 (on no meds).  Fram risk 03/2018 4.5%. Fram risk 05/2020 6.3%.   Colon cancer screening 03/2016   2018 Cologuard negative.  Repeat 05/2019 Neg.  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  L mod-to-severe 09/2021-->plan for TKA fall/winter 2024   Perennial allergic rhinitis    Polyarthralgia    Dr. Lenon did rheum w/u which was neg approx 2012--all this got better with wt loss   Polymyalgia rheumatica 2025   question of (hx elev sed rate in the approp clinical setting.  sx's resolved w/out steroids (spring 2025)   Right foot pain    Hammertoe, bunion, and dorsal spur to be treated surgically by podiatrist as of spring 2019    Sinusitis, acute 01/20/2014    Past Surgical History:  Procedure Laterality Date   BREAST BIOPSY     2016 benign   CESAREAN SECTION     x 2   Coronary calcium  score  12/2022   19 (58th%'tile)   DEXA  07/19/15; 11/27/18   2017: 'penia (T-Score -1.4).  11/2018 T score -1.7.  Plan rpt 2 yrs (her GYN has been doing these)   left foot surgery     2nd toe dislocation repair + bunion repair ([facility])   RETINAL LASER PROCEDURE Right 03/12/2019   RETINAL LASER PROCEDURE Left 03/26/2019   ROTATOR CUFF REPAIR  Approx 2009   Right   TOTAL KNEE ARTHROPLASTY Left    04/2023    Outpatient Medications Prior to Visit  Medication Sig Dispense Refill   albuterol  (VENTOLIN  HFA) 108 (90 Base) MCG/ACT inhaler TAKE 2 PUFFS BY MOUTH EVERY 6 HOURS AS NEEDED FOR WHEEZE OR SHORTNESS OF BREATH (Patient taking differently: as needed.) 18 each 1   beclomethasone (QVAR  REDIHALER) 80 MCG/ACT inhaler TAKE 2 PUFFS BY MOUTH TWICE A DAY (Patient taking differently: as needed. TAKE 2 PUFFS BY MOUTH TWICE A DAY) 10.6 g 11   Calcium  Carbonate-Vitamin D 600-400  MG-UNIT chew tablet Chew 1 tablet by mouth daily.     celecoxib  (CELEBREX ) 200 MG capsule Take 1 capsule (200 mg total) by mouth daily. (Patient taking differently: Take 400 mg by mouth daily.) 30 capsule 3   cetirizine (ZYRTEC) 10 MG tablet Take 10 mg by mouth daily.     ezetimibe  (ZETIA ) 10 MG tablet Take 1 tablet (10 mg total) by mouth daily. 30 tablet 2   fluticasone  (FLONASE  ALLERGY RELIEF) 50 MCG/ACT nasal spray Place 1 spray into both nostrils daily.     irbesartan -hydrochlorothiazide  (AVALIDE) 150-12.5 MG tablet TAKE 1 TABLET BY MOUTH EVERY DAY 90 tablet 0   Multiple Vitamin (MULTIVITAMIN) capsule Take 1 capsule by mouth daily.     olopatadine (PATANOL) 0.1 % ophthalmic solution Place 1 drop into both eyes daily as needed.     OVER THE COUNTER MEDICATION Motion eaze drops     pantoprazole  (PROTONIX ) 40 MG tablet Take 1 tablet (40 mg total) by  mouth daily. 90 tablet 3   Probiotic Product (PROBIOTIC GUMMIES PO) Take by mouth in the morning and at bedtime.     vitamin E 100 UNIT capsule Take 100 Units by mouth daily.     Meclizine HCl 25 MG CHEW Chew 1 tablet by mouth as needed (dizziness). (Patient not taking: Reported on 02/12/2024)     fluticasone  (FLONASE ) 50 MCG/ACT nasal spray Place 2 sprays into both nostrils daily. 16 g 6   gabapentin (NEURONTIN) 100 MG capsule Take 100 mg by mouth at bedtime. (Patient not taking: Reported on 02/12/2024)     No facility-administered medications prior to visit.    Allergies[1]  Review of Systems  As per HPI  PE:    02/12/2024    3:52 PM 12/30/2023   10:35 AM 11/20/2023    3:55 PM  Vitals with BMI  Height 5' 3.5 5' 3.5   Weight 185 lbs 3 oz 187 lbs 13 oz 181 lbs  BMI 32.29 32.74   Systolic 136 124   Diastolic 82 74   Pulse 75 71      Physical Exam  General: Alert and well-appearing. Affect is pleasant, thought and speech are lucid. PERRLA, EOMI.  No lateral nystagmus. Neuro: CN 2-12 intact bilaterally, strength 5/5 in proximal and distal upper extremities and lower extremities bilaterally.    No tremor.  No disdiadochokinesis.  No ataxia.    No pronator drift. Dix-Hallpike: Head turned to right elicited severe vertigo and rotary nystagmus.  The nystagmus lasted approximately 20 seconds, the severe vertigo lasted approximately 40 seconds, and the nausea lasted about 1-1/2 to 2 minutes. Did not attempt other positional maneuvers after this.  LABS:  None  IMPRESSION AND PLAN:  BPPV, bilateral. Home Epley maneuvers reviewed, demonstrated with patient. No medications prescribed. She has an over-the-counter nausea medicine that she has found somewhat helpful.  An After Visit Summary was printed and given to the patient.  FOLLOW UP: Return if symptoms worsen or fail to improve in 5-7d.  Signed:  Gerlene Hockey, MD           02/12/2024     [1]  Allergies Allergen  Reactions   Atorvastatin  Calcium  [Atorvastatin ] Other (See Comments)    Abdominal cramps   Augmentin  [Amoxicillin -Pot Clavulanate] Rash

## 2024-02-14 DIAGNOSIS — Z96652 Presence of left artificial knee joint: Secondary | ICD-10-CM | POA: Diagnosis not present

## 2024-02-14 DIAGNOSIS — M25662 Stiffness of left knee, not elsewhere classified: Secondary | ICD-10-CM | POA: Diagnosis not present

## 2024-02-21 ENCOUNTER — Other Ambulatory Visit: Payer: Self-pay | Admitting: Family Medicine

## 2024-02-23 ENCOUNTER — Other Ambulatory Visit: Payer: Self-pay | Admitting: Family Medicine

## 2024-02-23 MED ORDER — SCOPOLAMINE 1 MG/3DAYS TD PT72: 1.0000 | MEDICATED_PATCH | TRANSDERMAL | 1 refills | Status: AC

## 2024-02-24 ENCOUNTER — Other Ambulatory Visit (HOSPITAL_COMMUNITY): Payer: Self-pay

## 2024-02-24 ENCOUNTER — Telehealth: Payer: Self-pay

## 2024-02-24 NOTE — Telephone Encounter (Signed)
 Pharmacy Patient Advocate Encounter   Received notification from Onbase that prior authorization for SCOPOLAMINE  1MG /3DAYS 72 HR PATCHES  is required/requested.   Insurance verification completed.   The patient is insured through [employer].   Per test claim: PA required; PA submitted to above mentioned insurance via Latent Key/confirmation #/EOC [id number] Status is pending

## 2024-02-26 ENCOUNTER — Other Ambulatory Visit (HOSPITAL_COMMUNITY): Payer: Self-pay

## 2024-02-26 NOTE — Telephone Encounter (Signed)
"  No further action needed at this time.  "

## 2024-02-26 NOTE — Telephone Encounter (Signed)
 Pharmacy Patient Advocate Encounter  Received notification from [employer] [facility] MEDICARE  that Prior Authorization for Scopolamine  1MG /3DAYS 72 hr patches  has been APPROVED from 02/25/2024 to 02/26/2024 see test bill below  PLEASE BE ADVISED I HAVE SENT PT MYCHART MESSAGE LETTING PT KNOW TO TRY AND GET THE PHARMACY TO RE PROCESS. I LEFT MESSAGE ON PHARMACY VM AS WELL   PA #/Case ID/Reference #: [id number]

## 2024-03-03 ENCOUNTER — Ambulatory Visit: Payer: Self-pay

## 2024-03-03 NOTE — Telephone Encounter (Signed)
 Pt scheduled for Friday. LVM to call if appointment needs to change

## 2024-03-03 NOTE — Telephone Encounter (Signed)
 Laura Green can you work her in on my schedule this week?

## 2024-03-03 NOTE — Telephone Encounter (Signed)
 Please Advise

## 2024-03-03 NOTE — Telephone Encounter (Signed)
 FYI Only or Action Required?: Action required by provider: request for appointment.  Patient was last seen in primary care on 02/12/2024 by McGowen, Aleene DEL, MD.  Called Nurse Triage reporting Rash.  Symptoms began several days ago.  Interventions attempted: Nothing.  Symptoms are: unchanged.  Triage Disposition: See HCP Within 4 Hours (Or PCP Triage)  Patient/caregiver understands and will follow disposition?: No, wishes to speak with PCP  Copied from CRM #[id number]. Topic: Clinical - Red Word Triage >> Mar 03, 2024 10:53 AM Macario HERO wrote: Red Word that prompted transfer to Nurse Triage: Patient called said since she started taking ezetimibe  (ZETIA ) 10 MG tablet [[id number]] she's experiencing chapped red lips with a rash, rash going down her neck and pain all over her body.  12/31/23 started zetia ; started 01/02/24; 3 weeks around lips red, cracked lips, rash to  Neck itchy and blotchy, nasal congestion, mucus yellow with blood streaks  Redness, flat,   Denies fever, n/v/d Diff breathing, chest pain, chills Reason for Disposition  Face or lip swelling  Answer Assessment - Initial Assessment Questions No available appts with pcp. Offered appt with alt prov, patient declines. Patient requests appt with Dr. McGowen.  Advised ED/911 if symptoms worsen: severe diff breathing, chest pain > 5 min, faint, facial/tongue swelling, spread rash. Patient verbalized understanding.  1. APPEARANCE of RASH: What does the rash look like? (e.g., spots, blisters, raised areas, skin peeling, scaly)     Scaly, brownish 2. SIZE: How big are the spots? (e.g., tip of pen, eraser, coin; inches, centimeters)     Eraser of pencil 3. LOCATION: Where is the rash located?     Lips and neck rash 4 weeks ago, lip swelling; severe; week ago 4. COLOR: What color is the rash? (Note: It is difficult to assess rash color in people with darker-colored skin. When this situation occurs, simply ask the caller to  describe what they see.)     Brownish, flat 5. ONSET: When did the rash begin?     01/02/24 6. FEVER: Do you have a fever? If Yes, ask: What is your temperature, how was it measured, and when did it start?     denies 7. ITCHING: Does the rash itch? If Yes, ask: How bad is the itch? (Scale 1-10; or mild, moderate, severe)     severe 8. CAUSE: What do you think is causing the rash?     Zetia  9. NEW MEDICINES: What new medicines are you taking? (e.g., name of antibiotic) When did you start taking this medication?.     Zetia ; taking medication daily 10. OTHER SYMPTOMS: Do you have any other symptoms? (e.g., sore throat, fever, joint pain)    Denies diff breathing, chest pain, faint, fever chills n/v/d  Protocols used: Rash - Widespread On Drugs-A-AH

## 2024-03-06 ENCOUNTER — Ambulatory Visit: Admitting: Family Medicine

## 2024-03-06 ENCOUNTER — Encounter: Payer: Self-pay | Admitting: Family Medicine

## 2024-03-06 ENCOUNTER — Other Ambulatory Visit: Payer: Self-pay | Admitting: Family Medicine

## 2024-03-06 VITALS — BP 124/76 | HR 65 | Temp 97.5°F | Ht 63.5 in | Wt 183.6 lb

## 2024-03-06 DIAGNOSIS — L710 Perioral dermatitis: Secondary | ICD-10-CM

## 2024-03-06 DIAGNOSIS — E7800 Pure hypercholesterolemia, unspecified: Secondary | ICD-10-CM

## 2024-03-06 MED ORDER — FEXOFENADINE HCL 180 MG PO TABS: 180.0000 mg | ORAL_TABLET | Freq: Every day | ORAL | 3 refills | Status: AC

## 2024-03-06 NOTE — Progress Notes (Signed)
 OFFICE VISIT  03/06/2024  CC:  Chief Complaint  Patient presents with   Rash    Onset 2 months ago; rash on neck size of pencil eraser; nasal cong;yellow phlegm w/ blood; denies fever, n/v/d, diff breathing, chest pain or chills    Patient is a 69 y.o. female who presents for rash.  HPI: Laura Green recently had some itchy pink bumps/blisters around her lips.  She did not have generalized lip swelling, though.  No swelling of the eyes, tongue, gingiva, or throat. She describes an itchy sensation of the front of her neck but no rash in that area.  Also had some recent itchy rash on the forearms yesterday but this is resolved today. She started applying Aquaphor to her lips and the rash has resolved now.  She has a long history of many environmental allergens and has been on Zyrtec for years.  Past Medical History:  Diagnosis Date   Allergic sinusitis 01/20/2014   Allergy    Asthma    Borderline hyperlipidemia 2017/2018   Her 10 yr Framingham risk = 5% in 2018.  LDL improved 02/2017 (on no meds).  Fram risk 03/2018 4.5%. Fram risk 05/2020 6.3%.   Colon cancer screening 03/2016   2018 Cologuard negative.  Repeat 05/2019 Neg.  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  L mod-to-severe 09/2021-->plan for TKA fall/winter 2024   Perennial allergic rhinitis    Polyarthralgia    Dr. Lenon did rheum w/u which was neg approx 2012--all this got better with wt loss   Polymyalgia rheumatica 2025   question of (hx elev sed rate in the approp clinical setting.  sx's resolved w/out steroids (spring 2025)   Right foot pain    Hammertoe, bunion, and dorsal spur to be treated surgically by podiatrist as of spring  2019   Sinusitis, acute 01/20/2014    Past Surgical History:  Procedure Laterality Date   BREAST BIOPSY     2016 benign   CESAREAN SECTION     x 2   Coronary calcium  score  12/2022   19 (58th%'tile)   DEXA  07/19/15; 11/27/18   2017: 'penia (T-Score -1.4).  11/2018 T score -1.7.  Plan rpt 2 yrs (her GYN has been doing these)   left foot surgery     2nd toe dislocation repair + bunion repair ([facility])   RETINAL LASER PROCEDURE Right 03/12/2019   RETINAL LASER PROCEDURE Left 03/26/2019   ROTATOR CUFF REPAIR  Approx 2009   Right   TOTAL KNEE ARTHROPLASTY Left    04/2023    Outpatient Medications Prior to Visit  Medication Sig Dispense Refill   albuterol  (VENTOLIN  HFA) 108 (90 Base) MCG/ACT inhaler TAKE 2 PUFFS BY MOUTH EVERY 6 HOURS AS NEEDED FOR WHEEZE OR SHORTNESS OF BREATH (Patient taking differently: as needed.) 18 each 1   beclomethasone (QVAR  REDIHALER) 80 MCG/ACT inhaler TAKE 2 PUFFS BY MOUTH TWICE A DAY (Patient taking differently: as needed. TAKE 2 PUFFS BY MOUTH TWICE A DAY) 10.6 g 11   Calcium  Carbonate-Vitamin D 600-400 MG-UNIT chew tablet Chew 1 tablet by mouth daily.  celecoxib  (CELEBREX ) 200 MG capsule Take 1 capsule (200 mg total) by mouth daily. (Patient taking differently: Take 400 mg by mouth daily.) 30 capsule 3   cetirizine (ZYRTEC) 10 MG tablet Take 10 mg by mouth daily.     ezetimibe  (ZETIA ) 10 MG tablet Take 1 tablet (10 mg total) by mouth daily. 30 tablet 2   fluticasone  (FLONASE  ALLERGY RELIEF) 50 MCG/ACT nasal spray Place 1 spray into both nostrils daily.     irbesartan -hydrochlorothiazide  (AVALIDE) 150-12.5 MG tablet TAKE 1 TABLET BY MOUTH EVERY DAY 90 tablet 0   Multiple Vitamin (MULTIVITAMIN) capsule Take 1 capsule by mouth daily.     olopatadine (PATANOL) 0.1 % ophthalmic solution Place 1 drop into both eyes daily as needed.     OVER THE COUNTER MEDICATION Motion eaze drops     pantoprazole  (PROTONIX ) 40 MG tablet Take 1 tablet (40 mg total)  by mouth daily. 90 tablet 3   Probiotic Product (PROBIOTIC GUMMIES PO) Take by mouth in the morning and at bedtime.     scopolamine  (TRANSDERM-SCOP) 1 MG/3DAYS Place 1 patch (1 mg total) onto the skin every 3 (three) days. 10 patch 1   vitamin E 100 UNIT capsule Take 100 Units by mouth daily.     Meclizine HCl 25 MG CHEW Chew 1 tablet by mouth as needed (dizziness). (Patient not taking: Reported on 03/06/2024)     No facility-administered medications prior to visit.    Allergies[1]  Review of Systems  As per HPI  PE:    03/06/2024   10:51 AM 02/12/2024    3:52 PM 12/30/2023   10:35 AM  Vitals with BMI  Height 5' 3.5 5' 3.5 5' 3.5  Weight 183 lbs 10 oz 185 lbs 3 oz 187 lbs 13 oz  BMI 32.01 32.29 32.74  Systolic 124 136 875  Diastolic 76 82 74  Pulse 65 75 71     Physical Exam  General: Alert and well. Lips without focal lesion or erythema or swelling.  No fissuring. Tongue and oropharynx normal. Neck without lymphadenopathy or thyromegaly or rash. Arms without rash.   LABS:  Last CBC Lab Results  Component Value Date   WBC 4.8 06/27/2023   HGB 12.6 06/27/2023   HCT 39.8 06/27/2023   MCV 88.2 06/27/2023   MCH 27.9 06/27/2023   RDW 12.3 06/27/2023   PLT 272 06/27/2023   Last metabolic panel Lab Results  Component Value Date   GLUCOSE 78 12/30/2023   NA 143 12/30/2023   K 4.1 12/30/2023   CL 103 12/30/2023   CO2 33 (H) 12/30/2023   BUN 20 12/30/2023   CREATININE 0.64 12/30/2023   GFR 90.36 12/30/2023   CALCIUM  9.2 12/30/2023   PROT 7.1 06/27/2023   ALBUMIN 3.3 (L) 12/03/2022   BILITOT 0.6 06/27/2023   ALKPHOS 65 12/03/2022   AST 14 06/27/2023   ALT 14 06/27/2023   Last lipids Lab Results  Component Value Date   CHOL 272 (H) 12/30/2023   HDL 79.90 12/30/2023   LDLCALC 176 (H) 12/30/2023   LDLDIRECT 146.6 09/15/2008   TRIG 79.0 12/30/2023   CHOLHDL 3 12/30/2023   Last hemoglobin A1c Lab Results  Component Value Date   HGBA1C 5.2 06/27/2023    IMPRESSION AND PLAN:  #1 perioral dermatitis. Unknown trigger. Resolving/resolved with use of Aquaphor.  She has a long history of allergic rhinitis as well as some significant periods of allergic conjunctivitis and sinusitis as well as allergic rashes--> has been on Zyrtec  for many years. Will switch her to Allegra  180 mg a day.  #2 hypercholesterolemia.  She started Zetia  approximately 2 months ago.  She will be returning for a lab visit in about 1 month for recheck lipid panel.  An After Visit Summary was printed and given to the patient.  FOLLOW UP: No follow-ups on file.  Signed:  Phil Savon Cobbs, MD           03/06/2024     [1]  Allergies Allergen Reactions   Atorvastatin  Calcium  [Atorvastatin ] Other (See Comments)    Abdominal cramps   Augmentin  [Amoxicillin -Pot Clavulanate] Rash

## 2024-03-13 ENCOUNTER — Other Ambulatory Visit: Payer: Self-pay | Admitting: Family Medicine

## 2024-03-13 DIAGNOSIS — E7800 Pure hypercholesterolemia, unspecified: Secondary | ICD-10-CM

## 2024-03-21 ENCOUNTER — Other Ambulatory Visit: Payer: Self-pay | Admitting: Family Medicine

## 2024-04-01 ENCOUNTER — Ambulatory Visit: Admitting: Family Medicine

## 2024-04-01 ENCOUNTER — Other Ambulatory Visit

## 2024-04-01 DIAGNOSIS — E7800 Pure hypercholesterolemia, unspecified: Secondary | ICD-10-CM | POA: Diagnosis not present

## 2024-04-01 LAB — LIPID PANEL
Cholesterol: 220 mg/dL — ABNORMAL HIGH (ref 28–200)
HDL: 73.8 mg/dL
LDL Cholesterol: 133 mg/dL — ABNORMAL HIGH (ref 10–99)
NonHDL: 145.89
Total CHOL/HDL Ratio: 3
Triglycerides: 64 mg/dL (ref 10.0–149.0)
VLDL: 12.8 mg/dL (ref 0.0–40.0)

## 2024-04-01 LAB — AST: AST: 14 U/L (ref 5–37)

## 2024-04-01 LAB — ALT: ALT: 14 U/L (ref 3–35)

## 2024-04-02 ENCOUNTER — Ambulatory Visit: Payer: Self-pay | Admitting: Family Medicine

## 2024-11-25 ENCOUNTER — Ambulatory Visit
